# Patient Record
Sex: Female | Born: 1955
Health system: Southern US, Community
[De-identification: ages and names within clinical notes are randomized; demographics above are authoritative.]

## PROBLEM LIST (undated history)

## (undated) DIAGNOSIS — R300 Dysuria: Secondary | ICD-10-CM

## (undated) DIAGNOSIS — R3915 Urgency of urination: Secondary | ICD-10-CM

## (undated) DIAGNOSIS — H9190 Unspecified hearing loss, unspecified ear: Secondary | ICD-10-CM

## (undated) DIAGNOSIS — M1990 Unspecified osteoarthritis, unspecified site: Secondary | ICD-10-CM

## (undated) DIAGNOSIS — E079 Disorder of thyroid, unspecified: Secondary | ICD-10-CM

## (undated) DIAGNOSIS — IMO0002 Reserved for concepts with insufficient information to code with codable children: Secondary | ICD-10-CM

## (undated) DIAGNOSIS — N952 Postmenopausal atrophic vaginitis: Secondary | ICD-10-CM

## (undated) DIAGNOSIS — E785 Hyperlipidemia, unspecified: Secondary | ICD-10-CM

## (undated) DIAGNOSIS — E119 Type 2 diabetes mellitus without complications: Secondary | ICD-10-CM

## (undated) DIAGNOSIS — K589 Irritable bowel syndrome without diarrhea: Secondary | ICD-10-CM

## (undated) DIAGNOSIS — M810 Age-related osteoporosis without current pathological fracture: Secondary | ICD-10-CM

## (undated) DIAGNOSIS — R112 Nausea with vomiting, unspecified: Secondary | ICD-10-CM

## (undated) DIAGNOSIS — Z9889 Other specified postprocedural states: Secondary | ICD-10-CM

## (undated) DIAGNOSIS — E039 Hypothyroidism, unspecified: Secondary | ICD-10-CM

## (undated) DIAGNOSIS — R350 Frequency of micturition: Secondary | ICD-10-CM

## (undated) DIAGNOSIS — J302 Other seasonal allergic rhinitis: Secondary | ICD-10-CM

## (undated) DIAGNOSIS — N8189 Other female genital prolapse: Secondary | ICD-10-CM

## (undated) DIAGNOSIS — N950 Postmenopausal bleeding: Secondary | ICD-10-CM

## (undated) HISTORY — PX: KNEE ARTHROSCOPY: SHX127

## (undated) HISTORY — DX: Postmenopausal bleeding: N95.0

## (undated) HISTORY — PX: CARPAL TUNNEL RELEASE: SHX101

## (undated) HISTORY — DX: Age-related osteoporosis without current pathological fracture: M81.0

## (undated) HISTORY — DX: Other female genital prolapse: N81.89

## (undated) HISTORY — PX: COLONOSCOPY: SHX174

## (undated) HISTORY — PX: EYE SURGERY: SHX253

## (undated) HISTORY — DX: Type 2 diabetes mellitus without complications: E11.9

## (undated) HISTORY — DX: Reserved for concepts with insufficient information to code with codable children: IMO0002

## (undated) HISTORY — DX: Postmenopausal atrophic vaginitis: N95.2

## (undated) HISTORY — DX: Frequency of micturition: R35.0

## (undated) HISTORY — PX: TUBAL LIGATION: SHX77

## (undated) HISTORY — PX: WISDOM TOOTH EXTRACTION: SHX21

## (undated) HISTORY — DX: Urgency of urination: R39.15

## (undated) HISTORY — DX: Disorder of thyroid, unspecified: E07.9

## (undated) HISTORY — DX: Dysuria: R30.0

## (undated) HISTORY — PX: ABDOMINAL HYSTERECTOMY: SHX81

---

## 1982-12-05 HISTORY — PX: OTHER SURGICAL HISTORY: SHX169

## 1998-06-19 ENCOUNTER — Other Ambulatory Visit: Admission: RE | Admit: 1998-06-19 | Discharge: 1998-06-19 | Payer: Self-pay | Admitting: Obstetrics & Gynecology

## 1998-06-25 ENCOUNTER — Ambulatory Visit (HOSPITAL_COMMUNITY): Admission: RE | Admit: 1998-06-25 | Discharge: 1998-06-25 | Payer: Self-pay | Admitting: Orthopedic Surgery

## 1998-06-29 ENCOUNTER — Other Ambulatory Visit: Admission: RE | Admit: 1998-06-29 | Discharge: 1998-06-29 | Payer: Self-pay | Admitting: Obstetrics & Gynecology

## 1998-06-29 HISTORY — PX: ENDOMETRIAL BIOPSY: SHX622

## 1999-08-03 ENCOUNTER — Other Ambulatory Visit: Admission: RE | Admit: 1999-08-03 | Discharge: 1999-08-03 | Payer: Self-pay | Admitting: Obstetrics & Gynecology

## 1999-08-18 ENCOUNTER — Encounter: Payer: Self-pay | Admitting: Obstetrics & Gynecology

## 1999-08-18 ENCOUNTER — Ambulatory Visit (HOSPITAL_COMMUNITY): Admission: RE | Admit: 1999-08-18 | Discharge: 1999-08-18 | Payer: Self-pay | Admitting: Obstetrics & Gynecology

## 1999-12-21 ENCOUNTER — Other Ambulatory Visit: Admission: RE | Admit: 1999-12-21 | Discharge: 1999-12-21 | Payer: Self-pay | Admitting: Obstetrics & Gynecology

## 2000-07-31 ENCOUNTER — Encounter: Payer: Self-pay | Admitting: Obstetrics & Gynecology

## 2000-07-31 ENCOUNTER — Ambulatory Visit (HOSPITAL_COMMUNITY): Admission: RE | Admit: 2000-07-31 | Discharge: 2000-07-31 | Payer: Self-pay | Admitting: *Deleted

## 2001-08-02 ENCOUNTER — Encounter: Payer: Self-pay | Admitting: Obstetrics and Gynecology

## 2001-08-02 ENCOUNTER — Ambulatory Visit (HOSPITAL_COMMUNITY): Admission: RE | Admit: 2001-08-02 | Discharge: 2001-08-02 | Payer: Self-pay | Admitting: Obstetrics and Gynecology

## 2001-09-18 ENCOUNTER — Other Ambulatory Visit: Admission: RE | Admit: 2001-09-18 | Discharge: 2001-09-18 | Payer: Self-pay | Admitting: Obstetrics and Gynecology

## 2003-08-28 ENCOUNTER — Other Ambulatory Visit: Admission: RE | Admit: 2003-08-28 | Discharge: 2003-08-28 | Payer: Self-pay | Admitting: Obstetrics and Gynecology

## 2003-09-01 ENCOUNTER — Encounter: Payer: Self-pay | Admitting: Obstetrics and Gynecology

## 2003-09-01 ENCOUNTER — Ambulatory Visit (HOSPITAL_COMMUNITY): Admission: RE | Admit: 2003-09-01 | Discharge: 2003-09-01 | Payer: Self-pay | Admitting: Obstetrics and Gynecology

## 2004-09-14 ENCOUNTER — Ambulatory Visit (HOSPITAL_COMMUNITY): Admission: RE | Admit: 2004-09-14 | Discharge: 2004-09-14 | Payer: Self-pay | Admitting: Family Medicine

## 2005-05-03 ENCOUNTER — Other Ambulatory Visit: Admission: RE | Admit: 2005-05-03 | Discharge: 2005-05-03 | Payer: Self-pay | Admitting: Obstetrics and Gynecology

## 2005-09-27 ENCOUNTER — Ambulatory Visit (HOSPITAL_COMMUNITY): Admission: RE | Admit: 2005-09-27 | Discharge: 2005-09-27 | Payer: Self-pay | Admitting: Family Medicine

## 2006-05-04 ENCOUNTER — Other Ambulatory Visit: Admission: RE | Admit: 2006-05-04 | Discharge: 2006-05-04 | Payer: Self-pay | Admitting: Obstetrics and Gynecology

## 2006-09-28 ENCOUNTER — Ambulatory Visit (HOSPITAL_COMMUNITY): Admission: RE | Admit: 2006-09-28 | Discharge: 2006-09-28 | Payer: Self-pay | Admitting: Family Medicine

## 2006-10-13 ENCOUNTER — Encounter: Admission: RE | Admit: 2006-10-13 | Discharge: 2006-10-13 | Payer: Self-pay | Admitting: Family Medicine

## 2006-10-13 IMAGING — MG MM DIAGNOSTIC LTD LEFT
3 series · 3 of 3 positions shown · non-contrast
Comparison: Previous examinations, including the screening mammogram dated [DATE].

[REDACTED] LEFT
CC and MLO view(s) were taken of the left breast.

DIGITAL LIMITED LEFT DIAGNOSTIC MAMMOGRAM:
CLINICAL DATA: Possible left breast mass at recent screening mammography.

[L CC (1 of 2)]
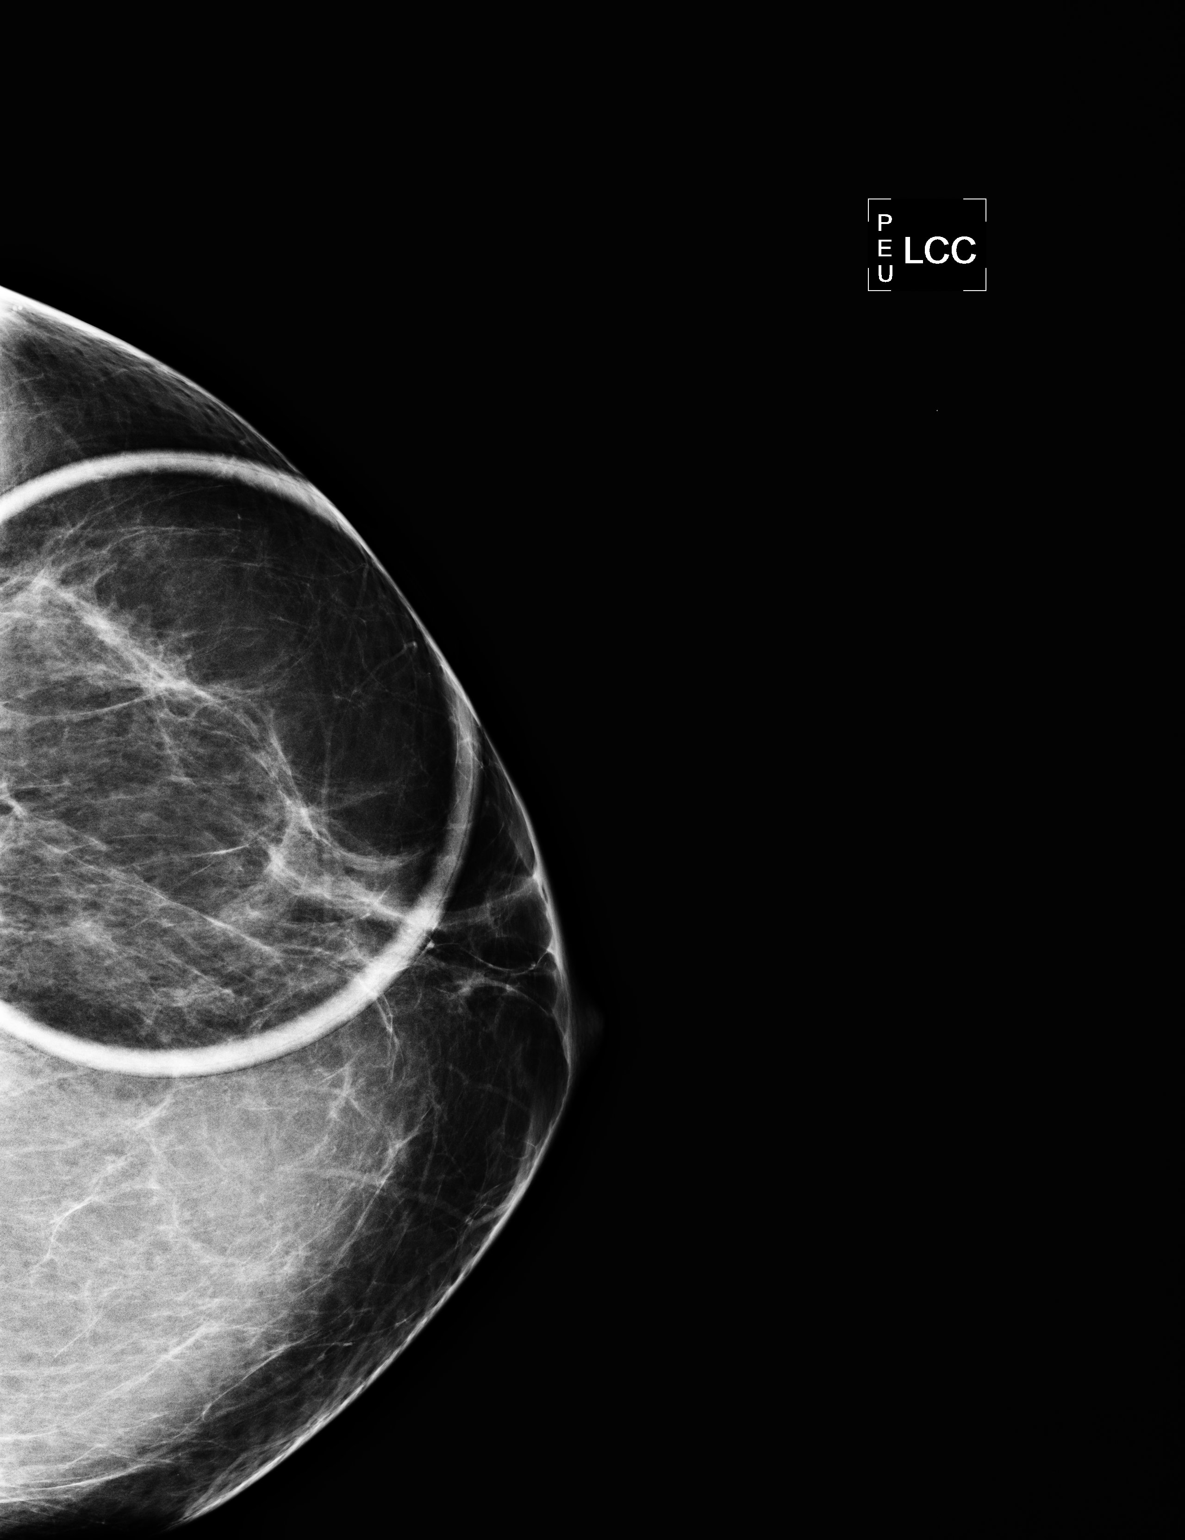

[L MLO]
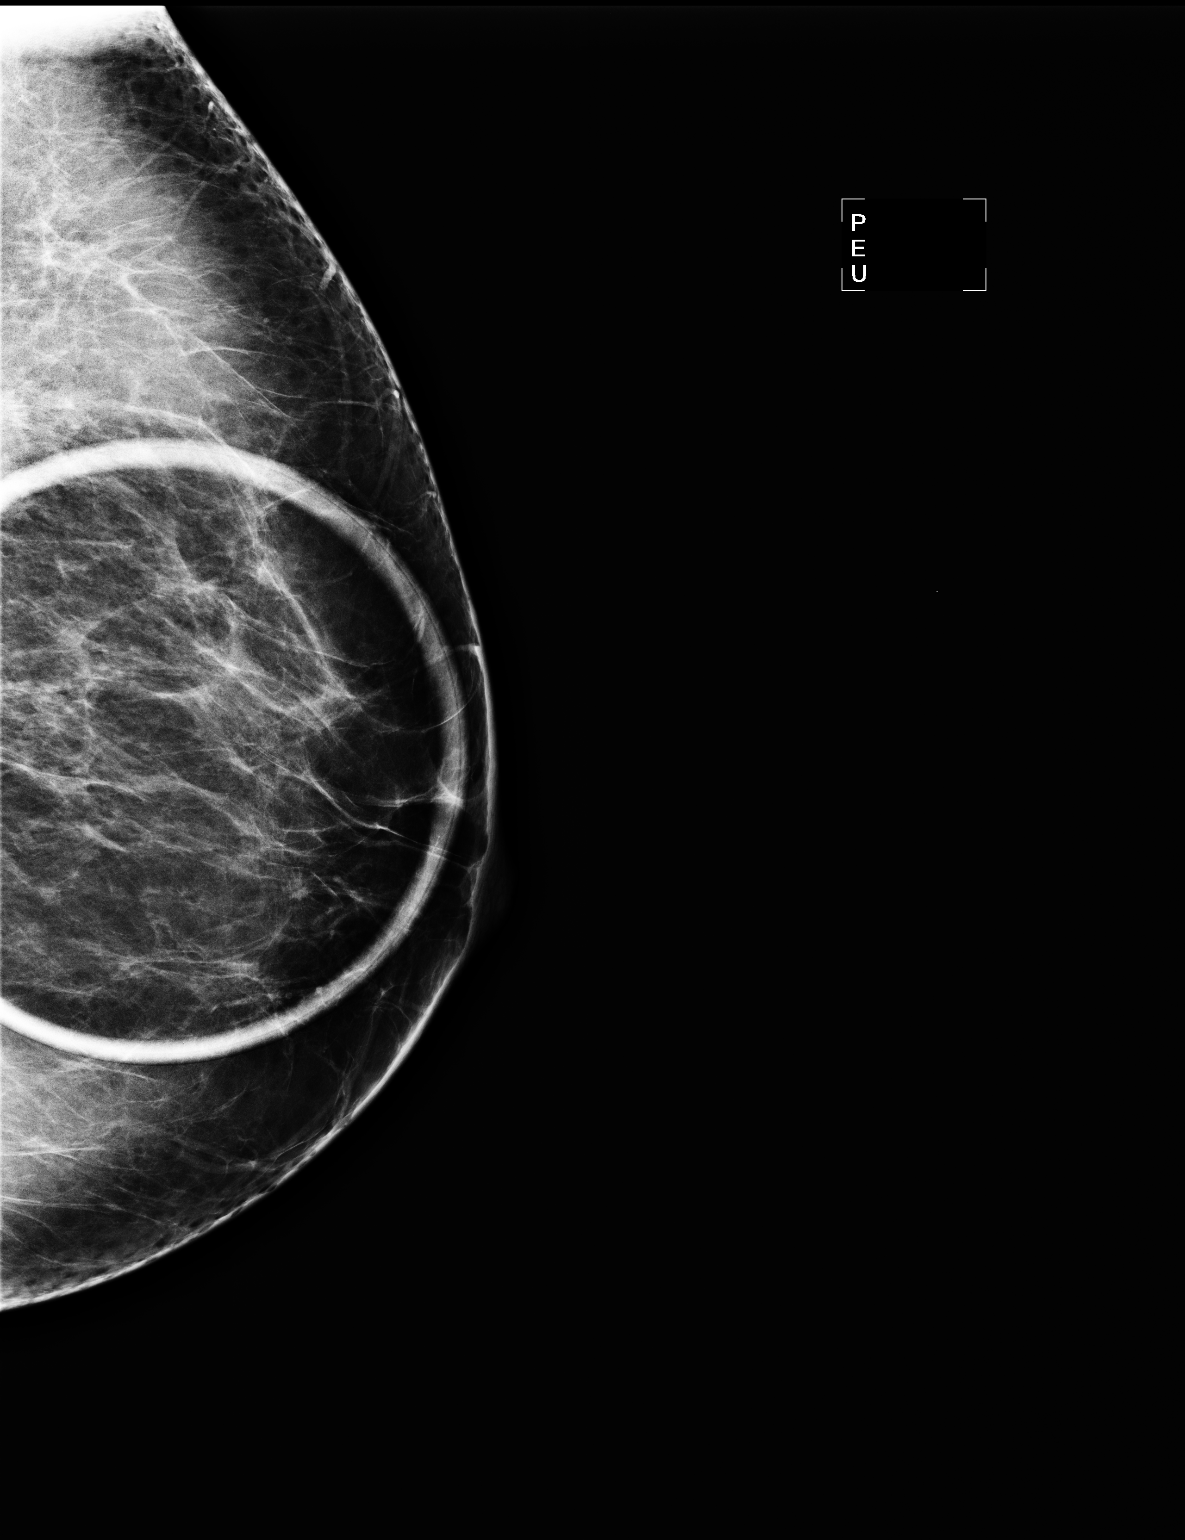

[L CC (2 of 2)]
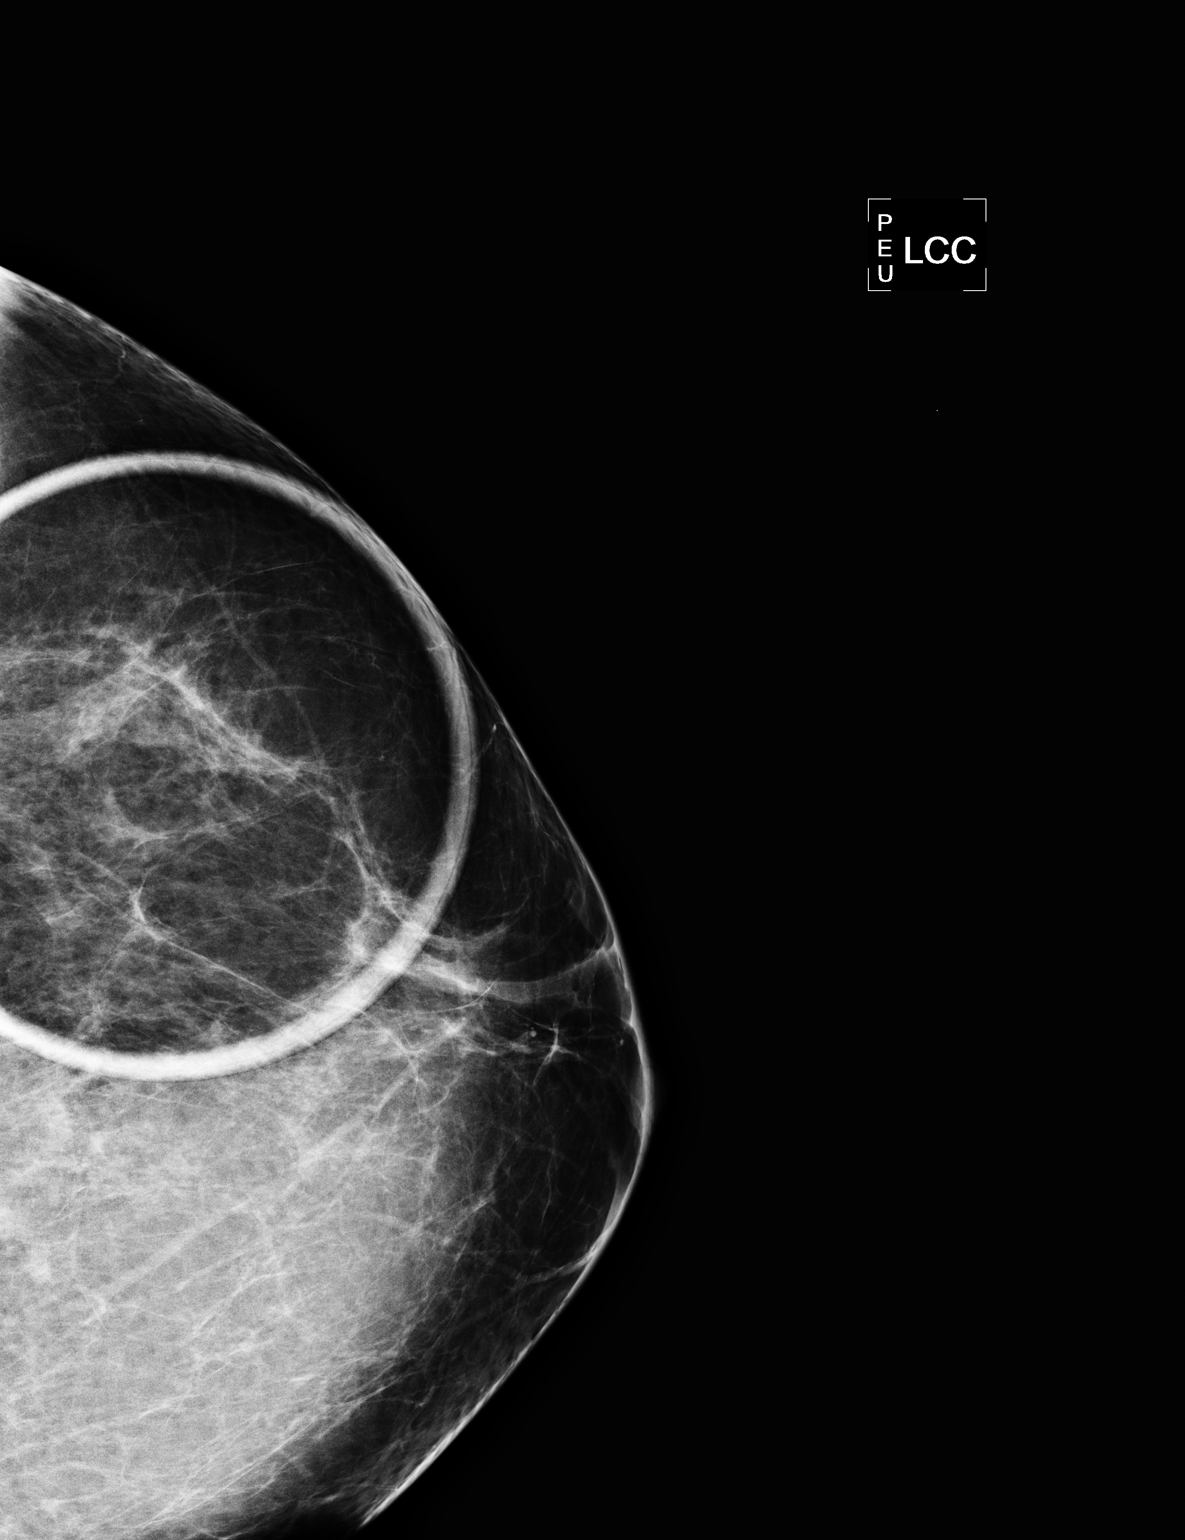

[3 of 3 positions shown; findings below may reference images not displayed]

Spot compression craniocaudal and spot  compression oblique views of the left breast demonstrate 
normal appearing parenchyma at the location of the recently suspected mass, unchanged from previous
examinations.  No true mass or other findings suspicious for malignancy.
IMPRESSION: No evidence of malignancy.  The recently suspected left breast mass represented overlapping of 
normal breast tissue.  Screening mammography is recommended in one year.

ASSESSMENT: Negative - BI-RADS 1

Screening mammogram of both breasts in 1 year.
, THIS PROCEDURE WAS A DIGITAL MAMMOGRAM.

## 2006-10-22 ENCOUNTER — Emergency Department (HOSPITAL_COMMUNITY): Admission: EM | Admit: 2006-10-22 | Discharge: 2006-10-22 | Payer: Self-pay | Admitting: Emergency Medicine

## 2006-10-22 IMAGING — CR DG CHEST 1V PORT
1 series · 1 of 1 positions shown · non-contrast
Comparison: none

CLINICAL DATA: Chest pain.  Palpitations and tachycardia.
 PORTABLE CHEST - 1 VIEW:

[view not recorded]
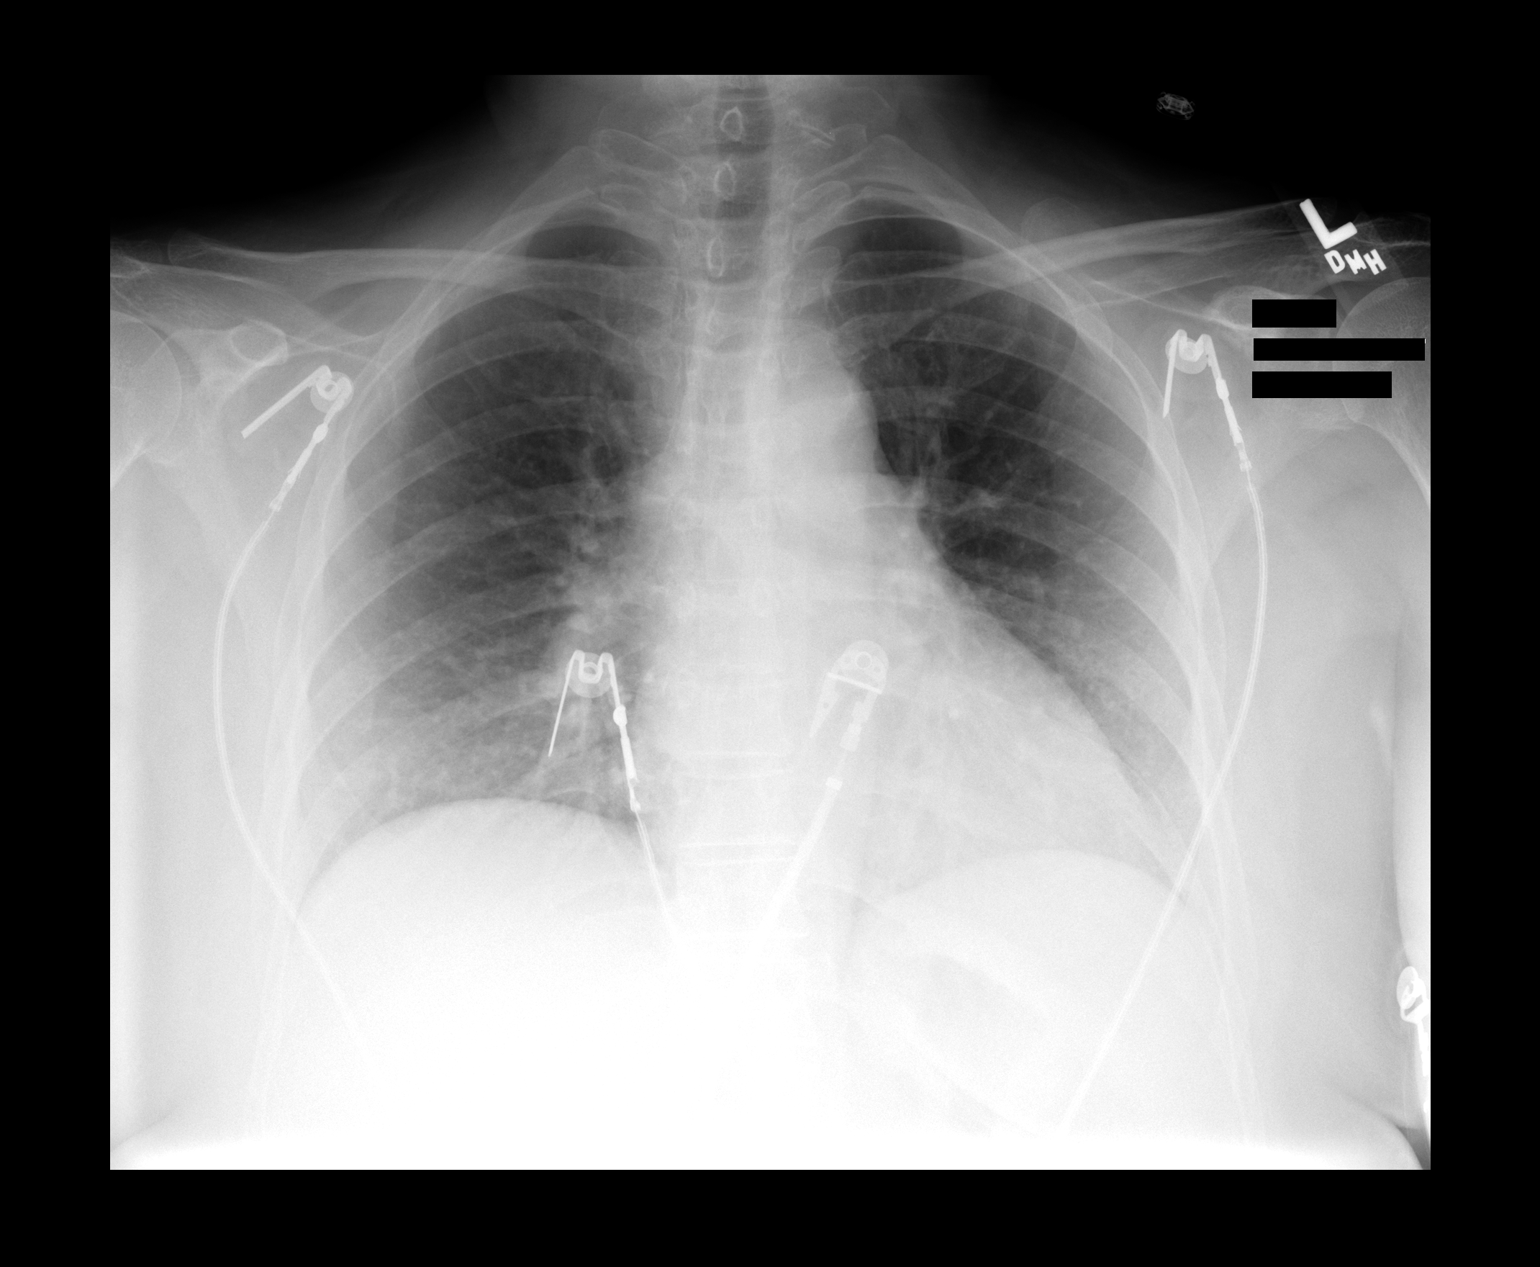

[1 of 1 positions shown; findings below may reference images not displayed]

FINDINGS: The heart size and mediastinal contours are within normal limits.  Both lungs are clear.
IMPRESSION: No acute findings.

## 2007-10-03 ENCOUNTER — Encounter: Admission: RE | Admit: 2007-10-03 | Discharge: 2007-10-03 | Payer: Self-pay | Admitting: Family Medicine

## 2007-10-03 IMAGING — MG MM SCREENING MAMMOGRAM
5 series · 5 of 5 positions shown · non-contrast
Comparison: none

DG SCREEN MAMMOGRAM BILATERAL
Bilateral CC and MLO view(s) were taken.

DIGITAL SCREENING MAMMOGRAM WITH CAD:
There are scattered fibroglandular densities.  No masses or malignant type calcifications are 
identified.  Compared with prior studies.

[R CC]
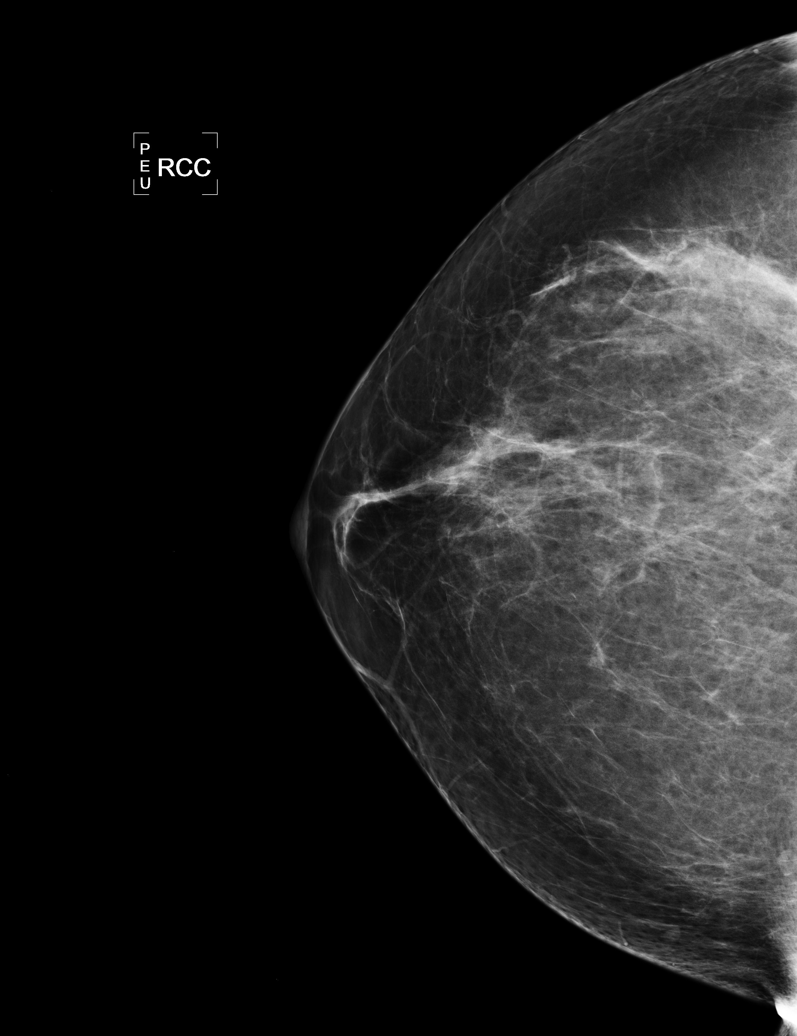

[L CC]
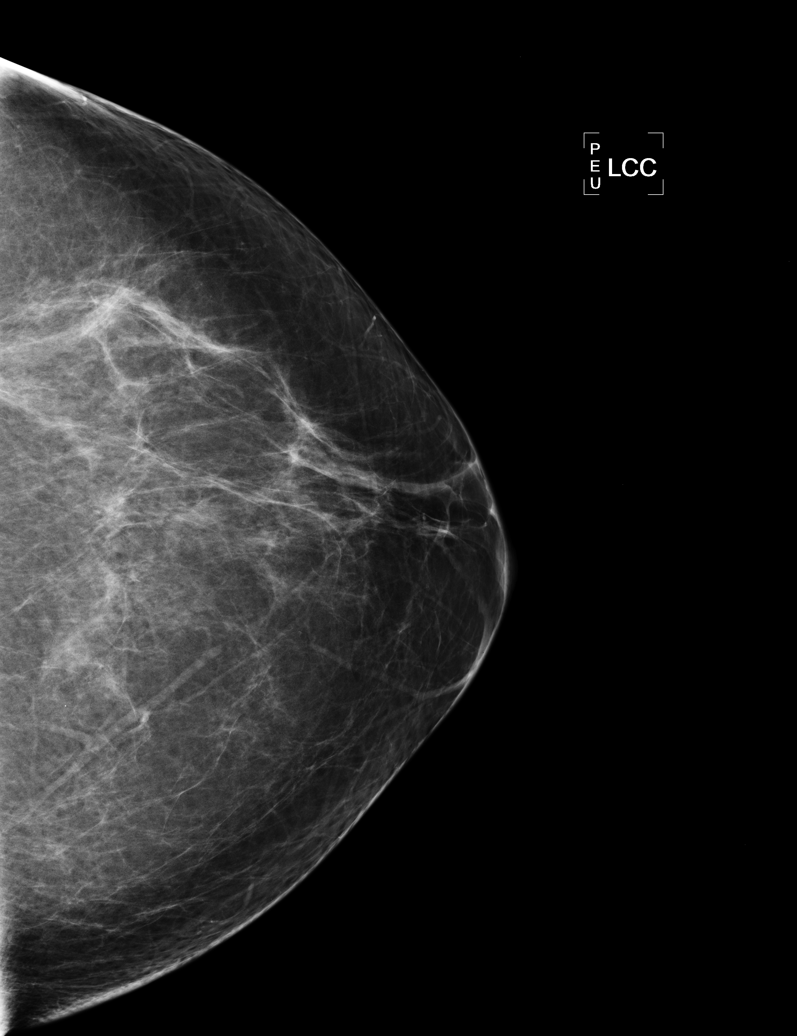

[L MLO (1 of 2)]
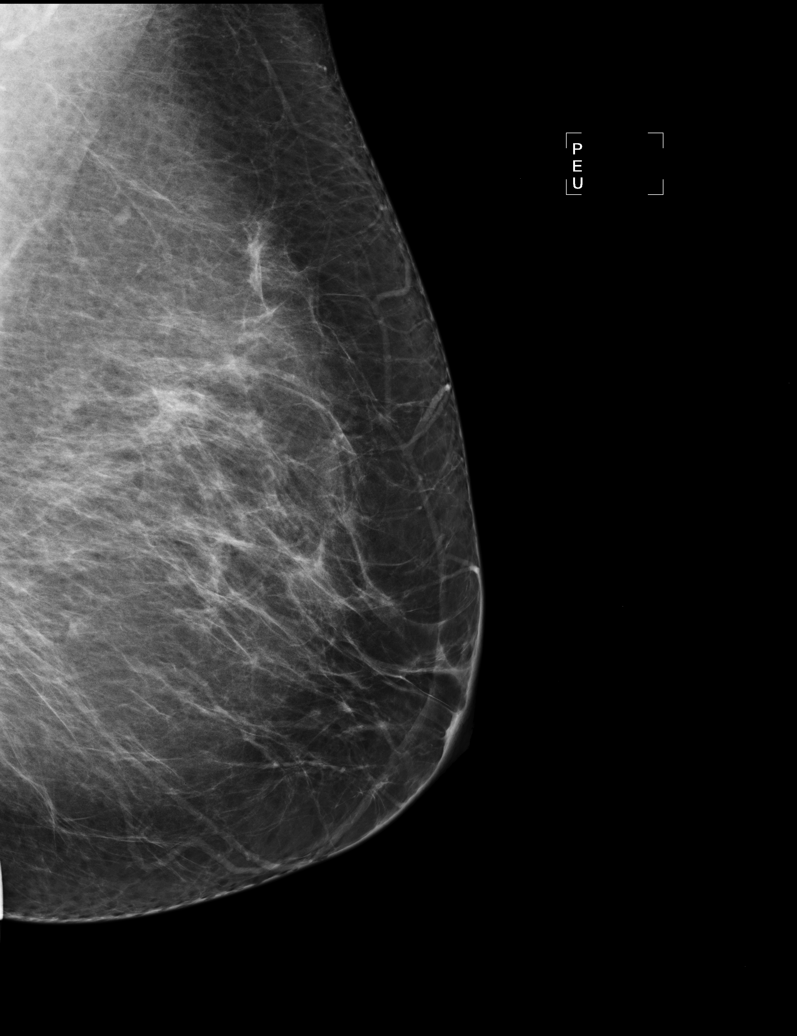

[R MLO]
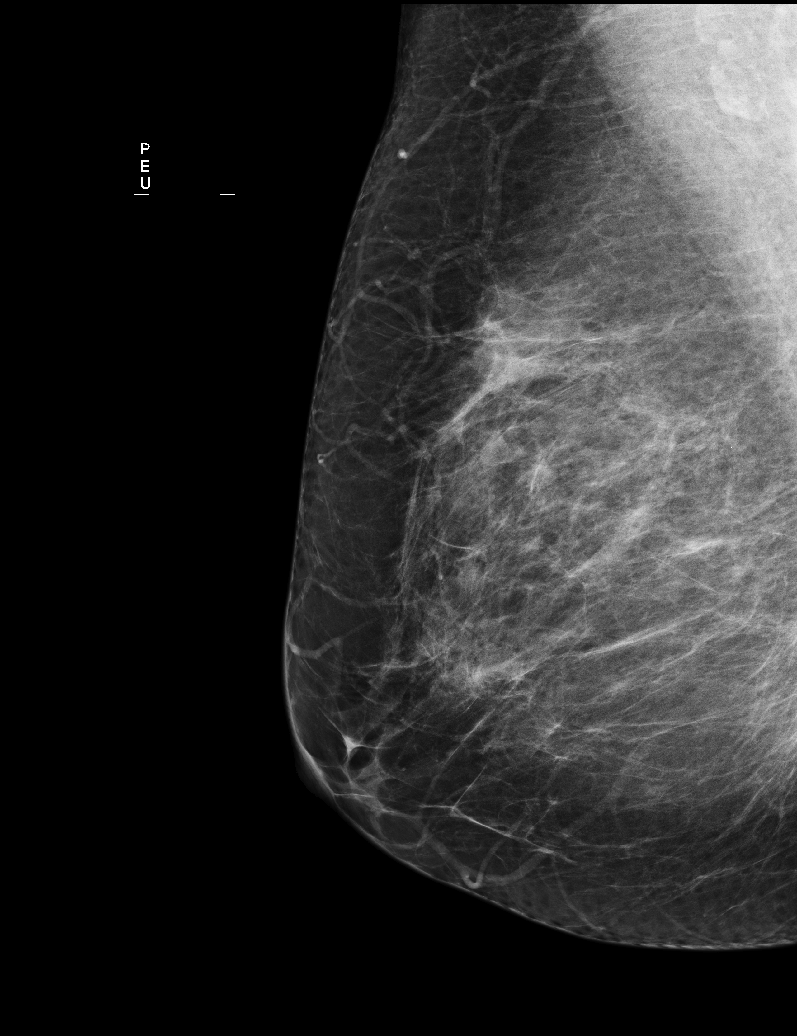

[L MLO (2 of 2)]
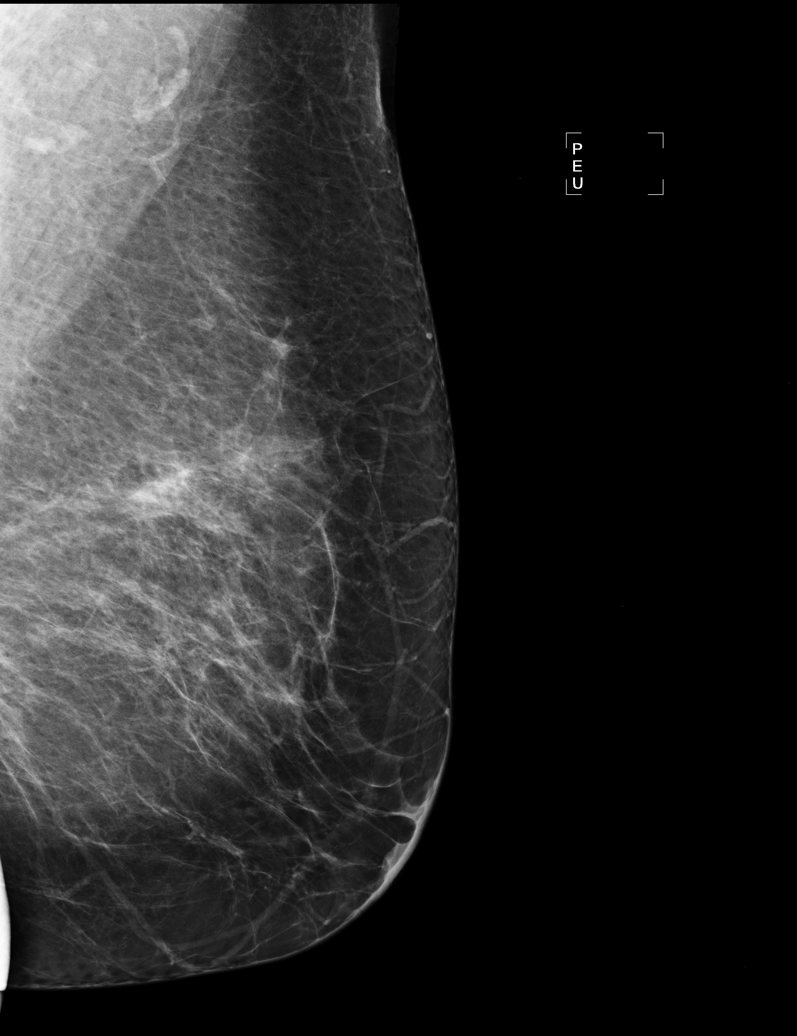

[5 of 5 positions shown; findings below may reference images not displayed]

IMPRESSION: No specific mammographic evidence of malignancy.  Next screening mammogram is recommended in one 
year.

ASSESSMENT: Negative - BI-RADS 1

Screening mammogram in 1 year.
ANALYZED BY COMPUTER AIDED DETECTION. , THIS PROCEDURE WAS A DIGITAL MAMMOGRAM.

## 2008-10-06 ENCOUNTER — Encounter: Admission: RE | Admit: 2008-10-06 | Discharge: 2008-10-06 | Payer: Self-pay | Admitting: Family Medicine

## 2009-12-03 ENCOUNTER — Encounter: Admission: RE | Admit: 2009-12-03 | Discharge: 2009-12-03 | Payer: Self-pay | Admitting: Family Medicine

## 2010-12-13 ENCOUNTER — Encounter
Admission: RE | Admit: 2010-12-13 | Discharge: 2010-12-13 | Payer: Self-pay | Source: Home / Self Care | Attending: Family Medicine | Admitting: Family Medicine

## 2010-12-26 ENCOUNTER — Encounter: Payer: Self-pay | Admitting: Family Medicine

## 2011-12-12 ENCOUNTER — Other Ambulatory Visit: Payer: Self-pay | Admitting: Obstetrics and Gynecology

## 2012-01-11 ENCOUNTER — Encounter (HOSPITAL_COMMUNITY): Payer: Self-pay | Admitting: Pharmacist

## 2012-01-19 ENCOUNTER — Encounter (HOSPITAL_COMMUNITY): Payer: Self-pay

## 2012-01-19 ENCOUNTER — Other Ambulatory Visit (HOSPITAL_COMMUNITY): Payer: 59

## 2012-01-19 ENCOUNTER — Encounter (HOSPITAL_COMMUNITY)
Admission: RE | Admit: 2012-01-19 | Discharge: 2012-01-19 | Disposition: A | Discharge status: Home / Self Care | Payer: BC Managed Care – PPO | Source: Ambulatory Visit | Attending: Obstetrics and Gynecology | Admitting: Obstetrics and Gynecology

## 2012-01-19 HISTORY — DX: Unspecified osteoarthritis, unspecified site: M19.90

## 2012-01-19 HISTORY — DX: Nausea with vomiting, unspecified: R11.2

## 2012-01-19 HISTORY — DX: Hypothyroidism, unspecified: E03.9

## 2012-01-19 HISTORY — DX: Nausea with vomiting, unspecified: Z98.890

## 2012-01-19 HISTORY — DX: Irritable bowel syndrome, unspecified: K58.9

## 2012-01-19 HISTORY — DX: Hyperlipidemia, unspecified: E78.5

## 2012-01-19 HISTORY — DX: Other seasonal allergic rhinitis: J30.2

## 2012-01-19 LAB — URINALYSIS, ROUTINE W REFLEX MICROSCOPIC
Bilirubin Urine: NEGATIVE
Glucose, UA: NEGATIVE mg/dL
Ketones, ur: NEGATIVE mg/dL
Nitrite: NEGATIVE
Protein, ur: NEGATIVE mg/dL
pH: 6 (ref 5.0–8.0)

## 2012-01-19 LAB — CBC
Hemoglobin: 14 g/dL (ref 12.0–15.0)
MCHC: 32.4 g/dL (ref 30.0–36.0)
Platelets: 267 10*3/uL (ref 150–400)

## 2012-01-19 LAB — URINE MICROSCOPIC-ADD ON

## 2012-01-19 NOTE — Patient Instructions (Signed)
   Your procedure is scheduled on: Thursday, Feb 21st  Enter through the [employer] [facility] at: 7:30am Pick up the phone at the desk and dial [phone] and inform us of your arrival.  Please call this number if you have any problems the morning of surgery: [phone]  Remember: Do not eat food after midnight: Wednesday Do not drink clear liquids after: Wednesday Take these medicines the morning of surgery with a SIP OF WATER: Per Anesthesia Instructions  Do not wear jewelry, make-up, or FINGER nail polish Do not wear lotions, powders, perfumes or deodorant. Do not shave 48 hours prior to surgery. Do not bring valuables to the hospital.  Leave suitcase in the car. After Surgery it may be brought to your room. For patients being admitted to the hospital, checkout time is 11:00am the day of discharge.  Home with Husband Tammy Hernandez  cell  [phone].  Patients discharged on the day of surgery will not be allowed to drive home.     Remember to use your hibiclens as instructed.Please shower with 1/2 bottle the evening before your surgery and the other 1/2 bottle the morning of surgery.

## 2012-01-20 NOTE — Pre-Procedure Instructions (Signed)
Add Vaginal Hysterectomy to consent per Adrianne/Dr. Stefano Gaul. Unable to add via EPIC at this time.  Patton Salles RN

## 2012-01-20 NOTE — H&P (Signed)
[name], Hernandez NO.:  [id number]  MEDICAL RECORD NO.:  [id number]  LOCATION:  [facility]                           FACILITY:  [facility]  PHYSICIAN:  Janine Limbo, M.D.DATE OF BIRTH:  19-Jul-1956  DATE OF ADMISSION:  01/19/2012 DATE OF DISCHARGE:  01/19/2012                             HISTORY & PHYSICAL   DATE FOR SURGERY:  January 26, 2012.  HISTORY OF PRESENT ILLNESS:  Tammy Hernandez is a 56 year old female, para 1-0- 0-1, who presents for a vaginal hysterectomy with anterior and posterior colporrhaphy, tension-free vaginal tape placement, and cystoscopy.  The patient has been followed at the [facility] and Gynecology Division at [facility].  The patient complained of worsening stress urinary incontinence.  Her exam shows pelvic relaxation with loss of the urethrovesical angle.  She has a large cystocele and a moderate rectocele.  She has uterine descent.  She does not have an enterocele.  An endometrial biopsy was benign.  An ultrasound showed a normal-sized uterus.  No ovarian masses were appreciated.  Her urine culture was negative.  The patient has been treated with ciprofloxacin.  DRUG ALLERGIES:  The patient was told as a child that she was allergic to SULFA MEDICATIONS, but she does not know of her reaction.  OBSTETRICAL HISTORY:  The patient has had 1 term vaginal delivery.  PAST MEDICAL HISTORY:  The patient has a history of hypothyroidism, hypercholesterolemia, and irritable bowel syndrome.  She also has a history of osteoporosis.  She has had a tubal ligation.  She has a history of low vitamin D.  CURRENT MEDICATIONS: 1. Diphenoxylate/atropine. 2. Levothyroxine 75 mcg each day. 3. Simvastatin 20 mg. 4. Dicyclomine, which I think is Bentyl. 5. Vitamin D. 6. Multivitamins.  SOCIAL HISTORY:  The patient denies cigarette use, alcohol use, and recreational drug use.  REVIEW OF SYSTEMS:  Please see history of  present illness.  FAMILY HISTORY:  Noncontributory.  PHYSICAL EXAMINATION:  VITAL SIGNS:  Weight is 175 pounds.  Her height is 5 feet, 7 inches. HEENT:  Within normal limits. CHEST:  Clear. HEART:  Regular rate and rhythm. BREASTS:  Without masses. ABDOMEN:  Nontender. EXTREMITIES:  Grossly normal. NEUROLOGIC:  Exam is grossly normal. PELVIC:  External genitalia is normal.  The vagina shows a cystocele and a rectocele with loss of urethrovesical angle.  The cervix descends 1/3 to 1/2 the length of the vagina.  The uterus is nontender and normal sized.  Adnexa, no masses and rectovaginal exam confirms.  ASSESSMENT: 1. Stress urinary incontinence. 2. Pelvic relaxation. 3. Hypercholesterolemia. 4. Hypothyroidism. 5. Irritable bowel syndrome. 6. Osteoporosis.  PLAN:  The patient will undergo a vaginal hysterectomy with anterior and posterior colporrhaphy.  She will also have a DVT placed using the TVT Advantage System.  We will also perform cystoscopy.  The patient understands the indications for her surgical procedure, and she accepts the risks of, but not limited to, anesthetic complications, bleeding, infections, and possible damage to the surrounding organs.  She understands the risks of mesh erosion.  She was given a copy of the Food and Drug Administration handout concerning the use of mesh for gynecologic  surgery.  She was also referred to the FDA website concerning the above.  She also understands that there is a risk of urinary retention that may require long-term catheterizations.     Janine Limbo, M.D.     AVS/MEDQ  D:  01/19/2012  T:  01/20/2012  Job:  [id number]

## 2012-01-25 MED ORDER — CEFAZOLIN SODIUM-DEXTROSE 2-3 GM-% IV SOLR
2.0000 g | INTRAVENOUS | Status: AC
  Administered 2012-01-26: 2 g via INTRAVENOUS
  Filled 2012-01-25: qty 50

## 2012-01-26 ENCOUNTER — Other Ambulatory Visit: Payer: Self-pay | Admitting: Obstetrics and Gynecology

## 2012-01-26 ENCOUNTER — Encounter (HOSPITAL_COMMUNITY)
Admission: RE | Disposition: A | Discharge status: Home / Self Care | Payer: Self-pay | Source: Ambulatory Visit | Attending: Obstetrics and Gynecology

## 2012-01-26 ENCOUNTER — Encounter (HOSPITAL_COMMUNITY): Payer: Self-pay | Admitting: Anesthesiology

## 2012-01-26 ENCOUNTER — Ambulatory Visit (HOSPITAL_COMMUNITY): Payer: BC Managed Care – PPO | Admitting: Anesthesiology

## 2012-01-26 ENCOUNTER — Inpatient Hospital Stay (HOSPITAL_COMMUNITY)
Admission: RE | Admit: 2012-01-26 | Discharge: 2012-01-28 | DRG: 359 | Disposition: A | Discharge status: Home / Self Care | Payer: BC Managed Care – PPO | Source: Ambulatory Visit | Attending: Obstetrics and Gynecology | Admitting: Obstetrics and Gynecology

## 2012-01-26 ENCOUNTER — Encounter (HOSPITAL_COMMUNITY): Payer: Self-pay | Admitting: *Deleted

## 2012-01-26 DIAGNOSIS — D259 Leiomyoma of uterus, unspecified: Secondary | ICD-10-CM | POA: Diagnosis present

## 2012-01-26 DIAGNOSIS — Z01818 Encounter for other preprocedural examination: Secondary | ICD-10-CM

## 2012-01-26 DIAGNOSIS — N393 Stress incontinence (female) (male): Secondary | ICD-10-CM | POA: Diagnosis present

## 2012-01-26 DIAGNOSIS — N812 Incomplete uterovaginal prolapse: Principal | ICD-10-CM | POA: Diagnosis present

## 2012-01-26 HISTORY — PX: BLADDER SUSPENSION: SHX72

## 2012-01-26 HISTORY — PX: ANTERIOR AND POSTERIOR REPAIR: SHX5121

## 2012-01-26 HISTORY — PX: VAGINAL HYSTERECTOMY: SHX2639

## 2012-01-26 SURGERY — ANTERIOR (CYSTOCELE) AND POSTERIOR REPAIR (RECTOCELE)
Anesthesia: General

## 2012-01-26 MED ORDER — NEOSTIGMINE METHYLSULFATE 1 MG/ML IJ SOLN
INTRAMUSCULAR | Status: DC | PRN
  Administered 2012-01-26: 2 mg via INTRAVENOUS

## 2012-01-26 MED ORDER — ESTRADIOL 0.1 MG/GM VA CREA
TOPICAL_CREAM | VAGINAL | Status: DC | PRN
  Administered 2012-01-26: 1 via VAGINAL

## 2012-01-26 MED ORDER — DIPHENHYDRAMINE HCL 12.5 MG/5ML PO ELIX: 12.5000 mg | ORAL_SOLUTION | Freq: Four times a day (QID) | ORAL | Status: DC | PRN

## 2012-01-26 MED ORDER — ESTRADIOL 0.1 MG/GM VA CREA
TOPICAL_CREAM | VAGINAL | Status: AC
  Filled 2012-01-26: qty 42.5

## 2012-01-26 MED ORDER — MENTHOL 3 MG MT LOZG: 1.0000 | LOZENGE | OROMUCOSAL | Status: DC | PRN

## 2012-01-26 MED ORDER — HYDROMORPHONE 0.3 MG/ML IV SOLN
INTRAVENOUS | Status: DC
  Administered 2012-01-26: 15:00:00 via INTRAVENOUS
  Administered 2012-01-26: 0.2 mg via INTRAVENOUS

## 2012-01-26 MED ORDER — GLYCOPYRROLATE 0.2 MG/ML IJ SOLN
INTRAMUSCULAR | Status: DC | PRN
  Administered 2012-01-26: .4 mg via INTRAVENOUS
  Administered 2012-01-26: 0.2 mg via INTRAVENOUS

## 2012-01-26 MED ORDER — ONDANSETRON HCL 4 MG/2ML IJ SOLN
INTRAMUSCULAR | Status: DC | PRN
  Administered 2012-01-26: 4 mg via INTRAVENOUS

## 2012-01-26 MED ORDER — LACTATED RINGERS IV SOLN
INTRAVENOUS | Status: DC
  Administered 2012-01-26 – 2012-01-27 (×3): via INTRAVENOUS

## 2012-01-26 MED ORDER — BUPIVACAINE-EPINEPHRINE 0.5% -1:200000 IJ SOLN
INTRAMUSCULAR | Status: DC | PRN
  Administered 2012-01-26: 10 mL

## 2012-01-26 MED ORDER — BUPIVACAINE-EPINEPHRINE (PF) 0.5% -1:200000 IJ SOLN
INTRAMUSCULAR | Status: DC | PRN
  Administered 2012-01-26: 30 mL

## 2012-01-26 MED ORDER — MEPERIDINE HCL 25 MG/ML IJ SOLN
6.2500 mg | INTRAMUSCULAR | Status: DC | PRN
Start: 2012-01-26 — End: 2012-01-26

## 2012-01-26 MED ORDER — MIDAZOLAM HCL 5 MG/5ML IJ SOLN
INTRAMUSCULAR | Status: DC | PRN
  Administered 2012-01-26: 2 mg via INTRAVENOUS

## 2012-01-26 MED ORDER — ESTRADIOL 0.1 MG/GM VA CREA: 0.5000 | TOPICAL_CREAM | VAGINAL | Status: DC

## 2012-01-26 MED ORDER — ROCURONIUM BROMIDE 100 MG/10ML IV SOLN
INTRAVENOUS | Status: DC | PRN
  Administered 2012-01-26: 30 mg via INTRAVENOUS
  Administered 2012-01-26: 10 mg via INTRAVENOUS

## 2012-01-26 MED ORDER — ONDANSETRON HCL 4 MG/2ML IJ SOLN
4.0000 mg | Freq: Four times a day (QID) | INTRAMUSCULAR | Status: DC | PRN
  Administered 2012-01-27 (×2): 4 mg via INTRAVENOUS
  Filled 2012-01-26 (×2): qty 2

## 2012-01-26 MED ORDER — NALOXONE HCL 0.4 MG/ML IJ SOLN: 0.4000 mg | INTRAMUSCULAR | Status: DC | PRN

## 2012-01-26 MED ORDER — HYDROMORPHONE HCL PF 1 MG/ML IJ SOLN
INTRAMUSCULAR | Status: DC | PRN
  Administered 2012-01-26: 1 mg via INTRAVENOUS
  Administered 2012-01-26 (×2): 0.5 mg via INTRAVENOUS

## 2012-01-26 MED ORDER — BUPIVACAINE-EPINEPHRINE (PF) 0.5% -1:200000 IJ SOLN
INTRAMUSCULAR | Status: AC
  Filled 2012-01-26: qty 10

## 2012-01-26 MED ORDER — HYDROCODONE-ACETAMINOPHEN 5-325 MG PO TABS: 1.0000 | ORAL_TABLET | ORAL | Status: DC | PRN

## 2012-01-26 MED ORDER — DICYCLOMINE HCL 10 MG PO CAPS
10.0000 mg | ORAL_CAPSULE | Freq: Three times a day (TID) | ORAL | Status: DC
  Administered 2012-01-26 – 2012-01-28 (×6): 10 mg via ORAL
  Filled 2012-01-26 (×9): qty 1

## 2012-01-26 MED ORDER — INDIGOTINDISULFONATE SODIUM 8 MG/ML IJ SOLN
INTRAMUSCULAR | Status: DC | PRN
  Administered 2012-01-26: 5 mL via INTRAVENOUS

## 2012-01-26 MED ORDER — FENTANYL CITRATE 0.05 MG/ML IJ SOLN
INTRAMUSCULAR | Status: DC | PRN
  Administered 2012-01-26: 100 ug via INTRAVENOUS

## 2012-01-26 MED ORDER — METOCLOPRAMIDE HCL 5 MG/ML IJ SOLN: 10.0000 mg | Freq: Once | INTRAMUSCULAR | Status: DC | PRN

## 2012-01-26 MED ORDER — LEVOTHYROXINE SODIUM 75 MCG PO TABS
75.0000 ug | ORAL_TABLET | Freq: Every day | ORAL | Status: DC
  Administered 2012-01-27 – 2012-01-28 (×2): 75 ug via ORAL
  Filled 2012-01-26 (×3): qty 1

## 2012-01-26 MED ORDER — KETOROLAC TROMETHAMINE 60 MG/2ML IM SOLN
INTRAMUSCULAR | Status: DC | PRN
  Administered 2012-01-26: 30 mg via INTRAMUSCULAR

## 2012-01-26 MED ORDER — BUPIVACAINE HCL (PF) 0.25 % IJ SOLN
INTRAMUSCULAR | Status: AC
  Filled 2012-01-26: qty 30

## 2012-01-26 MED ORDER — IBUPROFEN 600 MG PO TABS
600.0000 mg | ORAL_TABLET | Freq: Four times a day (QID) | ORAL | Status: DC
  Administered 2012-01-26 – 2012-01-28 (×5): 600 mg via ORAL
  Filled 2012-01-26 (×6): qty 1

## 2012-01-26 MED ORDER — BACITRACIN-NEOMYCIN-POLYMYXIN 400-5-5000 EX OINT
TOPICAL_OINTMENT | CUTANEOUS | Status: AC
  Filled 2012-01-26: qty 1

## 2012-01-26 MED ORDER — SCOPOLAMINE 1 MG/3DAYS TD PT72
1.0000 | MEDICATED_PATCH | TRANSDERMAL | Status: DC
  Administered 2012-01-26: 1.5 mg via TRANSDERMAL
  Filled 2012-01-26: qty 1

## 2012-01-26 MED ORDER — KETOROLAC TROMETHAMINE 30 MG/ML IJ SOLN
INTRAMUSCULAR | Status: DC | PRN
  Administered 2012-01-26: 30 mg via INTRAVENOUS

## 2012-01-26 MED ORDER — HYDROMORPHONE 0.3 MG/ML IV SOLN
INTRAVENOUS | Status: AC
  Filled 2012-01-26: qty 25

## 2012-01-26 MED ORDER — LACTATED RINGERS IV SOLN
INTRAVENOUS | Status: DC
  Administered 2012-01-26 (×3): via INTRAVENOUS

## 2012-01-26 MED ORDER — SODIUM CHLORIDE 0.9 % IJ SOLN: 9.0000 mL | INTRAMUSCULAR | Status: DC | PRN

## 2012-01-26 MED ORDER — PROPOFOL 10 MG/ML IV EMUL
INTRAVENOUS | Status: DC | PRN
  Administered 2012-01-26: 50 mg via INTRAVENOUS
  Administered 2012-01-26: 150 mg via INTRAVENOUS
  Administered 2012-01-26: 100 mg via INTRAVENOUS
  Administered 2012-01-26: 50 mg via INTRAVENOUS

## 2012-01-26 MED ORDER — DEXAMETHASONE SODIUM PHOSPHATE 10 MG/ML IJ SOLN
INTRAMUSCULAR | Status: DC | PRN
  Administered 2012-01-26: 10 mg via INTRAVENOUS

## 2012-01-26 MED ORDER — DIPHENHYDRAMINE HCL 50 MG/ML IJ SOLN: 12.5000 mg | Freq: Four times a day (QID) | INTRAMUSCULAR | Status: DC | PRN

## 2012-01-26 MED ORDER — DIPHENOXYLATE-ATROPINE 2.5-0.025 MG PO TABS
1.0000 | ORAL_TABLET | Freq: Two times a day (BID) | ORAL | Status: DC
  Administered 2012-01-26 – 2012-01-28 (×4): 1 via ORAL
  Filled 2012-01-26 (×4): qty 1

## 2012-01-26 MED ORDER — SIMVASTATIN 20 MG PO TABS
20.0000 mg | ORAL_TABLET | Freq: Every evening | ORAL | Status: DC
  Administered 2012-01-26 – 2012-01-27 (×2): 20 mg via ORAL
  Filled 2012-01-26 (×3): qty 1

## 2012-01-26 MED ORDER — VASOPRESSIN 20 UNIT/ML IJ SOLN
INTRAMUSCULAR | Status: AC
  Filled 2012-01-26: qty 1

## 2012-01-26 MED ORDER — LIDOCAINE HCL (CARDIAC) 20 MG/ML IV SOLN
INTRAVENOUS | Status: DC | PRN
  Administered 2012-01-26: 40 mg via INTRAVENOUS

## 2012-01-26 SURGICAL SUPPLY — 56 items
ADH SKN CLS LQ APL DERMABOND (GAUZE/BANDAGES/DRESSINGS)
BLADE SURG 11 STRL SS (BLADE) ×2 IMPLANT
BLADE SURG 15 STRL LF C SS BP (BLADE) ×3 IMPLANT
BLADE SURG 15 STRL SS (BLADE) ×6
CANISTER SUCTION 2500CC (MISCELLANEOUS) ×2 IMPLANT
CATH BONANNO SUPRAPUBIC 14G (CATHETERS) IMPLANT
CATH FOLEY 2WAY  5CC 16FR SIL (CATHETERS) ×1
CATH FOLEY 2WAY 5CC 16FR SIL (CATHETERS) ×1 IMPLANT
CATH FOLEY 2WAY SLVR  5CC 18FR (CATHETERS)
CATH FOLEY 2WAY SLVR 5CC 18FR (CATHETERS) IMPLANT
CLOTH BEACON ORANGE TIMEOUT ST (SAFETY) ×2 IMPLANT
CONT PATH 16OZ SNAP LID 3702 (MISCELLANEOUS) IMPLANT
DECANTER SPIKE VIAL GLASS SM (MISCELLANEOUS) IMPLANT
DERMABOND ADHESIVE PROPEN (GAUZE/BANDAGES/DRESSINGS)
DERMABOND ADVANCED .7 DNX6 (GAUZE/BANDAGES/DRESSINGS) IMPLANT
DILATOR CANAL MILEX (MISCELLANEOUS) ×1 IMPLANT
DRAPE CAMERA CLOSED 9X96 (DRAPES) ×2 IMPLANT
DRAPE PROXIMA HALF (DRAPES) ×2 IMPLANT
DRSG COVADERM PLUS 2X2 (GAUZE/BANDAGES/DRESSINGS) ×4 IMPLANT
GAUZE SPONGE 4X4 12PLY STRL LF (GAUZE/BANDAGES/DRESSINGS) ×2 IMPLANT
GAUZE SPONGE 4X4 16PLY XRAY LF (GAUZE/BANDAGES/DRESSINGS) IMPLANT
GLOVE BIOGEL PI IND STRL 6.5 (GLOVE) ×1 IMPLANT
GLOVE BIOGEL PI IND STRL 8.5 (GLOVE) ×1 IMPLANT
GLOVE BIOGEL PI INDICATOR 6.5 (GLOVE) ×1
GLOVE BIOGEL PI INDICATOR 8.5 (GLOVE) ×1
GLOVE ECLIPSE 8.0 STRL XLNG CF (GLOVE) ×4 IMPLANT
GOWN PREVENTION PLUS LG XLONG (DISPOSABLE) ×6 IMPLANT
GOWN STRL REIN XL XLG (GOWN DISPOSABLE) ×2 IMPLANT
NDL SPNL 22GX3.5 QUINCKE BK (NEEDLE) IMPLANT
NEEDLE HYPO 22GX1.5 SAFETY (NEEDLE) IMPLANT
NEEDLE MAYO .5 CIRCLE (NEEDLE) ×2 IMPLANT
NEEDLE SPNL 22GX3.5 QUINCKE BK (NEEDLE) IMPLANT
NS IRRIG 1000ML POUR BTL (IV SOLUTION) ×2 IMPLANT
PACK VAGINAL WOMENS (CUSTOM PROCEDURE TRAY) ×2 IMPLANT
PLUG CATH AND CAP STER (CATHETERS) IMPLANT
SET CYSTO W/LG BORE CLAMP LF (SET/KITS/TRAYS/PACK) ×2 IMPLANT
SLING ADVANTAGE FIT (Sling) ×1 IMPLANT
SPONGE LAP 4X18 X RAY DECT (DISPOSABLE) ×2 IMPLANT
SUT CHROMIC 0 SH (SUTURE) ×4 IMPLANT
SUT CHROMIC 2 0 SH (SUTURE) ×6 IMPLANT
SUT CHROMIC 2 0 TIES 18 (SUTURE) IMPLANT
SUT MNCRL AB 3-0 PS2 27 (SUTURE) IMPLANT
SUT MNCRL AB 4-0 PS2 18 (SUTURE) IMPLANT
SUT SILK 2 0 FSL 18 (SUTURE) ×2 IMPLANT
SUT VIC AB 0 CT1 18XCR BRD8 (SUTURE) ×3 IMPLANT
SUT VIC AB 0 CT1 27 (SUTURE) ×2
SUT VIC AB 0 CT1 27XBRD ANBCTR (SUTURE) ×1 IMPLANT
SUT VIC AB 0 CT1 8-18 (SUTURE) ×6
SUT VIC AB 2-0 CT1 27 (SUTURE) ×4
SUT VIC AB 2-0 CT1 TAPERPNT 27 (SUTURE) ×2 IMPLANT
SUT VICRYL 0 CT 1 36IN (SUTURE) ×1 IMPLANT
SUT VICRYL 0 TIES 12 18 (SUTURE) ×2 IMPLANT
SYR TB 1ML 25GX5/8 (SYRINGE) ×2 IMPLANT
TOWEL OR 17X24 6PK STRL BLUE (TOWEL DISPOSABLE) ×4 IMPLANT
TRAY FOLEY CATH 14FR (SET/KITS/TRAYS/PACK) ×2 IMPLANT
WATER STERILE IRR 1000ML POUR (IV SOLUTION) ×2 IMPLANT

## 2012-01-26 NOTE — Op Note (Addendum)
OPERATIVE NOTE  Tammy Hernandez  DOB:    1956/11/12  MRN:    [id number]  CSN:    [id number]  Date of Surgery:  02/21.2013  Preoperative Diagnosis:  Pelvic relaxation  Stress urinary incontinence  Uterine prolapse  Postoperative Diagnosis:  Same  Procedure:  Vaginal hysterectomy  Anterior and posterior colporrhaphy  Placement of advantage fit urethral sling  Cystoscopy  Surgeon:  Leonard Schwartz, M.D.  Assistant:  Henreitta Leber, PA-C  Anesthetic:  Gen.  Disposition:  The patient is a 56 year old female who presents with a stage II prolapse of the bladder, a stage I prolapse of the uterus, and a stage I prolapse of the posterior vagina. She complains of stress urinary incontinence and pelvic pressure. She understands the indications for surgical procedure as well as her alternative treatment options. She accepts the risk of, but not limited to, anesthetic complications, bleeding, infections, possible damage to the surrounding organs, possible voiding dysfunction including urinary retention, and possible mesh erosion. The patient was referred to the [facility] website for information about the use of mesh in pelvic surgery.  Findings:  The uterus was small airway less than 100 g. The right fallopian tube and ovary appeared normal. The right fallopian tube was not mobile and therefore was not removed. The left fallopian tube appeared normal. The left tube was not mobile. It was not removed. I could not see the left ovary but I was able to palpate a very small ovary. The patient has a stage II prolapse of the bladder, a stage I prolapse of the uterus, and a stage I prolapse of the posterior vagina. No enterocele was present. On cystoscopy there was no evidence of damage to the bladder mucosa. No masses no pathology was appreciated. Blue dye quickly passed through both ureteral orifices.  Procedure:  The patient was taken to the operating room where a general anesthetic  was given. The patient's lower abdomen, perineum, and vagina were prepped with multiple layers of Betadine. A Foley catheter was placed in the bladder. The patient was sterilely draped. An examination under anesthesia was performed. Findings are mentioned above. The cervix was injected with half percent Marcaine with epinephrine. A circumferential incision was made around the cervix and the vaginal mucosa was advanced anteriorly and posteriorly. The anterior cul-de-sac and in the posterior cul-de-sac were sharply entered. Alternating from right to left the uterosacral ligaments, paracervical tissues, parametrial tissues, and uterine arteries were clamped, cut, sutured, and tied securely. The upper pedicles were clamped and cut. The uterus was removed from the operative field. The upper pedicles were secured using free ties and then suture ligatures. Hemostasis was adequate. The sutures attached to the uterosacral ligaments were brought out through the vaginal angles and tied securely. 2 McCall culdoplasty sutures were placed in the posterior cul-de-sac incorporating the uterosacral ligaments bilaterally and the posterior peritoneum. The vaginal mucosa that supports the bladder was then injected with half percent Marcaine with epinephrine. The vaginal mucosa was incised. We then sharply and bluntly dissected the vaginal mucosa from the underlying supporting tissue. The incision and dissection were continued to the level of the urethrovesical angle. The supporting fascia was then reapproximated in the midline in several layers first with 2-0 chromic catgut and then with 2-0 Vicryl. Care was taken not to damage the bladder mucosa. The excess vaginal mucosa was excised. The mid urethra was then injected with half percent Marcaine with epinephrine. A 1-1/2 cm incision was made. The vaginal mucosa was then separated  from the peri-urethral tissue sharply. This was accomplished on the right and the left. We then guided the  "advantage fit" needle with the mesh attached behind the symphysis pubis into the space of Retzius and then through the fascia. The needle was then guided through the suprapubic incision that had previously been injected with half percent Marcaine with epinephrine that was located to the right of the midline. An identical procedure was carried out on the opposite side. Hemostasis was adequate. The Foley catheter was removed from the bladder. The urethra was prepped with Betadine. The urethra was gently dilated. The 70 cystoscope was inserted into the bladder. The patient was given indigo carmine dye. The dye quickly passed through both ureteral orifices. The bladder mucosa was carefully inspected and there was no evidence of damage to the bladder. The cystoscope was removed. The urethra was prepped with Betadine. A Foley catheter was reinserted. We then positioned the mesh beneath the mid urethra with the appropriate tension. Care was taken not to tighten the mesh to tightly. The sheath was removed from the mesh. The mesh was trimmed at the level of the skin. The tension was again checked and was thought to be appropriate. The vaginal mucosa was then closed using figure-of-eight sutures of 2-0 Vicryl. The mucosa beneath the mesh was closed using 3-0 Monocryl. Hemostasis was felt to be adequate at this point. The apex of the vagina was then closed using figure-of-eight sutures incorporating the anterior vaginal mucosa, the anterior peritoneum, posterior peritoneum, and the posterior vaginal mucosa. Attention was then turned to the rectocele. The perineal body and the vaginal mucosa supporting the rectum were then injected with half percent Marcaine with epinephrine. An incision was made in the perineal body. The vaginal mucosa that supports the rectum was then sharply incised. We sharply and bluntly dissected the vaginal mucosa from the supporting tissue of the rectum. The fascia and supporting tissue of the  posterior vaginal space was then reapproximated in the midline in several layers using running and interrupted sutures of 0 Vicryl. The excess vaginal mucosa was sharply excised. The perineal body was reinforced. The vaginal mucosa and the perineal mucosa were then closed using figure-of-eight sutures of 0 Vicryl. The McCall culdoplasty sutures were tied securely and the apex of the vagina was noted to elevate into the midpelvis. The bladder, rectum, and the apex of the vagina were felt to be well supported. The vagina was then packed with quarter inch gauze that had been soaked with estrogen cream. The suprapubic incisions were closed using subcuticular sutures of 3-0 Monocryl. Sponge, needle, and instrument counts were correct on 2 occasions. The estimated blood loss for the procedure was 125 cc. The patient tolerated her procedure well. She was noted to drain blue urine that was clear. She was transported to the recovery room in stable condition. The uterus was sent to pathology. 0 Vicryl is the suture material used except where otherwise mentioned.   Leonard Schwartz, M.D.

## 2012-01-26 NOTE — Addendum Note (Signed)
Addendum  created 01/26/12 1316 by Doreene Burke, CRNA   Modules edited:Charges VN

## 2012-01-26 NOTE — Progress Notes (Signed)
Subjective: Patient reports tolerating PO.    Objective: I have reviewed patient's vital signs and intake and output.  General: alert, cooperative and no distress Resp: clear to auscultation bilaterally Cardio: regular rate and rhythm, S1, S2 normal, no murmur, click, rub or gallop GI: soft, non-tender; bowel sounds normal; no masses,  no organomegaly Extremities: extremities normal, atraumatic, no cyanosis or edema Vaginal Bleeding: minimal   Assessment/Plan: The patient is doing well. We will watch her blood pressures. We will remove her Foley catheter in the morning and begin her voiding trial. If she does well we will consider discharge tomorrow afternoon.   LOS: 0 days    Janine Limbo 01/26/2012, 6:56 PM

## 2012-01-26 NOTE — H&P (Signed)
The patient was interviewed and examined today.  The previously documented history and physical examination was reviewed. There are no changes. The operative procedure was reviewed. The risks and benefits were outlined again. The specific risks include, but are not limited to, anesthetic complications, bleeding, infections, and possible damage to the surrounding organs. The patient's questions were answered.  We are ready to proceed as outlined. The likelihood of the patient achieving the goals of this procedure is very likely.   Emalea Mix Vernon Berdia Lachman, M.D.  

## 2012-01-26 NOTE — Brief Op Note (Signed)
01/26/2012  12:08 PM  PATIENT:  Tammy Hernandez  56 y.o. female  PRE-OPERATIVE DIAGNOSIS:  Pelvic Relaxaton; Stress Urinary Incontenence  POST-OPERATIVE DIAGNOSIS:  Pelvic Relaxaton; Stress Urinary Incontenence  PROCEDURE:  Total Vaginal Hysterectomy, anterior and posterior colporrhaphy, placement of  Advantage Fit Vaginal Tape, cystoscopy and placement of vaginal packing.  SURGEON: Janine Limbo, MD         ANESTHESIA:   local and general  EBL:  125 cc   SPECIMEN:  Uterus/cervix  DISPOSITION OF SPECIMEN:  PATHOLOGY  COUNTS:  YES   DICTATION#  PATIENT DISPOSITION:  PACU - hemodynamically stable.   Danyla Wattley J. Lowell Guitar, PA-C

## 2012-01-26 NOTE — OR Nursing (Signed)
Assumed care of pt at 1152

## 2012-01-26 NOTE — Anesthesia Postprocedure Evaluation (Signed)
  Anesthesia Post-op Note  Patient: Tammy Hernandez  Procedure(s) Performed: Procedure(s) (LRB): ANTERIOR (CYSTOCELE) AND POSTERIOR REPAIR (RECTOCELE) (N/A) TRANSVAGINAL TAPE (TVT) PROCEDURE (N/A) HYSTERECTOMY VAGINAL (N/A)  Patient Location: PACU  Anesthesia Type: General  Level of Consciousness: awake, alert  and oriented  Airway and Oxygen Therapy: Patient Spontanous Breathing  Post-op Pain: mild  Post-op Assessment: Post-op Vital signs reviewed, Patient's Cardiovascular Status Stable, Respiratory Function Stable, Patent Airway, No signs of Nausea or vomiting and Pain level controlled  Post-op Vital Signs: Reviewed and stable  Complications: No apparent anesthesia complications

## 2012-01-26 NOTE — Transfer of Care (Signed)
Immediate Anesthesia Transfer of Care Note  Patient: Tammy Hernandez  Procedure(s) Performed: Procedure(s) (LRB): ANTERIOR (CYSTOCELE) AND POSTERIOR REPAIR (RECTOCELE) (N/A) TRANSVAGINAL TAPE (TVT) PROCEDURE (N/A) HYSTERECTOMY VAGINAL (N/A)  Patient Location: PACU  Anesthesia Type: General  Level of Consciousness: awake, alert  and oriented  Airway & Oxygen Therapy: Patient Spontanous Breathing and Patient connected to nasal cannula oxygen  Post-op Assessment: Report given to PACU RN and Post -op Vital signs reviewed and stable  Post vital signs: Reviewed and stable  Complications: No apparent anesthesia complications

## 2012-01-26 NOTE — Anesthesia Procedure Notes (Signed)
Procedure Name: Intubation Date/Time: 01/26/2012 9:18 AM Performed by: Karleen Dolphin Pre-anesthesia Checklist: Suction available, Emergency Drugs available, Timeout performed, Patient identified and Patient being monitored Patient Re-evaluated:Patient Re-evaluated prior to inductionOxygen Delivery Method: Circle system utilized Preoxygenation: Pre-oxygenation with 100% oxygen Intubation Type: IV induction Ventilation: Oral airway inserted - appropriate to patient size and Mask ventilation without difficulty Laryngoscope Size: 3 Grade View: Grade I Tube type: Oral Tube size: 7.0 mm Number of attempts: 1 Airway Equipment and Method: Stylet and Video-laryngoscopy Secured at: 21 cm Difficulty Due To: Difficulty was unanticipated, Difficult Airway- due to dentition and Difficult Airway- due to anterior larynx

## 2012-01-26 NOTE — Anesthesia Preprocedure Evaluation (Signed)
Anesthesia Evaluation  Patient identified by MRN, date of birth, ID band Patient awake    Reviewed: Allergy & Precautions, H&P , NPO status , Patient's Chart, lab work & pertinent test results  History of Anesthesia Complications (+) PONV  Airway Mallampati: III TM Distance: >3 FB Neck ROM: full    Dental  (+) Caps,    Pulmonary neg pulmonary ROS,  clear to auscultation  Pulmonary exam normal       Cardiovascular neg cardio ROS regular Normal    Neuro/Psych Negative Neurological ROS  Negative Psych ROS   GI/Hepatic Neg liver ROS, Irritable bowel syndrome   Endo/Other  Hypothyroidism  Renal/GU negative Renal ROS  Genitourinary negative   Musculoskeletal   Abdominal Normal abdominal exam  (+) obese,   Peds  Hematology negative hematology ROS (+)   Anesthesia Other Findings   Reproductive/Obstetrics negative OB ROS                           Anesthesia Physical Anesthesia Plan  ASA: II  Anesthesia Plan: General and General ETT   Post-op Pain Management:    Induction:   Airway Management Planned:   Additional Equipment:   Intra-op Plan:   Post-operative Plan:   Informed Consent: I have reviewed the patients History and Physical, chart, labs and discussed the procedure including the risks, benefits and alternatives for the proposed anesthesia with the patient or authorized representative who has indicated his/her understanding and acceptance.   Dental Advisory Given  Plan Discussed with: Anesthesiologist, Surgeon and CRNA  Anesthesia Plan Comments:         Anesthesia Quick Evaluation

## 2012-01-27 ENCOUNTER — Encounter (HOSPITAL_COMMUNITY): Payer: Self-pay | Admitting: Obstetrics and Gynecology

## 2012-01-27 LAB — CBC
HCT: 36.8 % (ref 36.0–46.0)
MCHC: 32.3 g/dL (ref 30.0–36.0)
MCV: 94.1 fL (ref 78.0–100.0)
Platelets: 225 10*3/uL (ref 150–400)
RDW: 13.2 % (ref 11.5–15.5)

## 2012-01-27 NOTE — Progress Notes (Signed)
Subjective: Patient reports tolerating PO.    Objective:  CBC    Component Value Date/Time   WBC 15.6* 01/27/2012 0534   RBC 3.91 01/27/2012 0534   HGB 11.9* 01/27/2012 0534   HCT 36.8 01/27/2012 0534   PLT 225 01/27/2012 0534   MCV 94.1 01/27/2012 0534   MCH 30.4 01/27/2012 0534   MCHC 32.3 01/27/2012 0534   RDW 13.2 01/27/2012 0534     I have reviewed patient's vital signs, intake and output and labs.  General: alert, cooperative and no distress Resp: clear to auscultation bilaterally Cardio: regular rate and rhythm, S1, S2 normal, no murmur, click, rub or gallop GI: soft, non-tender; bowel sounds normal; no masses,  no organomegaly Extremities: extremities normal, atraumatic, no cyanosis or edema Vaginal Bleeding: minimal  Voice/residuals:  150/468, 75/461, 200/443, 250/356. Foley catheter placed in the bladder.   Assessment/Plan:   The patient reports that she feels well and she does not feel pressure in her bladder. Her nausea has resolved. She was given the option to be discharged to home with the catheter in place. She declines that option and would rather continue her bladder training here in the hospital. We will keep her Foley catheter in overnight and try her boarding trowel again in the morning.  LOS: 1 day    Joey Lierman V 01/27/2012, 3:22 PM

## 2012-01-27 NOTE — Progress Notes (Signed)
UR Chart review completed.  

## 2012-01-27 NOTE — Anesthesia Postprocedure Evaluation (Signed)
  Anesthesia Post-op Note  Patient: Tammy Hernandez  Procedure(s) Performed: Procedure(s) (LRB): ANTERIOR (CYSTOCELE) AND POSTERIOR REPAIR (RECTOCELE) (N/A) TRANSVAGINAL TAPE (TVT) PROCEDURE (N/A) HYSTERECTOMY VAGINAL (N/A)  Patient Location: 304  Anesthesia Type: General  Level of Consciousness: awake, alert  and oriented  Airway and Oxygen Therapy: Patient Spontanous Breathing  Post-op Pain: none  Post-op Assessment: Post-op Vital signs reviewed  Post-op Vital Signs: Reviewed and stable  Complications: No apparent anesthesia complications

## 2012-01-27 NOTE — Progress Notes (Signed)
Vaginal packing removed.  Gauze packing moderately soaked with dark-red blood. OOB to bathroom with assist, scant vaginal bleeding noted.  Ambulation tolerated well.

## 2012-01-27 NOTE — Addendum Note (Signed)
Addendum  created 01/27/12 1326 by Isabella Bowens, CRNA   Modules edited:Notes Section

## 2012-01-28 MED ORDER — ESTRADIOL 0.1 MG/GM VA CREA: 0.5000 | TOPICAL_CREAM | VAGINAL | Status: AC

## 2012-01-28 MED ORDER — HYDROCODONE-ACETAMINOPHEN 5-325 MG PO TABS: 1.0000 | ORAL_TABLET | ORAL | Status: AC | PRN

## 2012-01-28 MED ORDER — PROMETHAZINE HCL 12.5 MG PO TABS: 12.5000 mg | ORAL_TABLET | Freq: Four times a day (QID) | ORAL | Status: AC | PRN

## 2012-01-28 MED ORDER — ESTRADIOL 0.1 MG/GM VA CREA: 2.0000 g | TOPICAL_CREAM | VAGINAL | Status: AC

## 2012-01-28 MED ORDER — DICYCLOMINE HCL 10 MG PO CAPS: 10.0000 mg | ORAL_CAPSULE | Freq: Three times a day (TID) | ORAL | Status: DC

## 2012-01-28 MED ORDER — DIPHENOXYLATE-ATROPINE 2.5-0.025 MG PO TABS: 1.0000 | ORAL_TABLET | Freq: Two times a day (BID) | ORAL | Status: AC

## 2012-01-28 MED ORDER — HYDROCODONE-ACETAMINOPHEN 5-500 MG PO TABS
1.0000 | ORAL_TABLET | ORAL | Status: AC | PRN
Start: 2012-01-28 — End: 2012-02-07

## 2012-01-28 MED ORDER — IBUPROFEN 600 MG PO TABS: 600.0000 mg | ORAL_TABLET | Freq: Four times a day (QID) | ORAL | Status: AC

## 2012-01-28 MED ORDER — IBUPROFEN 200 MG PO TABS: 600.0000 mg | ORAL_TABLET | Freq: Four times a day (QID) | ORAL | Status: AC | PRN

## 2012-01-28 NOTE — Discharge Instructions (Signed)
Clean Intermittent Catheterization, Female Clean intermittent catheterization (CIC) refers to emptying urine from the bladder with a small, flexible tube (catheter). The bladder is an organ in the body that stores urine. Urine may need to be drained from your bladder with a catheter if:  There is an obstruction to the flow of urine out of the bladder or through the urinary tract.   The bladder muscles or nerves are not functioning properly to allow normal flow of urine out of the bladder.  Emptying the bladder regularly will help prevent further permanent bladder or kidney damage. SUPPLIES FOR CIC You will need:  The specific type and size of catheter as directed by your caregiver.   Water-soluble, lubricating jelly (if the catheter is not pre-lubricated). Do not use an oil-based lubricant.   A warm, soapy washcloth or pre-moistened wipes.   A container to collect the urine (if you are not using the toilet).   A container or bag to store the catheter.  HOW TO PERFORM CIC Follow these steps for a clean technique: 1. Collect supplies and place them within your reach.  2. Wash your hands thoroughly with soap and water.  3. Get in a comfortable position. Positions include:  Sitting forward-facing or backward-facing on a toilet, wheelchair, chair, or edge of bed. It may be helpful to sit forward-facing with a mirror on a stool positioned to help you view the opening of the urethra. Or sit backward-facing on a toilet with a mirror positioned between the toilet lid and toilet seat to help you view the opening of the urethra.   Standing beside the toilet with one foot on the toilet rim.   Lying down with your head raised on pillows.  4. Position the collection container between your legs (if you are not using the toilet).  5. Urinate (if you are able).  6. Place water-soluble, lubricating jelly on about 2 inches (5 cm) of the tip of the catheter (if the catheter is not pre-lubricated).   7. Set the catheter down on a clean, dry surface within reach.  8. Spread the labia folds open.  9. Wash the labia with the warm, soapy washcloth or pre-moistened wipes. Wash from front to back.  10. Hold the labia open with one hand.  11. Relax.  12. Insert the catheter gently into the urethra opening in an upward and backward direction until urine starts to flow, usually 2 to 3 inches (5 to 8 cm).  13. When urine starts to flow, insert the catheter about 1 inch (3 cm) more.  14. When the urine stops flowing, strain or gently push on the lower abdominal muscles to help the bladder drain fully.  15. Gently pull the catheter out.  16. Wash the labia and genital area.  17. Report any changes in the urine to your caregiver.  18. Discard the urine.  19. If you are using a multiple use catheter, wash the catheter as directed by your caregiver. Rinse. Allow to air dry. Store the catheter in a clean, dry container or bag.  20. Wash your hands.  HOME CARE INSTRUCTIONS  Drink 6 to 8 glasses of fluid each day. Avoid caffeine. Caffeine may make you have to urinate more frequently and more urgently.   Empty your bladder every 4 to 6 hours or as directed by your caregiver.   Perform a CIC if you have symptoms of too much urine in your bladder (overdistension), and you are not able to urinate. Symptoms of  overdistension include:   Restlessness.   Sweating or chills.   Headache.   Flushed or pale color.   Cold limbs.   Bloated lower abdomen.   Discard a multiple use catheter when it becomes dry, brittle, or cloudy (usually after 1 week of use).   Take all medications as directed by your caregiver.  SEEK MEDICAL CARE IF:  You are having trouble performing any of the steps.   You are leaking urine.   You have pain when you urinate.   You notice blood in your urine.   You feel the need to empty your bladder (void) often.   Your urine is cloudy or smells different.   You have pain  in your abdomen.   You develop a rash or sores.  SEEK IMMEDIATE MEDICAL CARE IF:  You have a fever or persistent symptoms for more than 72 hours.   You have a fever and your symptoms suddenly get worse.   Your pain becomes severe.  Document Released: 12/24/2010 Document Revised: 08/03/2011 Document Reviewed: 12/24/2010 [facility] Patient Information 2012 [place], [place].Anterior or Posterior Colporrhaphy, Sling Procedure Care After Refer to this sheet in the next few weeks. These instructions provide you with information on caring for yourself after your procedure. Your caregiver may also give you specific instructions. Your treatment has been planned according to current medical practices, but problems sometimes occur. Call your caregiver if you have any problems or questions after your procedure. HOME CARE INSTRUCTIONS  Rest as much as possible the first 2 weeks.   Avoid heavy lifting (more than 10 pounds or 4.5 kg), pushing, or pulling. Limit stair climbing to once or twice a day the first week, then slowly increase this activity.   Avoid standing for prolonged periods of time.   Talk with your caregiver about when you may resume your usual physical activity.   You may resume your normal diet.   Limit fluids to 6 to 8 glasses of non-caffeinated beverages per day.   Eat a well-balanced diet. Daily portions of food from the meat (protein), milk, fruit, vegetable, and bread families are necessary for your health.   Your normal bowel function should return. If constipation should occur, you may:   Take a mild laxative.   Add fruit and bran to your diet.   Drink more liquids.   If additional bladder care is required, you will receive instructions from your caregiver. Call your caregiver if you experience burning, urgency, or frequency on urination or if you are unable to completely empty your bladder.   You may take a shower and wash your hair.   Only take over-the-counter or  prescription medicines for pain, fever, or discomfort as directed by your caregiver.   Clean the incision with water. Do not use a dressing unless the incision is draining or irritated. Check your incision daily for redness, draining, swelling, or separation of the skin. Call your caregiver if you notice any of these.   Keep your perineal area (the area between vagina and rectum) clean and dry. Perform perineal care after every bowel movement and each time you urinate. You may take a sitz bath or sit in a tub of clean, warm water when necessary, unless your caregiver tells you otherwise.   Do not have sexual intercourse until permitted by your caregiver.   It is still important to have a yearly pelvic exam.  SEEK MEDICAL CARE IF:  You have shaking chills.   You have an oral temperature above  102 F (38.9 C).   Your pain is not relieved or becomes severe.  MAKE SURE YOU:   Understand these instructions.   Will watch your condition.   Will get help right away if you are not doing well or get worse.  Document Released: 07/05/2004 Document Revised: 08/03/2011 Document Reviewed: 03/26/2008 [facility] Patient Information 2012 [place], [place].  Anterior or Posterior Colporrhaphy, Sling Procedure Care After Refer to this sheet in the next few weeks. These instructions provide you with information on caring for yourself after your procedure. Your caregiver may also give you specific instructions. Your treatment has been planned according to current medical practices, but problems sometimes occur. Call your caregiver if you have any problems or questions after your procedure. HOME CARE INSTRUCTIONS  Rest as much as possible the first 2 weeks.   Avoid heavy lifting (more than 10 pounds or 4.5 kg), pushing, or pulling. Limit stair climbing to once or twice a day the first week, then slowly increase this activity.   Avoid standing for prolonged periods of time.   Talk with your caregiver about  when you may resume your usual physical activity.   You may resume your normal diet.   Limit fluids to 6 to 8 glasses of non-caffeinated beverages per day.   Eat a well-balanced diet. Daily portions of food from the meat (protein), milk, fruit, vegetable, and bread families are necessary for your health.   Your normal bowel function should return. If constipation should occur, you may:   Take a mild laxative.   Add fruit and bran to your diet.   Drink more liquids.   If additional bladder care is required, you will receive instructions from your caregiver. Call your caregiver if you experience burning, urgency, or frequency on urination or if you are unable to completely empty your bladder.   You may take a shower and wash your hair.   Only take over-the-counter or prescription medicines for pain, fever, or discomfort as directed by your caregiver.   Clean the incision with water. Do not use a dressing unless the incision is draining or irritated. Check your incision daily for redness, draining, swelling, or separation of the skin. Call your caregiver if you notice any of these.   Keep your perineal area (the area between vagina and rectum) clean and dry. Perform perineal care after every bowel movement and each time you urinate. You may take a sitz bath or sit in a tub of clean, warm water when necessary, unless your caregiver tells you otherwise.   Do not have sexual intercourse until permitted by your caregiver.   It is still important to have a yearly pelvic exam.  SEEK MEDICAL CARE IF:  You have shaking chills.   You have an oral temperature above 102 F (38.9 C).   Your pain is not relieved or becomes severe.  MAKE SURE YOU:   Understand these instructions.   Will watch your condition.   Will get help right away if you are not doing well or get worse.  Document Released: 07/05/2004 Document Revised: 08/03/2011 Document Reviewed: 03/26/2008 [facility] Patient  Information 2012 [place], [place].

## 2012-01-28 NOTE — Discharge Summary (Signed)
Discharge summary  Tammy Hernandez  Date of birth: 10/20/1956  Medical records number: [id number]  CSN: [id number]  Date of admission: 01/26/2012  Date of discharge: 01/28/2012  Admission diagnosis:  Stress urinary incontinence  Pelvic relaxation with a cystocele and rectocele  Uterine prolapse  Pelvic pressure symptoms and pelvic pain  Discharge diagnosis:  The same  Anemia  Procedures this admission: 01/26/2012  Vaginal hysterectomy  Anterior and posterior colporrhaphy  Advantage fit tension-free vaginal tape  Cystoscopy  History of present illness:  The patient is a 56 year old female who presents with the above diagnosis. On exam she was noted to have a large cystocele, moderate rectocele, and uterine descent. Please see her dictated history and physical exam for details.  Hospital course:  On the day of admission the patient underwent the above-mentioned procedure. Operative findings included a small uterus, a stage II cystocele, stage I rectocele, and stage I prolapse of the uterus. On cystoscopy there was no evidence of damage to the bladder. The patient's postoperative course was complicated by poor function of the bladder on postoperative day #1. However on postoperative day #2 the patient was able to void greater volumes of urine and was left as a residual. She quickly tolerated a regular diet. She was discharged home postoperative day #2.  CBC    Component Value Date/Time   WBC 15.6* 01/27/2012 0534   RBC 3.91 01/27/2012 0534   HGB 11.9* 01/27/2012 0534   HCT 36.8 01/27/2012 0534   PLT 225 01/27/2012 0534   MCV 94.1 01/27/2012 0534   MCH 30.4 01/27/2012 0534   MCHC 32.3 01/27/2012 0534   RDW 13.2 01/27/2012 0534    Discharge medications:  Vicodin, Motrin, Phenergan, vaginal estrogen.  Discharge instructions:  The patient will return to see Dr. Stefano Gaul in 6 weeks. She will void every 2 hours. She was taught to do self-catheterization ever bladder  becomes overdistended or she cannot void. She was told to call for questions or concerns.  Final pathology report:  Pathology report was pending at the time of discharge.  Mylinda Latina.D.

## 2012-01-28 NOTE — Progress Notes (Signed)
D/c instructions reviewed with pt. And husband.  Pt. Watched video r.e self cath. Caths. Given to pt. To take home.  No other home equipment needed.  Ambulated to car with staff without incident.  D/c'd home with husband

## 2012-01-28 NOTE — Progress Notes (Signed)
Subjective: Patient reports tolerating PO, + flatus and no problems voiding.    Objective: I have reviewed patient's vital signs, intake and output, medications and labs.  General: alert, cooperative and no distress Resp: clear to auscultation bilaterally Cardio: regular rate and rhythm, S1, S2 normal, no murmur, click, rub or gallop GI: soft, non-tender; bowel sounds normal; no masses,  no organomegaly Extremities: extremities normal, atraumatic, no cyanosis or edema  Voids greater than residuals.   Assessment/Plan: Doing well. Wants to go home.  Instructed on voiding and self cath.  Call for problems.  LOS: 2 days    Oval Moralez V 01/28/2012, 12:01 PM

## 2012-03-01 ENCOUNTER — Encounter (INDEPENDENT_AMBULATORY_CARE_PROVIDER_SITE_OTHER): Payer: BC Managed Care – PPO | Admitting: Obstetrics and Gynecology

## 2012-03-01 DIAGNOSIS — N8189 Other female genital prolapse: Secondary | ICD-10-CM

## 2012-03-01 DIAGNOSIS — N390 Urinary tract infection, site not specified: Secondary | ICD-10-CM

## 2012-07-09 ENCOUNTER — Ambulatory Visit (INDEPENDENT_AMBULATORY_CARE_PROVIDER_SITE_OTHER): Payer: BC Managed Care – PPO | Admitting: Obstetrics and Gynecology

## 2012-07-09 ENCOUNTER — Encounter: Payer: Self-pay | Admitting: Obstetrics and Gynecology

## 2012-07-09 VITALS — BP 128/72 | Temp 97.8°F | Resp 16 | Ht 63.0 in | Wt 176.0 lb

## 2012-07-09 DIAGNOSIS — R109 Unspecified abdominal pain: Secondary | ICD-10-CM

## 2012-07-09 DIAGNOSIS — R351 Nocturia: Secondary | ICD-10-CM | POA: Insufficient documentation

## 2012-07-09 LAB — POCT URINALYSIS DIPSTICK
Blood, UA: NEGATIVE
Glucose, UA: NEGATIVE
Nitrite, UA: NEGATIVE
Spec Grav, UA: <=1.005
Urobilinogen, UA: NEGATIVE
pH, UA: 8

## 2012-07-09 MED ORDER — OXYBUTYNIN CHLORIDE ER 5 MG PO TB24: 5.0000 mg | ORAL_TABLET | Freq: Every day | ORAL | Status: DC

## 2012-07-09 NOTE — Progress Notes (Signed)
Vag. Discharge:no Odor:yes Fever:no Irreg.Periods:no Dyspareunia:no Dysuria:no "uncomfortable"  Frequency:yes Urgency:yes Hematuria:no Kidney stones:no Constipation:no Diarrhea:no Rectal Bleeding: no Vomiting:no Nausea:no Pregnant:no Fibroids:yes Endometriosis:no Hx of Ovarian Cyst:no Hx IUD:no Hx STD-PID:no Appendectomy:no Gall Bladder Dz:no  HISTORY OF PRESENT ILLNESS  [name] is a 56 y.o. year old female,G1P1, who presents for a problem visit. The patient had a vaginal hysterectomy with an anterior and posterior colporrhaphy and a TVT. The patient was treated with antibiotics by a family physician.  This did not help.  Subjective:  The patient reports that she is continent but now gets up 4 times each night to void.  This is interrupting her sleep.  Objective:  BP 128/72  Temp 97.8 F (36.6 C) (Oral)  Resp 16  Ht 5\' 3"  (1.6 m)  Wt 176 lb (79.833 kg)  BMI 31.18 kg/m2   Resp: clear to auscultation bilaterally Cardio: regular rate and rhythm, S1, S2 normal, no murmur, click, rub or gallop GI: nontender, no masses  External genitalia: normal general appearance Vaginal: a granulation polyp was noted.  The polyp was removed.  Silver nitrate was applied. Cervix: absent Adnexa: normal bimanual exam Uterus: absent Good support to the urethrovesical angle.  UA: Negative  Assessment:  Nocturia.  Plan:  Oxybutynin ER 5 mg before bedtime. Medication discussed.  Call for problems.  Return to office in 2 week(s).   Leonard Schwartz M.D.  07/09/2012 11:48 AM

## 2012-07-23 ENCOUNTER — Ambulatory Visit (INDEPENDENT_AMBULATORY_CARE_PROVIDER_SITE_OTHER): Payer: BC Managed Care – PPO | Admitting: Obstetrics and Gynecology

## 2012-07-23 ENCOUNTER — Encounter: Payer: Self-pay | Admitting: Obstetrics and Gynecology

## 2012-07-23 VITALS — BP 120/72 | Temp 97.9°F | Wt 180.0 lb

## 2012-07-23 DIAGNOSIS — R0602 Shortness of breath: Secondary | ICD-10-CM

## 2012-07-23 DIAGNOSIS — R351 Nocturia: Secondary | ICD-10-CM

## 2012-07-23 NOTE — Progress Notes (Signed)
HISTORY OF PRESENT ILLNESS  [name] is a 56 y.o. year old female,G1P1, who presents for a problem visit. She is status post hysterectomy, A&P repair, and TVT. She complained of nocturia.  A granulation polyp was removed from the apex of the vagina.  Subjective:  She is doing better.  She only goes to the bathroom once a night. She complains of shortness of breath.  She once had pneumonia with the same symptoms.  Objective:  BP 120/72  Temp 97.9 F (36.6 C) (Oral)  Wt 180 lb (81.647 kg)   General: alert and no distress Resp: clear to auscultation bilaterally Cardio: regular rate and rhythm, S1, S2 normal, no murmur, click, rub or gallop GI: soft and nontender  External genitalia: normal general appearance Vaginal: normal without tenderness, induration or masses and atrophic mucosa Cervix: absent Adnexa: normal bimanual exam Uterus: absent  Assessment:  Improved nocturia Shortness of breath  Plan:  Okay to return to normal activities and sexual function. Patient to see her family physician about shortness of breath and a possible chest x-ray.  CBC offered but declined.  Return to office in 2 month(s).   Leonard Schwartz M.D.  07/23/2012 11:52 AM

## 2012-08-16 ENCOUNTER — Other Ambulatory Visit: Payer: Self-pay | Admitting: Family Medicine

## 2012-08-16 DIAGNOSIS — Z1231 Encounter for screening mammogram for malignant neoplasm of breast: Secondary | ICD-10-CM

## 2012-09-05 ENCOUNTER — Encounter: Payer: Self-pay | Admitting: Obstetrics and Gynecology

## 2012-09-05 ENCOUNTER — Ambulatory Visit (INDEPENDENT_AMBULATORY_CARE_PROVIDER_SITE_OTHER): Payer: BC Managed Care – PPO | Admitting: Obstetrics and Gynecology

## 2012-09-05 VITALS — BP 110/78 | HR 84 | Resp 18 | Ht 63.0 in | Wt 180.0 lb

## 2012-09-05 DIAGNOSIS — Z124 Encounter for screening for malignant neoplasm of cervix: Secondary | ICD-10-CM

## 2012-09-05 DIAGNOSIS — Z01419 Encounter for gynecological examination (general) (routine) without abnormal findings: Secondary | ICD-10-CM

## 2012-09-05 NOTE — Addendum Note (Signed)
Addended by: Tim Lair on: 09/05/2012 10:06 AM   Modules accepted: Orders

## 2012-09-05 NOTE — Progress Notes (Signed)
Subjective:    Tammy Hernandez is a 56 y.o. female G1P1 who presents for annual exam. The patient has no complaints today. Her urinary incontinence and nocturia are much better since her vaginal hysterectomy, A&P repair, and TVT in February 2013.  She has a history of osteopenia.  Her last bone density scan within 2010. She is not sexually active due to female factor.  The following portions of the patient's history were reviewed and updated as appropriate: allergies, current medications, past family history, past medical history, past social history, past surgical history and problem list.  Review of Systems Pertinent items are noted in HPI. Gastrointestinal:No change in bowel habits, no abdominal pain, no rectal bleeding Genitourinary:negative for dysuria, frequency, hematuria, nocturia and urinary incontinence    Objective:     BP 110/78  Pulse 84  Resp 18  Ht 5\' 3"  (1.6 m)  Wt 180 lb (81.647 kg)  BMI 31.89 kg/m2  Weight:  Wt Readings from Last 1 Encounters:  09/05/12 180 lb (81.647 kg)     BMI: Body mass index is 31.89 kg/(m^2). General Appearance: Alert, appropriate appearance for age. No acute distress HEENT: Grossly normal Neck / Thyroid: Supple, no masses, nodes or enlargement Lungs: clear to auscultation bilaterally Back: No CVA tenderness Breast Exam: No masses or nodes.No dimpling, nipple retraction or discharge. Cardiovascular: Regular rate and rhythm. S1, S2, no murmur Gastrointestinal: Soft, non-tender, no masses or organomegaly  ++++++++++++++++++++++++++++++++++++++++++++++++++++++++  Pelvic Exam: External genitalia: normal general appearance Vaginal: atrophic mucosa Cervix: absent Adnexa: normal bimanual exam Uterus: absent Rectovaginal: normal rectal, no masses  ++++++++++++++++++++++++++++++++++++++++++++++++++++++++  Lymphatic Exam: Non-palpable nodes in neck, clavicular, axillary, or inguinal regions  Psychiatric: Alert and oriented, appropriate  affect.@OBJECTIVEEN @      Assessment:    Normal gyn exam Menopause improved urinary incontinence and nocturia   Osteopenia  Overweight or obese: Yes  Pelvic relaxation: Yes  Menopausal symptoms: Yes. Severe: No.   Plan:    bone density scan   Follow-up:  for annual exam  The patient requested that I obtain a Pap smear: No.  Kegel exercises discussed: Yes.  Proper diet and regular exercise were reviewed.  Annual mammograms recommended starting at age 42. Proper breast care was discussed.  Screening colonoscopy is recommended beginning at age 18.  Regular health maintenance was reviewed.  Sleep hygiene was discussed.  Adequate calcium and vitamin D intake was emphasized.  Leonard Schwartz M.D.   Regular Periods: no Mammogram: yes 2012 "WNL"  Monthly Breast Ex.: yes Exercise: no  Tetanus < 10 years: no Seatbelts: yes  NI. Bladder Functn.: yes "Better w/ Rx" Abuse at home: no  Daily BM's: yes Stressful Work: yes  Healthy Diet: no Sigmoid-Colonoscopy: "2007" "WNL"  Calcium: yes Medical problems this year: None pt states she has no concerns today.    LAST PAP:09/2009 "WNL"  Contraception: NONE  M[facility]:  [facility]  PCP: Dr.James Little  PMH: No Changes  FMH: No Changes  Last Bone Scan: 11-30-2009 T-score -1.6  L hip, T-score -2.5 Femoral Neck (Left) T-score  -1.0 AP Spine .

## 2012-09-12 ENCOUNTER — Other Ambulatory Visit: Payer: BC Managed Care – PPO

## 2012-09-12 ENCOUNTER — Ambulatory Visit
Admission: RE | Admit: 2012-09-12 | Discharge: 2012-09-12 | Disposition: A | Discharge status: Home / Self Care | Payer: BC Managed Care – PPO | Source: Ambulatory Visit | Attending: Family Medicine | Admitting: Family Medicine

## 2012-09-12 DIAGNOSIS — Z1231 Encounter for screening mammogram for malignant neoplasm of breast: Secondary | ICD-10-CM

## 2012-11-08 ENCOUNTER — Other Ambulatory Visit (INDEPENDENT_AMBULATORY_CARE_PROVIDER_SITE_OTHER): Payer: BC Managed Care – PPO

## 2012-11-08 DIAGNOSIS — M899 Disorder of bone, unspecified: Secondary | ICD-10-CM

## 2012-11-08 DIAGNOSIS — M949 Disorder of cartilage, unspecified: Secondary | ICD-10-CM

## 2012-11-15 ENCOUNTER — Telehealth: Payer: Self-pay | Admitting: Obstetrics and Gynecology

## 2012-11-15 NOTE — Telephone Encounter (Signed)
Patient called on cellular telephone.  No answer.  Message left.  The patient was told that her bone density scan showed osteopenia or low bone mass.  We recommended weight-bearing exercise, proper diet, and proper calcium and vitamin D replacement.  Dr. Stefano Gaul

## 2013-01-06 ENCOUNTER — Other Ambulatory Visit: Payer: Self-pay | Admitting: Obstetrics and Gynecology

## 2013-01-07 NOTE — Telephone Encounter (Signed)
Can pt have refill on rx

## 2013-01-13 ENCOUNTER — Other Ambulatory Visit: Payer: Self-pay | Admitting: Obstetrics and Gynecology

## 2013-01-15 ENCOUNTER — Telehealth: Payer: Self-pay

## 2013-01-15 NOTE — Telephone Encounter (Signed)
Message sent to [facility] regarding pt RX   LC CMA

## 2013-01-16 ENCOUNTER — Other Ambulatory Visit: Payer: Self-pay | Admitting: Obstetrics and Gynecology

## 2013-01-16 DIAGNOSIS — R3915 Urgency of urination: Secondary | ICD-10-CM

## 2013-01-16 DIAGNOSIS — R351 Nocturia: Secondary | ICD-10-CM

## 2013-01-16 MED ORDER — OXYBUTYNIN CHLORIDE ER 5 MG PO TB24: 5.0000 mg | ORAL_TABLET | Freq: Every day | ORAL | Status: DC

## 2013-08-09 ENCOUNTER — Other Ambulatory Visit: Payer: Self-pay

## 2013-08-09 DIAGNOSIS — Z1231 Encounter for screening mammogram for malignant neoplasm of breast: Secondary | ICD-10-CM

## 2013-09-13 ENCOUNTER — Ambulatory Visit
Admission: RE | Admit: 2013-09-13 | Discharge: 2013-09-13 | Disposition: A | Discharge status: Home / Self Care | Payer: BC Managed Care – PPO | Source: Ambulatory Visit

## 2013-09-13 DIAGNOSIS — Z1231 Encounter for screening mammogram for malignant neoplasm of breast: Secondary | ICD-10-CM

## 2014-08-25 ENCOUNTER — Other Ambulatory Visit: Payer: Self-pay

## 2014-08-25 DIAGNOSIS — Z1231 Encounter for screening mammogram for malignant neoplasm of breast: Secondary | ICD-10-CM

## 2014-09-16 ENCOUNTER — Ambulatory Visit
Admission: RE | Admit: 2014-09-16 | Discharge: 2014-09-16 | Disposition: A | Discharge status: Home / Self Care | Payer: BC Managed Care – PPO | Source: Ambulatory Visit

## 2014-09-16 DIAGNOSIS — Z1231 Encounter for screening mammogram for malignant neoplasm of breast: Secondary | ICD-10-CM

## 2014-10-06 ENCOUNTER — Encounter: Payer: Self-pay | Admitting: Obstetrics and Gynecology

## 2015-09-03 ENCOUNTER — Other Ambulatory Visit: Payer: Self-pay

## 2015-09-03 DIAGNOSIS — Z1231 Encounter for screening mammogram for malignant neoplasm of breast: Secondary | ICD-10-CM

## 2015-09-18 ENCOUNTER — Ambulatory Visit
Admission: RE | Admit: 2015-09-18 | Discharge: 2015-09-18 | Disposition: A | Discharge status: Home / Self Care | Payer: BLUE CROSS/BLUE SHIELD | Source: Ambulatory Visit

## 2015-09-18 DIAGNOSIS — Z1231 Encounter for screening mammogram for malignant neoplasm of breast: Secondary | ICD-10-CM

## 2016-08-30 ENCOUNTER — Other Ambulatory Visit: Payer: Self-pay | Admitting: Family Medicine

## 2016-08-30 DIAGNOSIS — Z1231 Encounter for screening mammogram for malignant neoplasm of breast: Secondary | ICD-10-CM

## 2016-09-23 ENCOUNTER — Ambulatory Visit
Admission: RE | Admit: 2016-09-23 | Discharge: 2016-09-23 | Disposition: A | Discharge status: Home / Self Care | Payer: BLUE CROSS/BLUE SHIELD | Source: Ambulatory Visit | Attending: Family Medicine | Admitting: Family Medicine

## 2016-09-23 DIAGNOSIS — Z1231 Encounter for screening mammogram for malignant neoplasm of breast: Secondary | ICD-10-CM

## 2017-08-15 ENCOUNTER — Other Ambulatory Visit: Payer: Self-pay | Admitting: Family Medicine

## 2017-08-15 DIAGNOSIS — Z1231 Encounter for screening mammogram for malignant neoplasm of breast: Secondary | ICD-10-CM

## 2017-09-25 ENCOUNTER — Ambulatory Visit
Admission: RE | Admit: 2017-09-25 | Discharge: 2017-09-25 | Disposition: A | Discharge status: Home / Self Care | Payer: 59 | Source: Ambulatory Visit | Attending: Family Medicine | Admitting: Family Medicine

## 2017-09-25 DIAGNOSIS — Z1231 Encounter for screening mammogram for malignant neoplasm of breast: Secondary | ICD-10-CM

## 2017-09-25 IMAGING — MG DIGITAL SCREENING BILATERAL MAMMOGRAM WITH CAD
4 series · 4 of 4 positions shown · non-contrast
Comparison: Previous exam(s).

CLINICAL DATA: Screening.

EXAM:
DIGITAL SCREENING BILATERAL MAMMOGRAM WITH CAD

[R MLO]
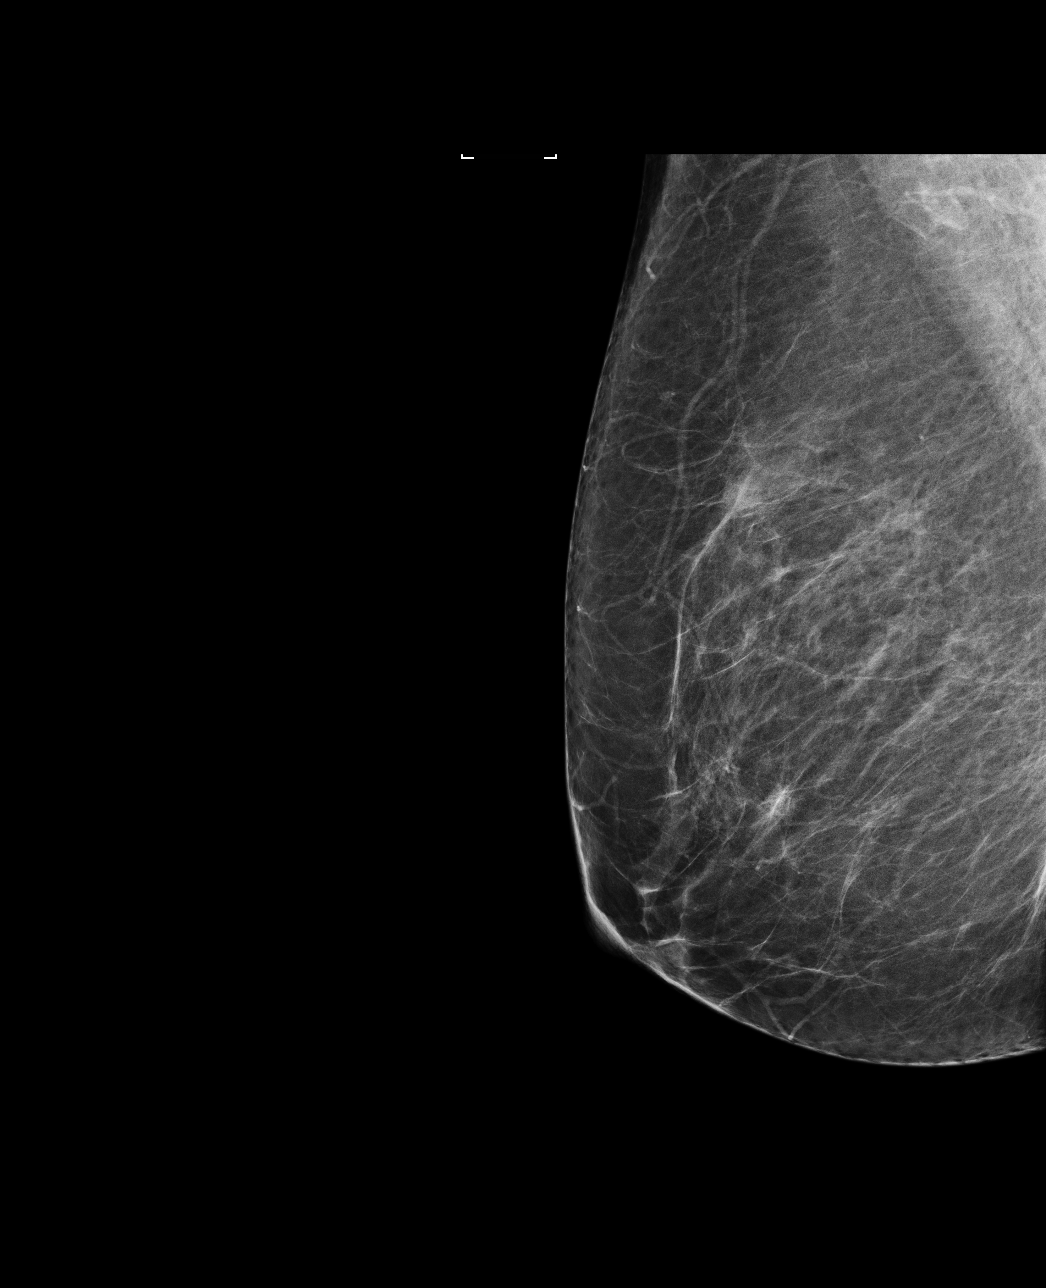

[R CC]
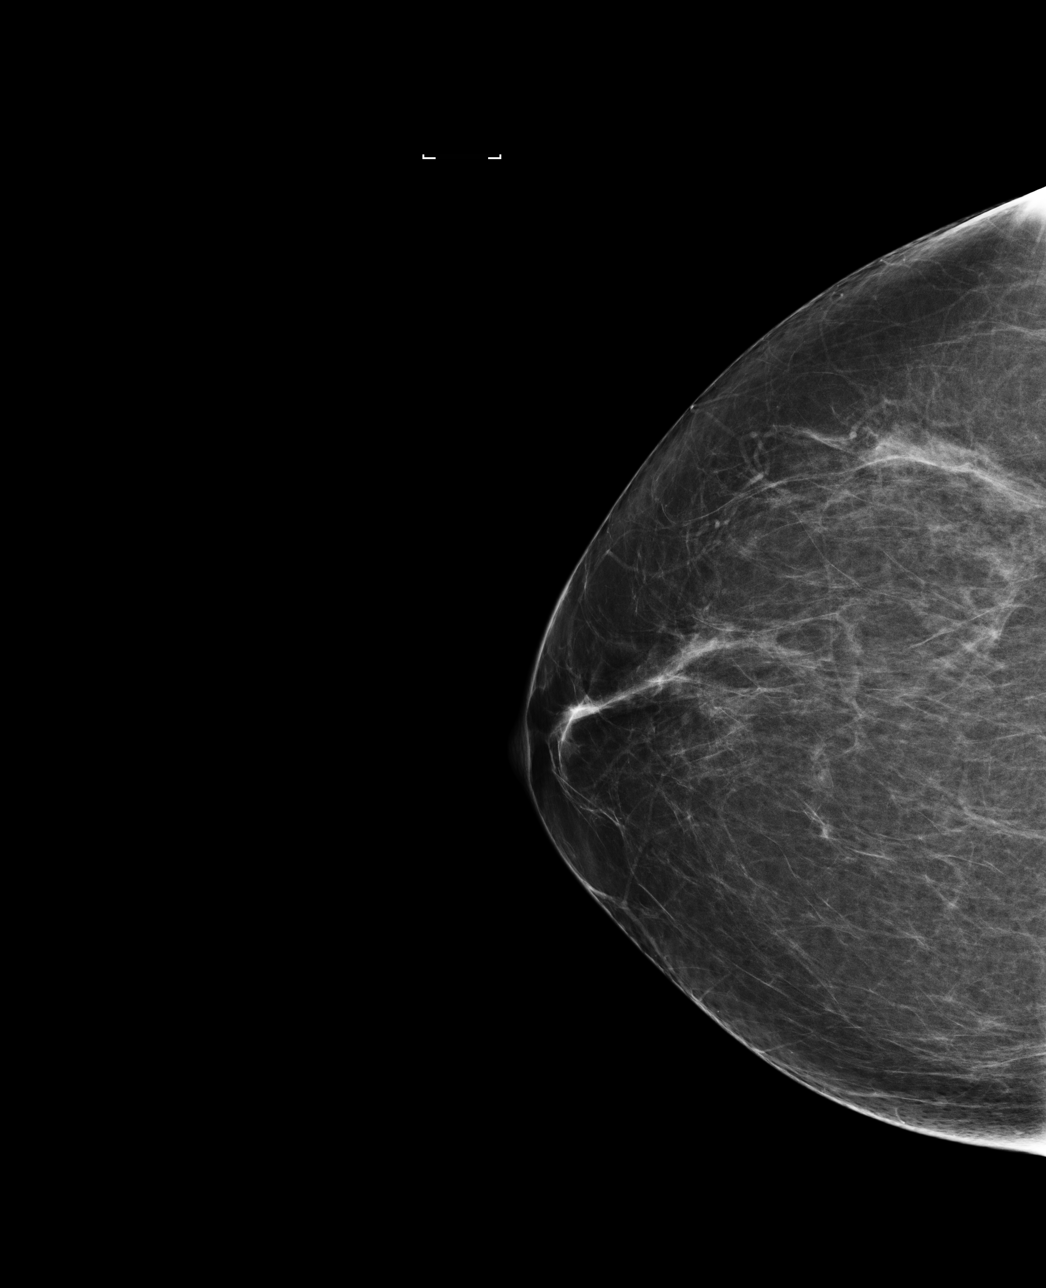

[L MLO]
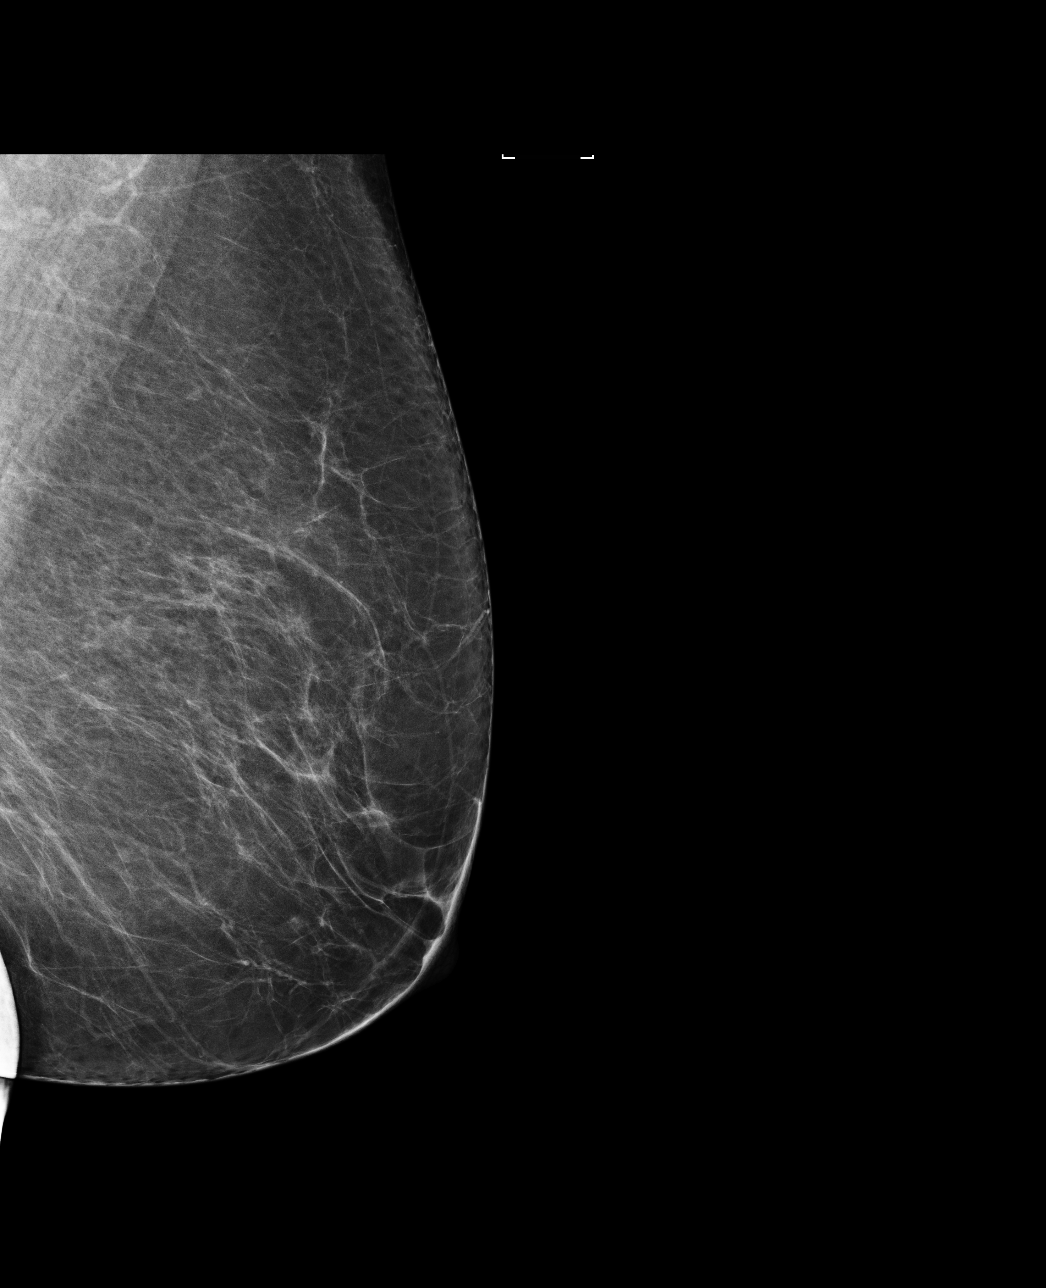

[L CC]
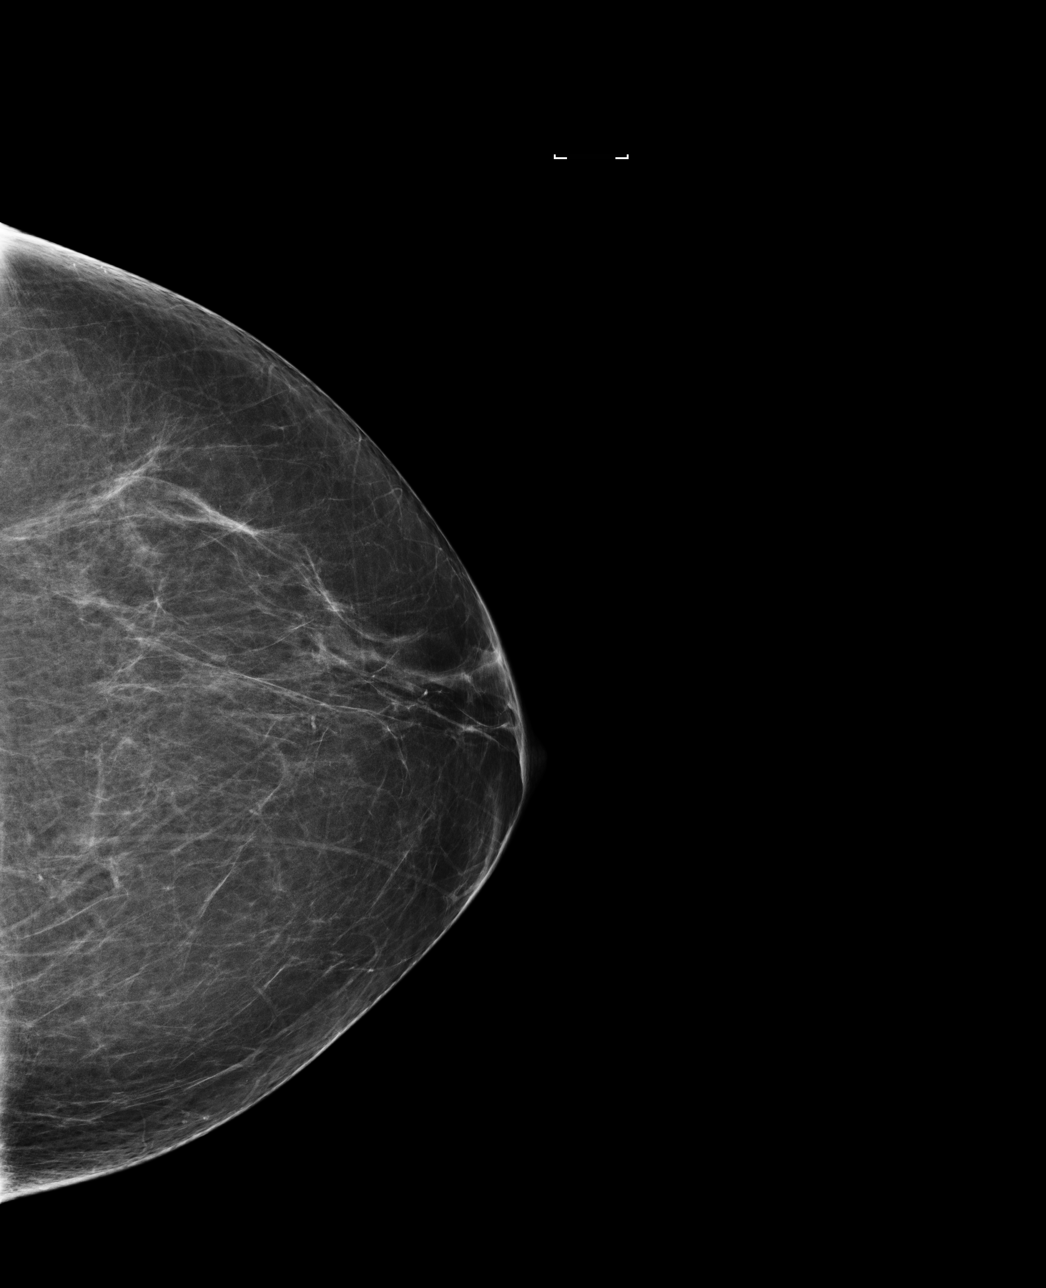

[4 of 4 positions shown; findings below may reference images not displayed]

ACR Breast Density Category b: There are scattered areas of
fibroglandular density.
FINDINGS: There are no findings suspicious for malignancy. Images were
processed with CAD.
IMPRESSION: No mammographic evidence of malignancy. A result letter of this
screening mammogram will be mailed directly to the patient.

RECOMMENDATION:
Screening mammogram in one year. (Code:[US])

BI-RADS CATEGORY  1: Negative.

## 2018-08-17 ENCOUNTER — Other Ambulatory Visit: Payer: Self-pay | Admitting: Family Medicine

## 2018-08-17 DIAGNOSIS — Z1231 Encounter for screening mammogram for malignant neoplasm of breast: Secondary | ICD-10-CM

## 2018-09-26 ENCOUNTER — Ambulatory Visit
Admission: RE | Admit: 2018-09-26 | Discharge: 2018-09-26 | Disposition: A | Discharge status: Home / Self Care | Payer: 59 | Source: Ambulatory Visit | Attending: Family Medicine | Admitting: Family Medicine

## 2018-09-26 ENCOUNTER — Ambulatory Visit: Payer: 59

## 2018-09-26 DIAGNOSIS — Z1231 Encounter for screening mammogram for malignant neoplasm of breast: Secondary | ICD-10-CM

## 2018-09-26 IMAGING — MG DIGITAL SCREENING BILATERAL MAMMOGRAM WITH TOMO AND CAD
8 series · 8 of 24 positions shown · non-contrast
Comparison: Previous exam(s).

CLINICAL DATA: Screening.

EXAM:
DIGITAL SCREENING BILATERAL MAMMOGRAM WITH TOMO AND CAD

[R MLO synth-2D]
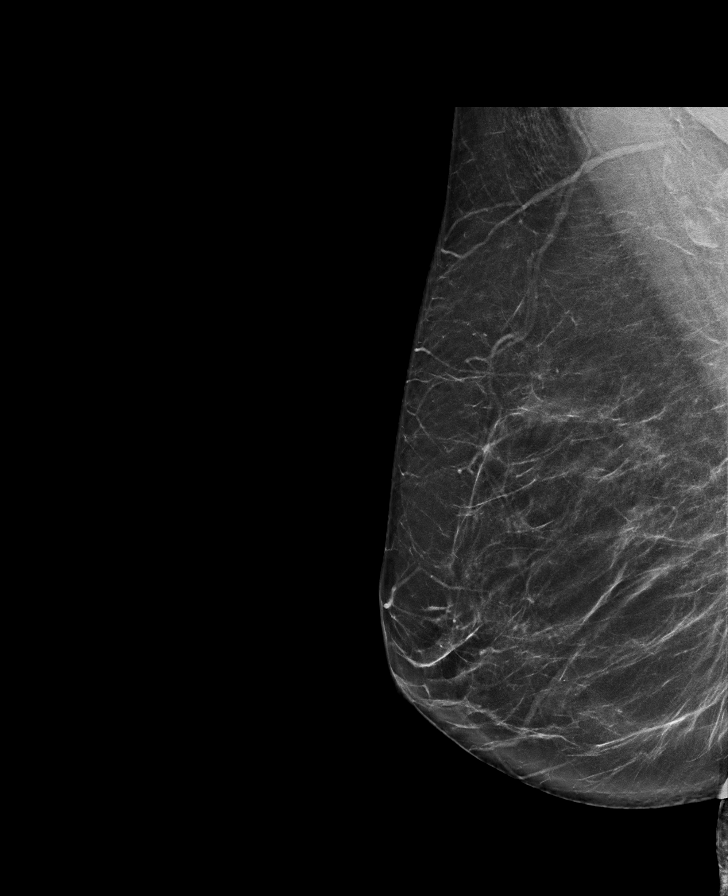

[L MLO synth-2D]
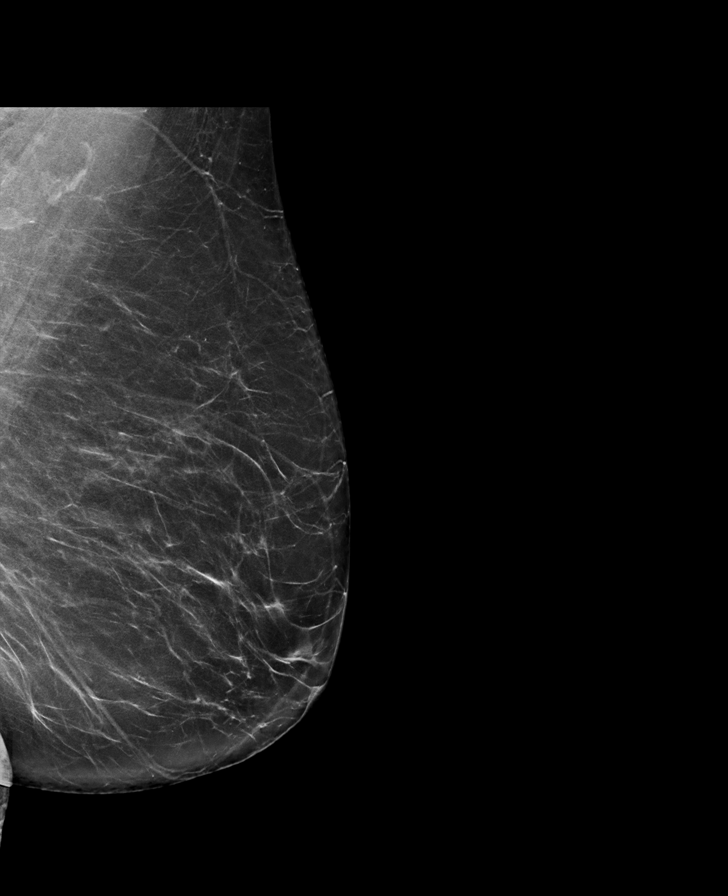

[R CC synth-2D]
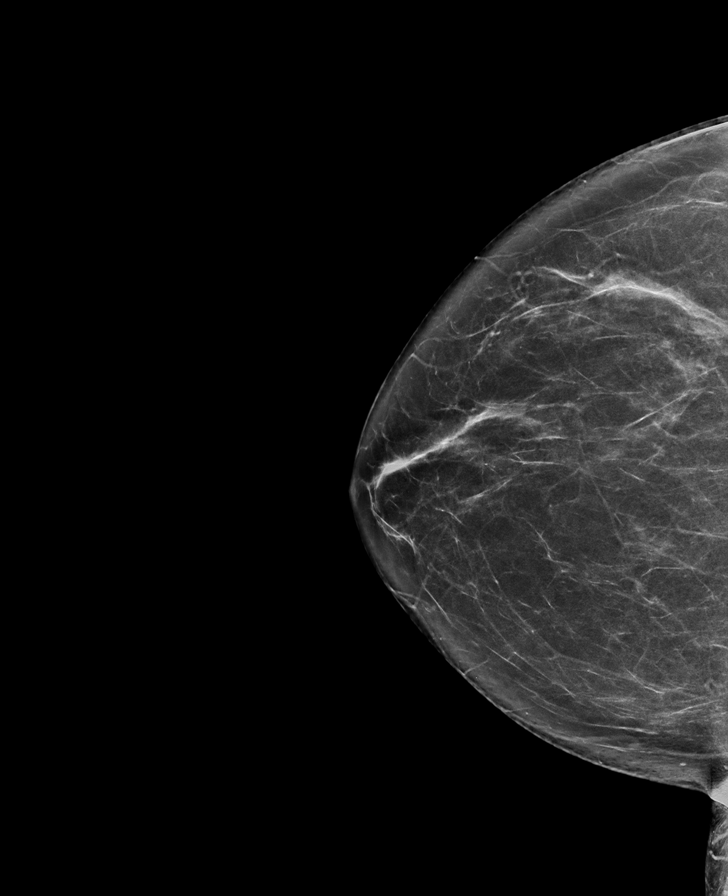

[L CC synth-2D]
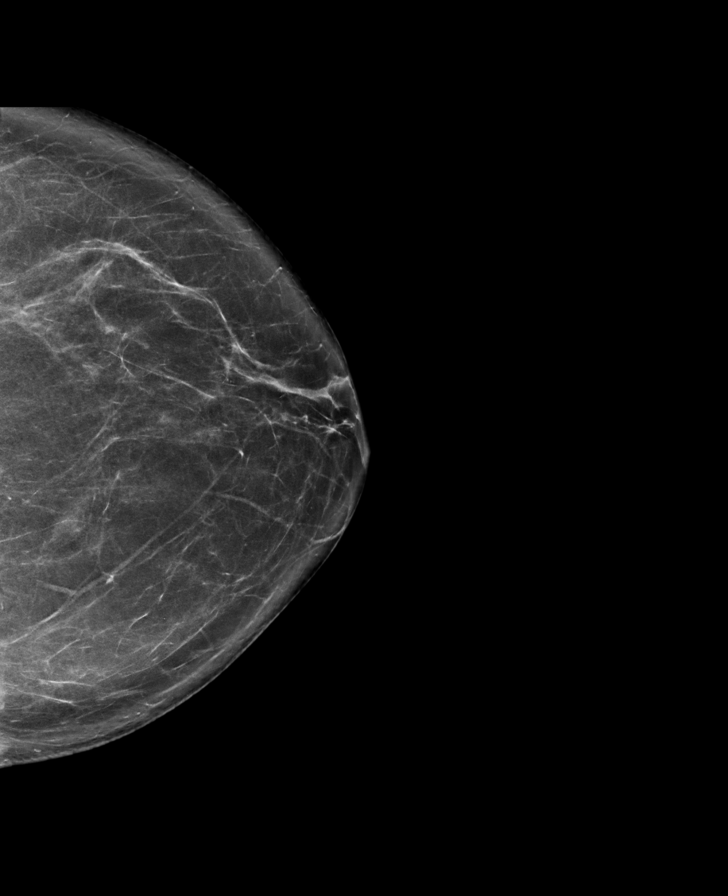

[R CC tomo · tomo slice 37/72.0]
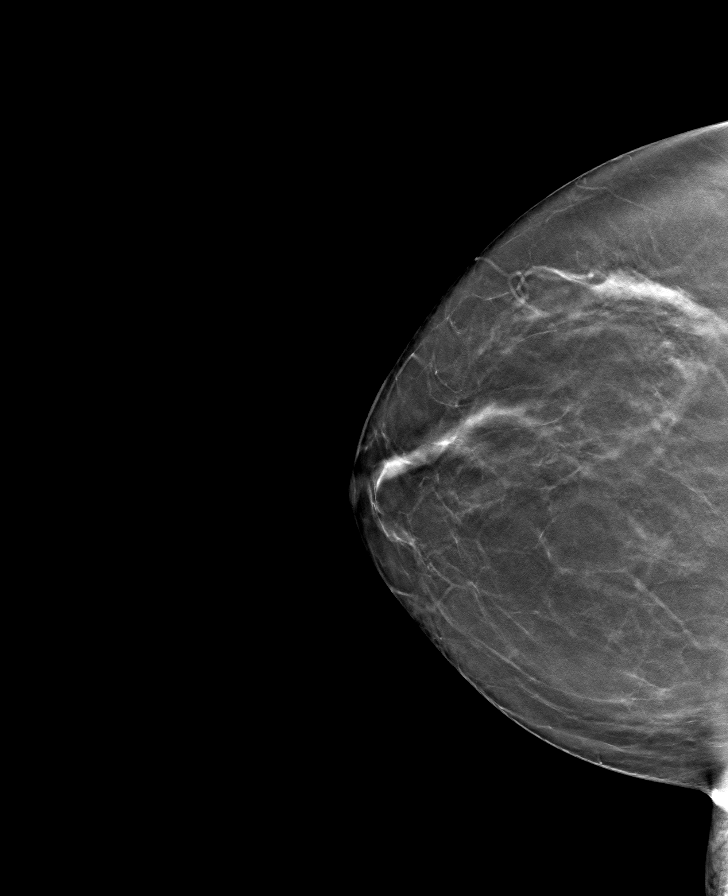

[L CC tomo · tomo slice 38/75.0]
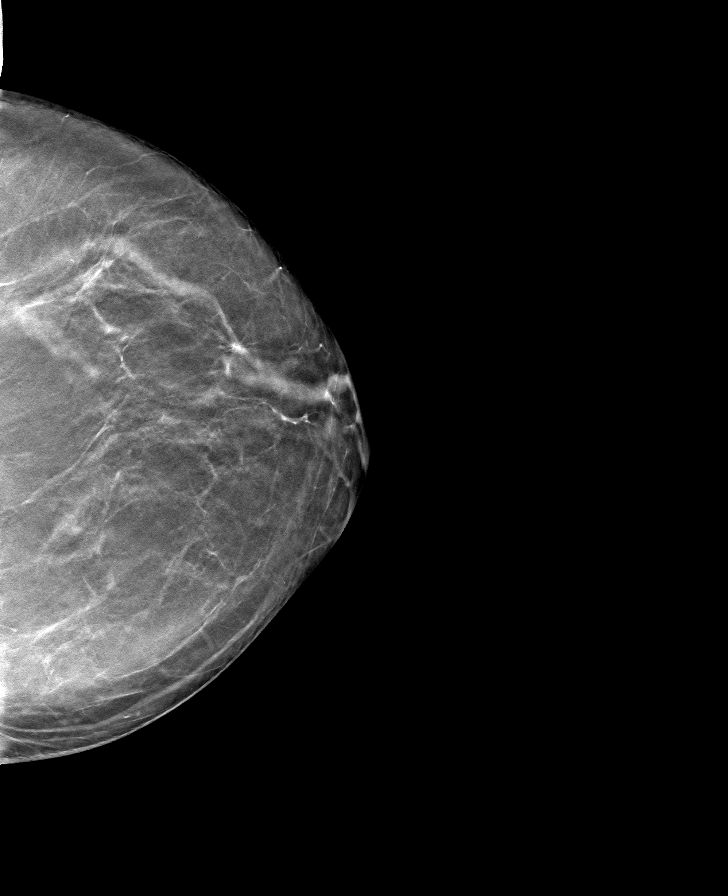

[L MLO tomo · tomo slice 39/78.0]
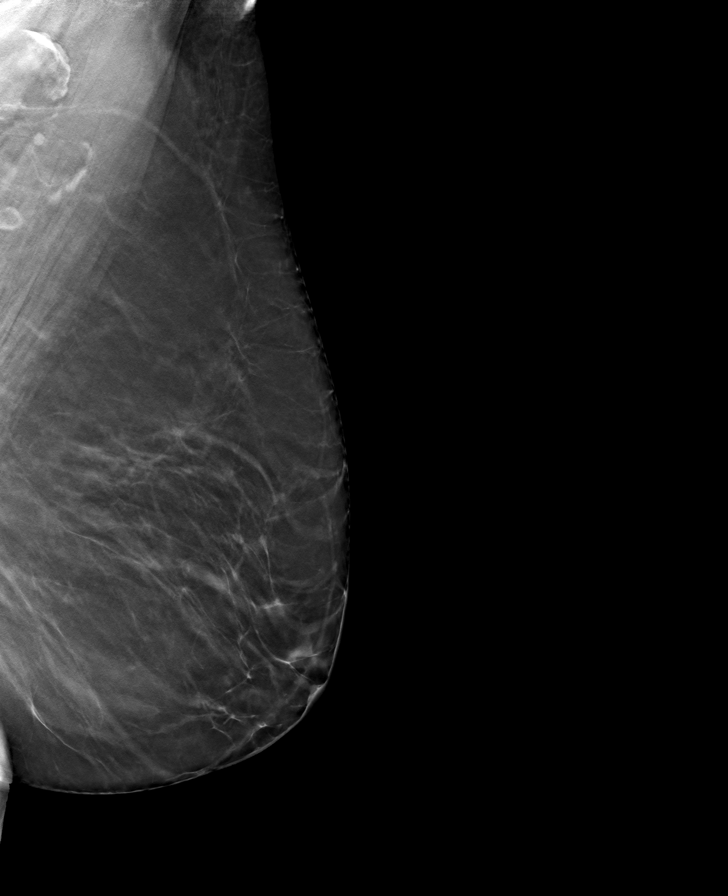

[R MLO tomo · tomo slice 39/76.0]
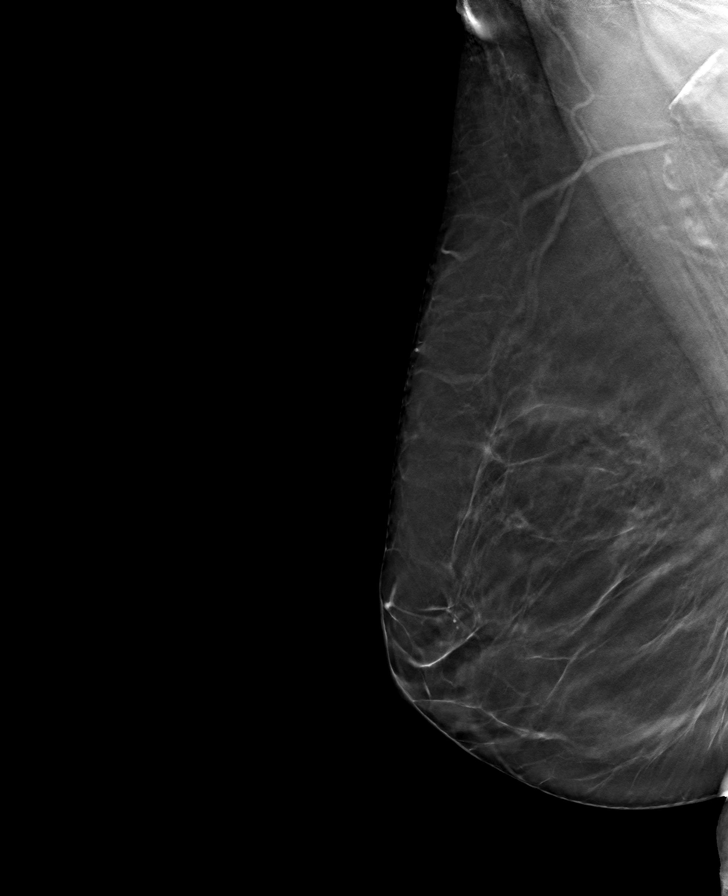

[8 of 24 positions shown; findings below may reference images not displayed]

ACR Breast Density Category b: There are scattered areas of
fibroglandular density.
FINDINGS: There are no findings suspicious for malignancy. Images were
processed with CAD.
IMPRESSION: No mammographic evidence of malignancy. A result letter of this
screening mammogram will be mailed directly to the patient.

RECOMMENDATION:
Screening mammogram in one year. (Code:[TQ])

BI-RADS CATEGORY  1: Negative.

## 2019-07-08 ENCOUNTER — Encounter: Payer: Self-pay | Admitting: Podiatry

## 2019-07-08 ENCOUNTER — Other Ambulatory Visit: Payer: Self-pay

## 2019-07-08 ENCOUNTER — Ambulatory Visit (INDEPENDENT_AMBULATORY_CARE_PROVIDER_SITE_OTHER): Payer: 59

## 2019-07-08 ENCOUNTER — Ambulatory Visit (INDEPENDENT_AMBULATORY_CARE_PROVIDER_SITE_OTHER): Payer: 59 | Admitting: Podiatry

## 2019-07-08 ENCOUNTER — Other Ambulatory Visit: Payer: Self-pay | Admitting: Podiatry

## 2019-07-08 VITALS — Temp 96.9°F | Resp 16

## 2019-07-08 DIAGNOSIS — M7732 Calcaneal spur, left foot: Secondary | ICD-10-CM

## 2019-07-08 DIAGNOSIS — M7662 Achilles tendinitis, left leg: Secondary | ICD-10-CM

## 2019-07-08 DIAGNOSIS — M9262 Juvenile osteochondrosis of tarsus, left ankle: Secondary | ICD-10-CM | POA: Diagnosis not present

## 2019-07-08 DIAGNOSIS — M24572 Contracture, left ankle: Secondary | ICD-10-CM | POA: Diagnosis not present

## 2019-07-08 DIAGNOSIS — M79672 Pain in left foot: Secondary | ICD-10-CM

## 2019-07-08 DIAGNOSIS — M79671 Pain in right foot: Secondary | ICD-10-CM

## 2019-07-08 MED ORDER — MELOXICAM 7.5 MG PO TABS: 7.5000 mg | ORAL_TABLET | Freq: Every day | ORAL | 1 refills | Status: DC

## 2019-07-08 NOTE — Patient Instructions (Signed)

## 2019-07-08 NOTE — Progress Notes (Signed)
Subjective:  Patient ID: Tammy Hernandez, female    DOB: 07-09-1956,  MRN: [id number]  Chief Complaint  Patient presents with  . Foot Pain    Lt back heel growth and pain x 1 yr; 8/10 shapr pains (comes and goes) -pt denies injury -pain worse at night Tx: biofreeze - pt denies swelling/redness    63 y.o. female presents with the above complaint. Hx as above.  Review of Systems: Negative except as noted in the HPI. Denies N/V/F/Ch.  Past Medical History:  Diagnosis Date  . Arthritis    neck, hips - no meds  . Cystocele   . Dysuria   . Hyperlipidemia   . IBS (irritable bowel syndrome)   . Osteoporosis   . Pelvic relaxation   . PMB (postmenopausal bleeding)   . PONV (postoperative nausea and vomiting)   . Seasonal allergies   . Thyroid disease   . Urinary frequency   . Urinary urgency   . Vaginal atrophy   . Vitamin D deficiency     Current Outpatient Medications:  .  alendronate (FOSAMAX) 70 MG tablet, Take 70 mg by mouth once a week., Disp: , Rfl:  .  atorvastatin (LIPITOR) 20 MG tablet, Take 20 mg by mouth daily., Disp: , Rfl:  .  dicyclomine (BENTYL) 10 MG capsule, Take 20 mg by mouth 3 (three) times daily. , Disp: , Rfl:  .  ergocalciferol (VITAMIN D2) 50000 UNITS capsule, Take 50,000 Units by mouth once a week. Saturdays, Disp: , Rfl:  .  levothyroxine (SYNTHROID, LEVOTHROID) 75 MCG tablet, Take 75 mcg by mouth daily., Disp: , Rfl:  .  meloxicam (MOBIC) 7.5 MG tablet, Take 1 tablet (7.5 mg total) by mouth daily. With food, Disp: 30 tablet, Rfl: 1 .  metFORMIN (GLUCOPHAGE) 500 MG tablet, Take 500 mg by mouth daily., Disp: , Rfl:  .  simvastatin (ZOCOR) 20 MG tablet, Take 20 mg by mouth every evening., Disp: , Rfl:   Social History   Tobacco Use  Smoking Status Former Smoker  . Packs/day: 0.25  . Years: 1.00  . Pack years: 0.25  . Types: Cigarettes  . Quit date: 05/05/1970  . Years since quitting: 49.2  Smokeless Tobacco Never Used    Allergies  Allergen  Reactions  . Sulfa Antibiotics    Objective:   Vitals:   07/08/19 0902  Resp: 16  Temp: (!) 96.9 F (36.1 C)   There is no height or weight on file to calculate BMI. Constitutional Well developed. Well nourished.  Vascular Dorsalis pedis pulses palpable bilaterally. Posterior tibial pulses palpable bilaterally. Capillary refill normal to all digits.  No cyanosis or clubbing noted. Pedal hair growth normal.  Neurologic Normal speech. Oriented to person, place, and time. Epicritic sensation to light touch grossly present bilaterally.  Dermatologic Nails well groomed and normal in appearance. No open wounds. No skin lesions.  Orthopedic: Normal joint ROM without pain or crepitus bilaterally. No visible deformities. Tender to palpation at the posterior calcaneus left  No pain with calcaneal squeeze left. Ankle ROM diminished range with pain left. Silfverskiold Test: positive left.   Radiographs: Taken and reviewed. No acute fractures or dislocations. No evidence of stress fracture.  Plantar heel spur present. Posterior heel spur present.   Assessment:   1. Haglund's deformity of left heel   2. Tendonitis, Achilles, left   3. Equinus contracture of left ankle   4. Heel spur, left    Plan:  Patient was evaluated and treated and  all questions answered.  Achilles Tendonitis, left -XR reviewed as above. -Educated on stretching and icing of the affected limb. -Night splint dispensed. -eRx for Meloxicam.  No follow-ups on file.

## 2019-07-11 ENCOUNTER — Other Ambulatory Visit: Payer: Self-pay | Admitting: Podiatry

## 2019-07-11 DIAGNOSIS — M79672 Pain in left foot: Secondary | ICD-10-CM

## 2019-07-11 DIAGNOSIS — M9262 Juvenile osteochondrosis of tarsus, left ankle: Secondary | ICD-10-CM

## 2019-08-19 ENCOUNTER — Ambulatory Visit (INDEPENDENT_AMBULATORY_CARE_PROVIDER_SITE_OTHER): Payer: 59 | Admitting: Podiatry

## 2019-08-19 ENCOUNTER — Other Ambulatory Visit: Payer: Self-pay

## 2019-08-19 DIAGNOSIS — M722 Plantar fascial fibromatosis: Secondary | ICD-10-CM | POA: Diagnosis not present

## 2019-08-19 DIAGNOSIS — M7662 Achilles tendinitis, left leg: Secondary | ICD-10-CM

## 2019-08-19 NOTE — Progress Notes (Signed)
Subjective:  Patient ID: Tammy Hernandez, female    DOB: 1956/06/23,  MRN: [id number]  Chief Complaint  Patient presents with  . Foot Pain    pt is here for a f/u on her plantar fasciitis, pt states that she is doing a lot better since the last time she was here, pt has been icing it, resting it, as well as using a heating paid, pt also states that she had to cut her meloxicam in half due to it messing up her stomach    63 y.o. female presents with the above complaint. Hx as above.  Review of Systems: Negative except as noted in the HPI. Denies N/V/F/Ch.  Past Medical History:  Diagnosis Date  . Arthritis    neck, hips - no meds  . Cystocele   . Dysuria   . Hyperlipidemia   . IBS (irritable bowel syndrome)   . Osteoporosis   . Pelvic relaxation   . PMB (postmenopausal bleeding)   . PONV (postoperative nausea and vomiting)   . Seasonal allergies   . Thyroid disease   . Urinary frequency   . Urinary urgency   . Vaginal atrophy   . Vitamin D deficiency     Current Outpatient Medications:  .  alendronate (FOSAMAX) 70 MG tablet, Take 70 mg by mouth once a week., Disp: , Rfl:  .  atorvastatin (LIPITOR) 20 MG tablet, Take 20 mg by mouth daily., Disp: , Rfl:  .  dicyclomine (BENTYL) 10 MG capsule, Take 20 mg by mouth 3 (three) times daily. , Disp: , Rfl:  .  ergocalciferol (VITAMIN D2) 50000 UNITS capsule, Take 50,000 Units by mouth once a week. Saturdays, Disp: , Rfl:  .  fluticasone (FLONASE) 50 MCG/ACT nasal spray, SPRAY 2 SPRAYS INTO EACH NOSTRIL EVERY DAY, Disp: , Rfl:  .  levothyroxine (SYNTHROID, LEVOTHROID) 75 MCG tablet, Take 75 mcg by mouth daily., Disp: , Rfl:  .  meloxicam (MOBIC) 7.5 MG tablet, Take 1 tablet (7.5 mg total) by mouth daily. With food, Disp: 30 tablet, Rfl: 1 .  metFORMIN (GLUCOPHAGE) 500 MG tablet, Take 500 mg by mouth daily., Disp: , Rfl:  .  simvastatin (ZOCOR) 20 MG tablet, Take 20 mg by mouth every evening., Disp: , Rfl:   Social History   Tobacco  Use  Smoking Status Former Smoker  . Packs/day: 0.25  . Years: 1.00  . Pack years: 0.25  . Types: Cigarettes  . Quit date: 05/05/1970  . Years since quitting: 49.3  Smokeless Tobacco Never Used    Allergies  Allergen Reactions  . Sulfa Antibiotics    Objective:   There were no vitals filed for this visit. There is no height or weight on file to calculate BMI. Constitutional Well developed. Well nourished.  Vascular Dorsalis pedis pulses palpable bilaterally. Posterior tibial pulses palpable bilaterally. Capillary refill normal to all digits.  No cyanosis or clubbing noted. Pedal hair growth normal.  Neurologic Normal speech. Oriented to person, place, and time. Epicritic sensation to light touch grossly present bilaterally.  Dermatologic Nails well groomed and normal in appearance. No open wounds. No skin lesions.  Orthopedic: Normal joint ROM without pain or crepitus bilaterally. No visible deformities. Tender to palpation at the posterior calcaneus left  Taut plantar fascia left No pain with calcaneal squeeze left. Ankle ROM diminished range with pain left. Silfverskiold Test: positive left.   Radiographs: None today  Assessment:   1. Tendonitis, Achilles, left   2. Plantar fasciitis  Plan:  Patient was evaluated and treated and all questions answered.  Achilles Tendonitis, left, plantar fasciitis -Continue stretching and icing -PF brace dispensed to support the plantar fascia while at work and in turn offload the Achilles tendon -Continue half dose meloxicam.  No follow-ups on file.

## 2019-08-29 ENCOUNTER — Other Ambulatory Visit: Payer: Self-pay | Admitting: Podiatry

## 2019-10-01 ENCOUNTER — Encounter (INDEPENDENT_AMBULATORY_CARE_PROVIDER_SITE_OTHER): Payer: Self-pay

## 2019-10-08 DIAGNOSIS — Z6827 Body mass index (BMI) 27.0-27.9, adult: Secondary | ICD-10-CM | POA: Diagnosis not present

## 2019-10-08 DIAGNOSIS — Z01419 Encounter for gynecological examination (general) (routine) without abnormal findings: Secondary | ICD-10-CM | POA: Diagnosis not present

## 2019-10-08 DIAGNOSIS — Z1231 Encounter for screening mammogram for malignant neoplasm of breast: Secondary | ICD-10-CM | POA: Diagnosis not present

## 2019-10-08 DIAGNOSIS — M8580 Other specified disorders of bone density and structure, unspecified site: Secondary | ICD-10-CM | POA: Diagnosis not present

## 2019-10-16 DIAGNOSIS — R69 Illness, unspecified: Secondary | ICD-10-CM | POA: Diagnosis not present

## 2019-11-20 DIAGNOSIS — K140 Glossitis: Secondary | ICD-10-CM | POA: Diagnosis not present

## 2019-11-20 DIAGNOSIS — Z9181 History of falling: Secondary | ICD-10-CM | POA: Diagnosis not present

## 2019-11-20 DIAGNOSIS — Z1331 Encounter for screening for depression: Secondary | ICD-10-CM | POA: Diagnosis not present

## 2019-11-20 DIAGNOSIS — Z6826 Body mass index (BMI) 26.0-26.9, adult: Secondary | ICD-10-CM | POA: Diagnosis not present

## 2019-12-03 DIAGNOSIS — R300 Dysuria: Secondary | ICD-10-CM | POA: Diagnosis not present

## 2019-12-03 DIAGNOSIS — Z6826 Body mass index (BMI) 26.0-26.9, adult: Secondary | ICD-10-CM | POA: Diagnosis not present

## 2019-12-31 DIAGNOSIS — H5203 Hypermetropia, bilateral: Secondary | ICD-10-CM | POA: Diagnosis not present

## 2020-01-06 DIAGNOSIS — L0390 Cellulitis, unspecified: Secondary | ICD-10-CM | POA: Diagnosis not present

## 2020-01-06 DIAGNOSIS — Z6826 Body mass index (BMI) 26.0-26.9, adult: Secondary | ICD-10-CM | POA: Diagnosis not present

## 2020-01-09 DIAGNOSIS — H25011 Cortical age-related cataract, right eye: Secondary | ICD-10-CM | POA: Diagnosis not present

## 2020-01-09 DIAGNOSIS — H2511 Age-related nuclear cataract, right eye: Secondary | ICD-10-CM | POA: Diagnosis not present

## 2020-01-10 DIAGNOSIS — R609 Edema, unspecified: Secondary | ICD-10-CM | POA: Diagnosis not present

## 2020-01-10 DIAGNOSIS — Z6826 Body mass index (BMI) 26.0-26.9, adult: Secondary | ICD-10-CM | POA: Diagnosis not present

## 2020-01-18 DIAGNOSIS — S0500XA Injury of conjunctiva and corneal abrasion without foreign body, unspecified eye, initial encounter: Secondary | ICD-10-CM | POA: Diagnosis not present

## 2020-01-20 DIAGNOSIS — S0501XA Injury of conjunctiva and corneal abrasion without foreign body, right eye, initial encounter: Secondary | ICD-10-CM | POA: Diagnosis not present

## 2020-01-27 DIAGNOSIS — Z6826 Body mass index (BMI) 26.0-26.9, adult: Secondary | ICD-10-CM | POA: Diagnosis not present

## 2020-01-27 DIAGNOSIS — H269 Unspecified cataract: Secondary | ICD-10-CM | POA: Diagnosis not present

## 2020-01-27 DIAGNOSIS — Z1331 Encounter for screening for depression: Secondary | ICD-10-CM | POA: Diagnosis not present

## 2020-01-28 DIAGNOSIS — Z01818 Encounter for other preprocedural examination: Secondary | ICD-10-CM | POA: Diagnosis not present

## 2020-01-30 DIAGNOSIS — H269 Unspecified cataract: Secondary | ICD-10-CM | POA: Diagnosis not present

## 2020-01-30 DIAGNOSIS — E1136 Type 2 diabetes mellitus with diabetic cataract: Secondary | ICD-10-CM | POA: Diagnosis not present

## 2020-01-30 DIAGNOSIS — H2589 Other age-related cataract: Secondary | ICD-10-CM | POA: Diagnosis not present

## 2020-01-30 DIAGNOSIS — I252 Old myocardial infarction: Secondary | ICD-10-CM | POA: Diagnosis not present

## 2020-01-30 DIAGNOSIS — Z87891 Personal history of nicotine dependence: Secondary | ICD-10-CM | POA: Diagnosis not present

## 2020-01-30 DIAGNOSIS — H2511 Age-related nuclear cataract, right eye: Secondary | ICD-10-CM | POA: Diagnosis not present

## 2020-01-30 DIAGNOSIS — E785 Hyperlipidemia, unspecified: Secondary | ICD-10-CM | POA: Diagnosis not present

## 2020-03-03 DIAGNOSIS — Z961 Presence of intraocular lens: Secondary | ICD-10-CM | POA: Diagnosis not present

## 2020-04-28 DIAGNOSIS — M79675 Pain in left toe(s): Secondary | ICD-10-CM | POA: Diagnosis not present

## 2020-04-28 DIAGNOSIS — W19XXXA Unspecified fall, initial encounter: Secondary | ICD-10-CM | POA: Diagnosis not present

## 2020-04-28 DIAGNOSIS — R0781 Pleurodynia: Secondary | ICD-10-CM | POA: Diagnosis not present

## 2020-04-28 DIAGNOSIS — M25532 Pain in left wrist: Secondary | ICD-10-CM | POA: Diagnosis not present

## 2020-04-28 DIAGNOSIS — M25432 Effusion, left wrist: Secondary | ICD-10-CM | POA: Diagnosis not present

## 2020-06-30 DIAGNOSIS — Z6827 Body mass index (BMI) 27.0-27.9, adult: Secondary | ICD-10-CM | POA: Diagnosis not present

## 2020-06-30 DIAGNOSIS — K146 Glossodynia: Secondary | ICD-10-CM | POA: Diagnosis not present

## 2020-10-06 DIAGNOSIS — Z2821 Immunization not carried out because of patient refusal: Secondary | ICD-10-CM | POA: Diagnosis not present

## 2020-10-06 DIAGNOSIS — Z23 Encounter for immunization: Secondary | ICD-10-CM | POA: Diagnosis not present

## 2020-10-06 DIAGNOSIS — M810 Age-related osteoporosis without current pathological fracture: Secondary | ICD-10-CM | POA: Diagnosis not present

## 2020-10-06 DIAGNOSIS — E1165 Type 2 diabetes mellitus with hyperglycemia: Secondary | ICD-10-CM | POA: Diagnosis not present

## 2020-10-06 DIAGNOSIS — E039 Hypothyroidism, unspecified: Secondary | ICD-10-CM | POA: Diagnosis not present

## 2020-10-06 DIAGNOSIS — Z6827 Body mass index (BMI) 27.0-27.9, adult: Secondary | ICD-10-CM | POA: Diagnosis not present

## 2020-11-11 ENCOUNTER — Ambulatory Visit (INDEPENDENT_AMBULATORY_CARE_PROVIDER_SITE_OTHER): Payer: 59 | Admitting: Otolaryngology

## 2020-11-11 ENCOUNTER — Encounter (INDEPENDENT_AMBULATORY_CARE_PROVIDER_SITE_OTHER): Payer: Self-pay | Admitting: Otolaryngology

## 2020-11-11 ENCOUNTER — Other Ambulatory Visit: Payer: Self-pay

## 2020-11-11 VITALS — Temp 96.3°F

## 2020-11-11 DIAGNOSIS — H9041 Sensorineural hearing loss, unilateral, right ear, with unrestricted hearing on the contralateral side: Secondary | ICD-10-CM | POA: Diagnosis not present

## 2020-11-11 DIAGNOSIS — M26609 Unspecified temporomandibular joint disorder, unspecified side: Secondary | ICD-10-CM | POA: Diagnosis not present

## 2020-11-11 DIAGNOSIS — K140 Glossitis: Secondary | ICD-10-CM

## 2020-11-11 DIAGNOSIS — H6123 Impacted cerumen, bilateral: Secondary | ICD-10-CM

## 2020-11-11 NOTE — Progress Notes (Addendum)
HPI: Tammy Hernandez is a 64 y.o. female who returns today for evaluation of soreness on the sides of her tongue anteriorly as well as in the back of her throat for 5 or 6 months.  She has a burning sensation on the sides of her tongue when she eats fruit which she likes to eat.  She also has chronic right TMJ problems and wears a mouthguard for the TMJ problems.  She complains of some tingling on the right side of her face which appears to be more neurologic and related to TMJ.  She also notices that she does not hear as well from the right ear.  She had hearing test performed 2 years ago that showed minimal hearing loss in the right compared to the left.  She also describes a fissure in the middle of her tongue.Marland Kitchen  Past Medical History:  Diagnosis Date  . Arthritis    neck, hips - no meds  . Cystocele   . Dysuria   . Hyperlipidemia   . IBS (irritable bowel syndrome)   . Osteoporosis   . Pelvic relaxation   . PMB (postmenopausal bleeding)   . PONV (postoperative nausea and vomiting)   . Seasonal allergies   . Thyroid disease   . Urinary frequency   . Urinary urgency   . Vaginal atrophy   . Vitamin D deficiency    Past Surgical History:  Procedure Laterality Date  . ANTERIOR AND POSTERIOR REPAIR  01/26/2012   Procedure: ANTERIOR (CYSTOCELE) AND POSTERIOR REPAIR (RECTOCELE);  Surgeon: Eli Hose, MD;  Location: [facility];  Service: Gynecology;  Laterality: N/A;  . BLADDER SUSPENSION  01/26/2012   Procedure: TRANSVAGINAL TAPE (TVT) PROCEDURE;  Surgeon: Eli Hose, MD;  Location: [facility];  Service: Gynecology;  Laterality: N/A;  TVT with Advantage Fit with cysto  . CARPAL TUNNEL RELEASE     left  . COLONOSCOPY    . ENDOMETRIAL BIOPSY  06/29/98  . KNEE ARTHROSCOPY     Bilateral   . SVD  1984   x 1  . TUBAL LIGATION    . VAGINAL HYSTERECTOMY  01/26/2012   Procedure: HYSTERECTOMY VAGINAL;  Surgeon: Eli Hose, MD;  Location: [facility];  Service: Gynecology;  Laterality: N/A;    Social History   Socioeconomic History  . Marital status: Married    Spouse name: Not on file  . Number of children: Not on file  . Years of education: Not on file  . Highest education level: Not on file  Occupational History  . Not on file  Tobacco Use  . Smoking status: Former Smoker    Packs/day: 0.25    Years: 1.00    Pack years: 0.25    Types: Cigarettes    Quit date: 05/05/1970    Years since quitting: 50.5  . Smokeless tobacco: Never Used  Substance and Sexual Activity  . Alcohol use: No  . Drug use: No  . Sexual activity: Not Currently    Birth control/protection: None, Surgical    Comment: BTL  Other Topics Concern  . Not on file  Social History Narrative  . Not on file   Social Determinants of Health   Financial Resource Strain:   . Difficulty of Paying Living Expenses: Not on file  Food Insecurity:   . Worried About Charity fundraiser in the Last Year: Not on file  . Ran Out of Food in the Last Year: Not on file  Transportation Needs:   .  Lack of Transportation (Medical): Not on file  . Lack of Transportation (Non-Medical): Not on file  Physical Activity:   . Days of Exercise per Week: Not on file  . Minutes of Exercise per Session: Not on file  Stress:   . Feeling of Stress : Not on file  Social Connections:   . Frequency of Communication with Friends and Family: Not on file  . Frequency of Social Gatherings with Friends and Family: Not on file  . Attends Religious Services: Not on file  . Active Member of Clubs or Organizations: Not on file  . Attends Archivist Meetings: Not on file  . Marital Status: Not on file   Family History  Problem Relation Age of Onset  . Heart attack Father        65  . Hypertension Mother    Allergies  Allergen Reactions  . Sulfa Antibiotics    Prior to Admission medications   Medication Sig Start Date End Date Taking? Authorizing Provider  alendronate (FOSAMAX) 70 MG tablet Take 70 mg by mouth  once a week. 05/22/19  Yes [provider]  atorvastatin (LIPITOR) 20 MG tablet Take 20 mg by mouth daily. 05/23/19  Yes [provider]  dicyclomine (BENTYL) 10 MG capsule Take 20 mg by mouth 3 (three) times daily.    Yes [provider]  ergocalciferol (VITAMIN D2) 50000 UNITS capsule Take 50,000 Units by mouth once a week. Saturdays   Yes [provider]  fluticasone (FLONASE) 50 MCG/ACT nasal spray SPRAY 2 SPRAYS INTO EACH NOSTRIL EVERY DAY 07/25/19  Yes [provider]  levothyroxine (SYNTHROID, LEVOTHROID) 75 MCG tablet Take 75 mcg by mouth daily.   Yes [provider]  meloxicam (MOBIC) 7.5 MG tablet TAKE 1 TABLET BY MOUTH EVERY DAY WITH FOOD 08/29/19  Yes Evelina Bucy, DPM  metFORMIN (GLUCOPHAGE) 500 MG tablet Take 500 mg by mouth daily. 04/30/19  Yes [provider]  simvastatin (ZOCOR) 20 MG tablet Take 20 mg by mouth every evening.   Yes [provider]     Positive ROS: Otherwise negative.  She has some occasional dizziness when she bends over but no true vertigo.  All other systems have been reviewed and were otherwise negative with the exception of those mentioned in the HPI and as above.  Physical Exam: Constitutional: Alert, well-appearing, no acute distress Ears: External ears without lesions or tenderness.  She has minimal wax buildup in both ears that was cleaned with curettes.  TMs were clear bilaterally with good mobility on pneumatic otoscopy.  Hearing screening with the 1024 tuning fork revealed good hearing in both ears perhaps a little bit better hearing in the left ear compared to the right.  AC > BC bilaterally.  Dix-Hallpike testing revealed no evidence of BPPV. Nasal: External nose without lesions.. Clear nasal passages Oral: Lips and gums without lesions. Tongue tongue has normal mobility and is soft to palpation.  There are no obvious tongue source although she had one little area on the right  side of her tongue that measured 1 to 2 mm and appears to be traumatic or slight leukoplakia.  She has a fissure of the tongue which is benign.  Tongue soft to palpation.  Palpation of the small little leukoplakic area on the right lateral tongue was nontender.  No signs of infection and no evidence of neoplasm. Neck: No palpable adenopathy or masses.  She does have some TMJ popping and cracking in the right  TMJ joint. Respiratory: Breathing comfortably  Skin: No facial/neck lesions or rash noted.  Cerumen impaction removal  Date/Time: 11/11/2020 11:07 AM Performed by: Rozetta Nunnery, MD Authorized by: Rozetta Nunnery, MD   Consent:    Consent obtained:  Verbal   Consent given by:  Patient   Risks discussed:  Pain and bleeding Procedure details:    Location:  L ear and R ear   Procedure type: curette   Post-procedure details:    Inspection:  TM intact and canal normal   Hearing quality:  Improved   Patient tolerance of procedure:  Tolerated well, no immediate complications Comments:     TMs are clear bilaterally.  Review of audiogram performed on 06/20/2018 revealed pure-tone hearing in the left ear at 10 dB and pure-tone hearing in the right ear at 15-20 dB.  Assessment: Mild glossitis. Hearing loss slightly worse in the right ear but this is minimal. Minimal wax buildup. Right TMJ dysfunction  Plan: Briefly discussed with her concerning her hearing problems in the right ear only treatment option for this would be hearing aids and discussed possibly repeat audiogram if she has enough hearing loss to warrant this. Ear canals were cleaned in the office today. Concerning the soreness on the sides of her tongue there is no evidence of neoplasia or signs of infection and recommended good oral hygiene as well as perhaps trying Biotene or mouth rinses such as Listerine or scope. She will follow-up as needed.   Radene Journey, MD

## 2021-01-05 DIAGNOSIS — H52223 Regular astigmatism, bilateral: Secondary | ICD-10-CM | POA: Diagnosis not present

## 2021-03-30 ENCOUNTER — Encounter: Payer: Self-pay | Admitting: Neurology

## 2021-03-30 ENCOUNTER — Telehealth: Payer: Self-pay | Admitting: Neurology

## 2021-03-30 ENCOUNTER — Ambulatory Visit (INDEPENDENT_AMBULATORY_CARE_PROVIDER_SITE_OTHER): Payer: Medicare HMO | Admitting: Neurology

## 2021-03-30 ENCOUNTER — Other Ambulatory Visit: Payer: Self-pay

## 2021-03-30 VITALS — BP 123/80 | HR 78 | Ht 63.0 in | Wt 156.0 lb

## 2021-03-30 DIAGNOSIS — R269 Unspecified abnormalities of gait and mobility: Secondary | ICD-10-CM

## 2021-03-30 DIAGNOSIS — G4719 Other hypersomnia: Secondary | ICD-10-CM | POA: Diagnosis not present

## 2021-03-30 DIAGNOSIS — R292 Abnormal reflex: Secondary | ICD-10-CM | POA: Diagnosis not present

## 2021-03-30 DIAGNOSIS — W19XXXA Unspecified fall, initial encounter: Secondary | ICD-10-CM | POA: Diagnosis not present

## 2021-03-30 DIAGNOSIS — R200 Anesthesia of skin: Secondary | ICD-10-CM | POA: Diagnosis not present

## 2021-03-30 DIAGNOSIS — R1310 Dysphagia, unspecified: Secondary | ICD-10-CM

## 2021-03-30 DIAGNOSIS — R2990 Unspecified symptoms and signs involving the nervous system: Secondary | ICD-10-CM | POA: Diagnosis not present

## 2021-03-30 DIAGNOSIS — R270 Ataxia, unspecified: Secondary | ICD-10-CM

## 2021-03-30 NOTE — Patient Instructions (Addendum)
MRI of the brain and cervical spine (after will order CT of the blood vessels of the head and neck) Physical Therapy for imbalance Swallow Study for dysphagia Sleep Study   Sleep Apnea Sleep apnea affects breathing during sleep. It causes breathing to stop for a short time or to become shallow. It can also increase the risk of:  Heart attack.  Stroke.  Being very overweight (obese).  Diabetes.  Heart failure.  Irregular heartbeat. The goal of treatment is to help you breathe normally again. What are the causes? There are three kinds of sleep apnea:  Obstructive sleep apnea. This is caused by a blocked or collapsed airway.  Central sleep apnea. This happens when the brain does not send the right signals to the muscles that control breathing.  Mixed sleep apnea. This is a combination of obstructive and central sleep apnea. The most common cause of this condition is a collapsed or blocked airway. This can happen if:  Your throat muscles are too relaxed.  Your tongue and tonsils are too large.  You are overweight.  Your airway is too small.   What increases the risk?  Being overweight.  Smoking.  Having a small airway.  Being older.  Being female.  Drinking alcohol.  Taking medicines to calm yourself (sedatives or tranquilizers).  Having family members with the condition. What are the signs or symptoms?  Trouble staying asleep.  Being sleepy or tired during the day.  Getting angry a lot.  Loud snoring.  Headaches in the morning.  Not being able to focus your mind (concentrate).  Forgetting things.  Less interest in sex.  Mood swings.  Personality changes.  Feelings of sadness (depression).  Waking up a lot during the night to pee (urinate).  Dry mouth.  Sore throat. How is this diagnosed?  Your medical history.  A physical exam.  A test that is done when you are sleeping (sleep study). The test is most often done in a sleep lab but  may also be done at home. How is this treated?  Sleeping on your side.  Using a medicine to get rid of mucus in your nose (decongestant).  Avoiding the use of alcohol, medicines to help you relax, or certain pain medicines (narcotics).  Losing weight, if needed.  Changing your diet.  Not smoking.  Using a machine to open your airway while you sleep, such as: ? An oral appliance. This is a mouthpiece that shifts your lower jaw forward. ? A CPAP device. This device blows air through a mask when you breathe out (exhale). ? An EPAP device. This has valves that you put in each nostril. ? A BPAP device. This device blows air through a mask when you breathe in (inhale) and breathe out.  Having surgery if other treatments do not work. It is important to get treatment for sleep apnea. Without treatment, it can lead to:  High blood pressure.  Coronary artery disease.  In men, not being able to have an erection (impotence).  Reduced thinking ability.   Follow these instructions at home: Lifestyle  Make changes that your doctor recommends.  Eat a healthy diet.  Lose weight if needed.  Avoid alcohol, medicines to help you relax, and some pain medicines.  Do not use any products that contain nicotine or tobacco, such as cigarettes, e-cigarettes, and chewing tobacco. If you need help quitting, ask your doctor. General instructions  Take over-the-counter and prescription medicines only as told by your doctor.  If  you were given a machine to use while you sleep, use it only as told by your doctor.  If you are having surgery, make sure to tell your doctor you have sleep apnea. You may need to bring your device with you.  Keep all follow-up visits as told by your doctor. This is important. Contact a doctor if:  The machine that you were given to use during sleep bothers you or does not seem to be working.  You do not get better.  You get worse. Get help right away if:  Your  chest hurts.  You have trouble breathing in enough air.  You have an uncomfortable feeling in your back, arms, or stomach.  You have trouble talking.  One side of your body feels weak.  A part of your face is hanging down. These symptoms may be an emergency. Do not wait to see if the symptoms will go away. Get medical help right away. Call your local emergency services (911 in the [place] Do not drive yourself to the hospital. Summary  This condition affects breathing during sleep.  The most common cause is a collapsed or blocked airway.  The goal of treatment is to help you breathe normally while you sleep. This information is not intended to replace advice given to you by your health care provider. Make sure you discuss any questions you have with your health care provider. Document Revised: 09/07/2018 Document Reviewed: 07/17/2018 Elsevier Patient Education  2021 [place] Prevention in the Home, Adult Falls can cause injuries and can happen to people of all ages. There are many things you can do to make your home safe and to help prevent falls. Ask for help when making these changes. What actions can I take to prevent falls? General Instructions  Use good lighting in all rooms. Replace any light bulbs that burn out.  Turn on the lights in dark areas. Use night-lights.  Keep items that you use often in easy-to-reach places. Lower the shelves around your home if needed.  Set up your furniture so you have a clear path. Avoid moving your furniture around.  Do not have throw rugs or other things on the floor that can make you trip.  Avoid walking on wet floors.  If any of your floors are uneven, fix them.  Add color or contrast paint or tape to clearly mark and help you see: ? Grab bars or handrails. ? First and last steps of staircases. ? Where the edge of each step is.  If you use a stepladder: ? Make sure that it is fully opened. Do not climb a closed  stepladder. ? Make sure the sides of the stepladder are locked in place. ? Ask someone to hold the stepladder while you use it.  Know where your pets are when moving through your home. What can I do in the bathroom?  Keep the floor dry. Clean up any water on the floor right away.  Remove soap buildup in the tub or shower.  Use nonskid mats or decals on the floor of the tub or shower.  Attach bath mats securely with double-sided, nonslip rug tape.  If you need to sit down in the shower, use a plastic, nonslip stool.  Install grab bars by the toilet and in the tub and shower. Do not use towel bars as grab bars.      What can I do in the bedroom?  Make sure that you have a light by  your bed that is easy to reach.  Do not use any sheets or blankets for your bed that hang to the floor.  Have a firm chair with side arms that you can use for support when you get dressed. What can I do in the kitchen?  Clean up any spills right away.  If you need to reach something above you, use a step stool with a grab bar.  Keep electrical cords out of the way.  Do not use floor polish or wax that makes floors slippery. What can I do with my stairs?  Do not leave any items on the stairs.  Make sure that you have a light switch at the top and the bottom of the stairs.  Make sure that there are handrails on both sides of the stairs. Fix handrails that are broken or loose.  Install nonslip stair treads on all your stairs.  Avoid having throw rugs at the top or bottom of the stairs.  Choose a carpet that does not hide the edge of the steps on the stairs.  Check carpeting to make sure that it is firmly attached to the stairs. Fix carpet that is loose or worn. What can I do on the outside of my home?  Use bright outdoor lighting.  Fix the edges of walkways and driveways and fix any cracks.  Remove anything that might make you trip as you walk through a door, such as a raised step or  threshold.  Trim any bushes or trees on paths to your home.  Check to see if handrails are loose or broken and that both sides of all steps have handrails.  Install guardrails along the edges of any raised decks and porches.  Clear paths of anything that can make you trip, such as tools or rocks.  Have leaves, snow, or ice cleared regularly.  Use sand or salt on paths during winter.  Clean up any spills in your garage right away. This includes grease or oil spills. What other actions can I take?  Wear shoes that: ? Have a low heel. Do not wear high heels. ? Have rubber bottoms. ? Feel good on your feet and fit well. ? Are closed at the toe. Do not wear open-toe sandals.  Use tools that help you move around if needed. These include: ? Canes. ? Walkers. ? Scooters. ? Crutches.  Review your medicines with your doctor. Some medicines can make you feel dizzy. This can increase your chance of falling. Ask your doctor what else you can do to help prevent falls. Where to find more information  Centers for Disease Control and Prevention, [employer]: http://www.wolf.info/  National Institute on Aging: http://kim-miller.com/ Contact a doctor if:  You are afraid of falling at home.  You feel weak, drowsy, or dizzy at home.  You fall at home. Summary  There are many simple things that you can do to make your home safe and to help prevent falls.  Ways to make your home safe include removing things that can make you trip and installing grab bars in the bathroom.  Ask for help when making these changes in your home. This information is not intended to replace advice given to you by your health care provider. Make sure you discuss any questions you have with your health care provider. Document Revised: 06/24/2020 Document Reviewed: 06/24/2020 Elsevier Patient Education  [place].

## 2021-03-30 NOTE — Progress Notes (Addendum)
[id number]    Provider:  Dr Jaynee Eagles Requesting Provider: Ronita Hipps, MD Primary Care Provider:  Ronita Hipps, MD  CC:  Dizziness  HPI:  Tammy Hernandez is a 66 y.o. female here as requested by Ronita Hipps, MD for dizziness.  Past medical history type 2 diabetes with hyperglycemia, HLD, Thyroid disease.  Patient feels that she is off balance, some days are worse than others, she states she will walk across the yard and then "veer off", the daughter states she fell and hit her head, she has been worse since then, patient fell about 2 months ago.  Patient says she has imbalance, she fell several times, she was at work and bent over and fell, she fell in the house and once in the bedroom and hit her head. More imbalance then dizziness. She can be walking and then stagger. She doesn't get dizzy causing her to fall. She gets sleepy in the afternoon and takes a little nap, started 6 months to a year ago, worsenig "real bad" per daughter who provides much information, she bumped her head but no trauma, some days she feels less balanced when moving quickly. No weakness. She ahs a little numbness on the right side of the face. Her mother had strokes. She has some neck pain on the right side biofreeze helps, but no numbness in the arms or legs. No parkinson's diease in the family. No shaking or stiffness. Daughter says she gets tired very easy, she walks and staggers, she bumps into things, she just went to the eye doctor, random, she is off balance every day. 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 Slowly progressive. When she lays down at night the room spins, one day she tried to stand up and put her pants on and she fell. One day she was coming home fro work and she was dizzy.    Reviewed notes, labs and imaging from outside physicians, which showed:  Blood work collected October 06, 2020 includes a CBC which is normal, BMP with BUN 16 and creatinine 0.6, slightly elevated BUN to creatinine ratio otherwise normal.  AST and ALT are both 20 normal, free T4 within normal limits, TSH 0.34 slightly low, vitamin D 47.  I reviewed Dr. Greggory Keen notes: She has been having issues with her balance, she is trying to walk straight but she will run to the side, she will feel a bit dizzy if she jumps up too fast, they discussed referral to physical therapy for balance training and gait training, patient declined, she finally agreed to go to neurology.  Review of Systems: Patient complains of symptoms per HPI as well as the following symptoms: fatigue. Pertinent negatives and positives per HPI. All others negative.   Social History   Socioeconomic History  . Marital status: Married    Spouse name: Not on file  . Number of children: Not on file  . Years of education: Not on file  . Highest education level: GED or equivalent  Occupational History  . Not on file  Tobacco Use  . Smoking status: Former Smoker    Packs/day: 0.25    Years: 1.00    Pack years: 0.25    Types: Cigarettes  Quit date: 05/05/1970    Years since quitting: 50.9  . Smokeless tobacco: Never Used  Substance and Sexual Activity  . Alcohol use: Never  . Drug use: Never  . Sexual activity: Not Currently    Birth control/protection: None, Surgical    Comment: BTL  Other Topics Concern  . Not on file  Social History Narrative   Lives at home with husband   Right handed   Caffeine: 1 cup/day   Social Determinants of Health   Financial Resource Strain: Not on file  Food Insecurity: Not on file  Transportation Needs: Not on file  Physical Activity: Not on file  Stress: Not on file  Social Connections: Not on file  Intimate Partner Violence: Not on file    Family History  Problem Relation Age of  Onset  . Heart attack Father        17  . Hypertension Mother   . Stroke Other        maternal side of family  . Aneurysm Other        maternal side of family    Past Medical History:  Diagnosis Date  . Arthritis    neck, hips - no meds  . Cystocele   . Diabetes ([place])   . Dysuria   . Hyperlipidemia   . IBS (irritable bowel syndrome)   . Osteoporosis   . Pelvic relaxation   . PMB (postmenopausal bleeding)   . PONV (postoperative nausea and vomiting)   . Seasonal allergies   . Thyroid disease   . Urinary frequency   . Urinary urgency   . Vaginal atrophy   . Vitamin D deficiency     Patient Active Problem List   Diagnosis Date Noted  . Gait abnormality 03/30/2021  . Fall 03/30/2021  . Nocturia 07/09/2012    Past Surgical History:  Procedure Laterality Date  . ANTERIOR AND POSTERIOR REPAIR  01/26/2012   Procedure: ANTERIOR (CYSTOCELE) AND POSTERIOR REPAIR (RECTOCELE);  Surgeon: Eli Hose, MD;  Location: [facility];  Service: Gynecology;  Laterality: N/A;  . BLADDER SUSPENSION  01/26/2012   Procedure: TRANSVAGINAL TAPE (TVT) PROCEDURE;  Surgeon: Eli Hose, MD;  Location: [facility];  Service: Gynecology;  Laterality: N/A;  TVT with Advantage Fit with cysto  . CARPAL TUNNEL RELEASE     left  . COLONOSCOPY    . ENDOMETRIAL BIOPSY  06/29/98  . KNEE ARTHROSCOPY     Bilateral   . SVD  1984   x 1  . TUBAL LIGATION    . VAGINAL HYSTERECTOMY  01/26/2012   Procedure: HYSTERECTOMY VAGINAL;  Surgeon: Eli Hose, MD;  Location: [facility];  Service: Gynecology;  Laterality: N/A;    Current Outpatient Medications  Medication Sig Dispense Refill  . alendronate (FOSAMAX) 70 MG tablet Take 70 mg by mouth once a week.    . ALPRAZolam (XANAX) 0.25 MG tablet Take 1-2 tabs (0.25mg -0.50mg ) 30-60 minutes before procedure. May repeat if needed.Do not drive. 4 tablet 0  . atorvastatin (LIPITOR) 20 MG tablet Take 20 mg by mouth daily.    . B Complex Vitamins (B COMPLEX PO)  Take by mouth daily.    Marland Kitchen CALCIUM PO Take 600 mg by mouth in the morning and at bedtime.    . dicyclomine (BENTYL) 10 MG capsule Take 10 mg by mouth 3 (three) times daily.    . ergocalciferol (VITAMIN D2) 50000 UNITS capsule Take 50,000 Units by mouth once a week. Saturdays    .  fluticasone (FLONASE) 50 MCG/ACT nasal spray in the morning and at bedtime.    Marland Kitchen levothyroxine (SYNTHROID, LEVOTHROID) 75 MCG tablet Take 75 mcg by mouth daily.    . metFORMIN (GLUCOPHAGE) 500 MG tablet Take 500 mg by mouth daily.     No current facility-administered medications for this visit.    Allergies as of 03/30/2021 - Review Complete 03/30/2021  Allergen Reaction Noted  . Sulfa antibiotics  07/05/2012    Vitals: BP 123/80 (BP Location: Left Arm, Patient Position: Standing)   Pulse 78   Ht 5\' 3"  (1.6 m)   Wt 156 lb (70.8 kg)   BMI 27.63 kg/m  Last Weight:  Wt Readings from Last 1 Encounters:  03/30/21 156 lb (70.8 kg)   Last Height:   Ht Readings from Last 1 Encounters:  03/30/21 5\' 3"  (1.6 m)     Physical exam: Exam: Gen: NAD, conversant, well nourised, well groomed                     CV: RRR, no MRG. No Carotid Bruits. No peripheral edema, warm, nontender Eyes: Conjunctivae clear without exudates or hemorrhage  Neuro: Detailed Neurologic Exam  Speech:    Speech is normal; fluent and spontaneous with normal comprehension.  Cognition:    The patient is oriented to person, place, and time;     recent and remote memory intact;     language fluent;     normal attention, concentration,     fund of knowledge Cranial Nerves:    The pupils are equal, round, and reactive to light. Pupils too small to visualize fundi.  Visual fields are full to finger confrontation. Extraocular movements are intact. Trigeminal sensation is decreased on the lower right face. The face is symmetric. The palate elevates in the midline. Hearing intact. Voice is normal. Shoulder shrug is normal. The tongue has  normal motion without fasciculations.   Coordination:    Normal finger to nose.  Gait:    Slight wide based, decreased bilateral arm swing, stumbled/staggered on walking, good turn but couldn't tandem almost fell, not shuffling, slightly ataxia  Motor Observation:    No asymmetry, no atrophy, and no involuntary movements noted. Tone:    Normal muscle tone.    Posture:    Posture is normal. normal erect    Strength:    Strength is V/V in the upper and lower limbs.      Sensation: intact to LT. Negative Romberg      Reflex Exam:  DTR's:    Deep tendon reflexes in the upper and lower extremities are brisk bilaterally.   Toes:    The toes are downgoing bilaterally.   Clonus:    Clonus is absent.  Possible hoffman's on the left  Assessment/Plan:  65 y.o. female here as requested by Ronita Hipps, MD for dizziness.  Past medical history type 2 diabetes with hyperglycemia, HLD, Thyroid disease.  Patient feels that she is off balance, some days are worse than others, she states she will walk across the yard and then "veer off", gets dizzy positionally, she has vertigo in bed at night sometimes, dizzy when she moves her head, she has been having many falls, imbalance every day,  Multiple falls, imbalance daily.  Having problems swallowing.   Addendum 04/13/2021: Speech and swallow recommend esophageal assessment after abnormal barium swallow.Referral to ENT.   -Patient has difficulty with gait and I do not think that dizziness/vertigo is a prominent factor, slightly staggering  here in the office possibly ataxic, cannot perform tandem without near fall today in the office, strength exam is normal with normal sensation in the arms and legs and negative Romberg however she has very brisk reflexes throughout, and possible Hoffmann sign in the left, and obvious gait abnormality.  She has decreased arm swing but overall not parkinsonian.  Possible etiologies could be cerebrovascular disease with  gait apraxia, cervical stenosis with myelopathy causing ataxia or other cervical spine or upper motor neuron lesion, she has a history of multiple vascular risk factors and cerebral aneurysms in the family if she does have significant vascular disease we may consider CT angio of the head and neck.  Need MRI of the brain and cervical spine definitely. - Lives in [place], [place]. Physical therapy for imbalance and falls - Swallow stdy for dysphagia.  No other signs or symptoms of motor neuron disease, facial strength is normal, she does have some decrease sensation on the lower right face which sounds more trigeminal than central but again need MRIs. -ESS of 21, needs sleep evaluation, significantly fatigued, falling asleep all day, snores heavily. - fall risk, discussed.     Orders Placed This Encounter  Procedures  . MR BRAIN W WO CONTRAST  . MR CERVICAL SPINE WO CONTRAST  . DG OP Swallowing Func-Medicare/Speech Path  . Basic Metabolic Panel  . Ambulatory referral to Sleep Studies  . Ambulatory referral to Physical Therapy  . SLP modified barium swallow   Meds ordered this encounter  Medications  . ALPRAZolam (XANAX) 0.25 MG tablet    Sig: Take 1-2 tabs (0.25mg -0.50mg ) 30-60 minutes before procedure. May repeat if needed.Do not drive.    Dispense:  4 tablet    Refill:  0    Cc: Ronita Hipps, MD,  Ronita Hipps, MD  Sarina Ill, MD  [facility] [address] [place], [place] [address]  Phone [phone] Fax [phone]

## 2021-03-30 NOTE — Telephone Encounter (Signed)
[employer] medicare/[employer] order sent to GI. They will obtain the auth and reach out to the patient to schedule.

## 2021-03-31 LAB — BASIC METABOLIC PANEL
BUN/Creatinine Ratio: 18 (ref 12–28)
BUN: 14 mg/dL (ref 8–27)
CO2: 24 mmol/L (ref 20–29)
Calcium: 10.4 mg/dL — ABNORMAL HIGH (ref 8.7–10.3)
Chloride: 103 mmol/L (ref 96–106)
Creatinine, Ser: 0.76 mg/dL (ref 0.57–1.00)
Glucose: 93 mg/dL (ref 65–99)
Potassium: 4.6 mmol/L (ref 3.5–5.2)
Sodium: 142 mmol/L (ref 134–144)
eGFR: 86 mL/min/{1.73_m2} (ref >59)

## 2021-03-31 MED ORDER — ALPRAZOLAM 0.25 MG PO TABS: ORAL_TABLET | ORAL | 0 refills | Status: AC

## 2021-03-31 NOTE — Addendum Note (Signed)
Addended by: Sarina Ill B on: 03/31/2021 10:48 AM   Modules accepted: Orders

## 2021-04-09 ENCOUNTER — Other Ambulatory Visit: Payer: Self-pay

## 2021-04-09 ENCOUNTER — Ambulatory Visit (HOSPITAL_COMMUNITY)
Admission: RE | Admit: 2021-04-09 | Discharge: 2021-04-09 | Disposition: A | Discharge status: Home / Self Care | Payer: 59 | Source: Ambulatory Visit | Attending: Neurology | Admitting: Neurology

## 2021-04-09 DIAGNOSIS — R1310 Dysphagia, unspecified: Secondary | ICD-10-CM | POA: Insufficient documentation

## 2021-04-09 DIAGNOSIS — R1313 Dysphagia, pharyngeal phase: Secondary | ICD-10-CM

## 2021-04-09 DIAGNOSIS — R059 Cough, unspecified: Secondary | ICD-10-CM | POA: Diagnosis not present

## 2021-04-09 DIAGNOSIS — K219 Gastro-esophageal reflux disease without esophagitis: Secondary | ICD-10-CM | POA: Diagnosis not present

## 2021-04-09 IMAGING — RF DG SWALLOWING FUNCTION
12 of 19 series · 12 of 24 positions shown · non-contrast
Comparison: None.

CLINICAL DATA: Dysphagia. Cough/GE reflux disease/other secondary
diagnosis

EXAM:
MODIFIED BARIUM SWALLOW
TECHNIQUE: Different consistencies of barium were administered orally to the
patient by the Speech Pathologist. Imaging of the pharynx was
performed in the lateral projection. The radiologist was present in
the fluoroscopy room for this study, providing personal supervision.
FLUOROSCOPY TIME:  Fluoroscopy Time:  1 minute
Radiation Exposure Index (if provided by the fluoroscopic device):
6.17 mGy

[Series 1: run · 1 of 56 frames shown (1 of 12)]
[frame 48/56]
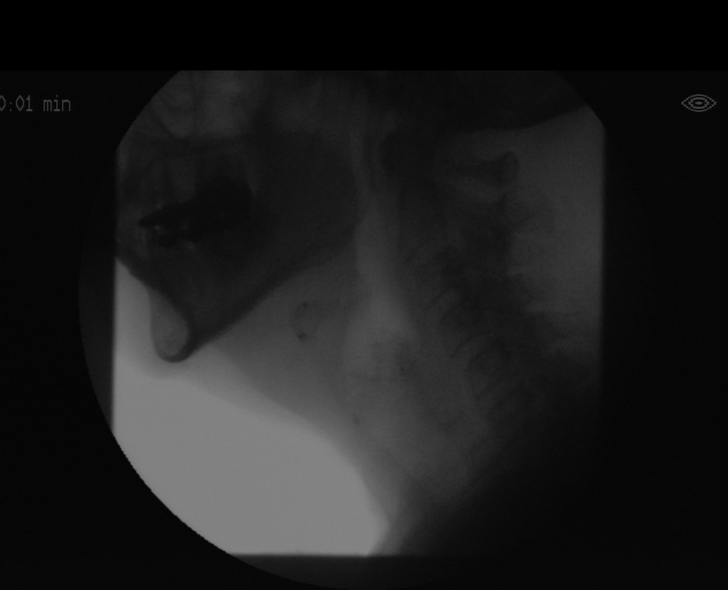

[Series 3: run · 1 of 87 frames shown (2 of 12)]
[frame 44/87]
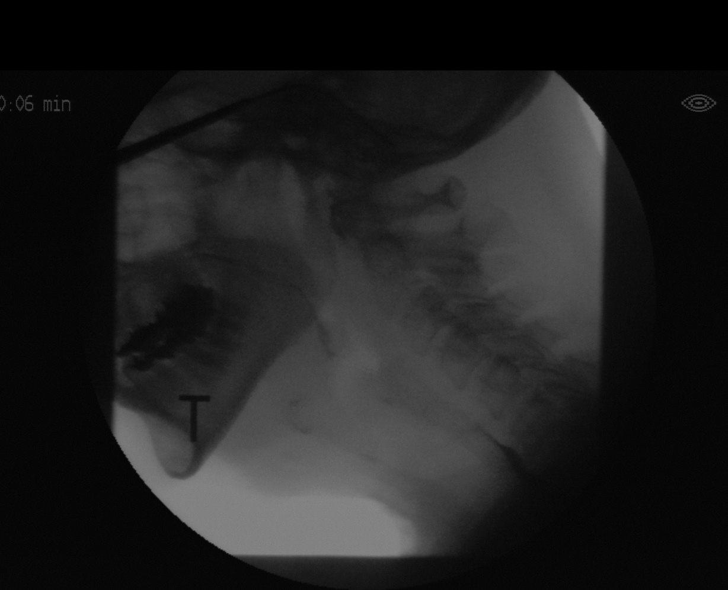

[Series 5: run · 1 of 53 frames shown (3 of 12)]
[frame 8/53]
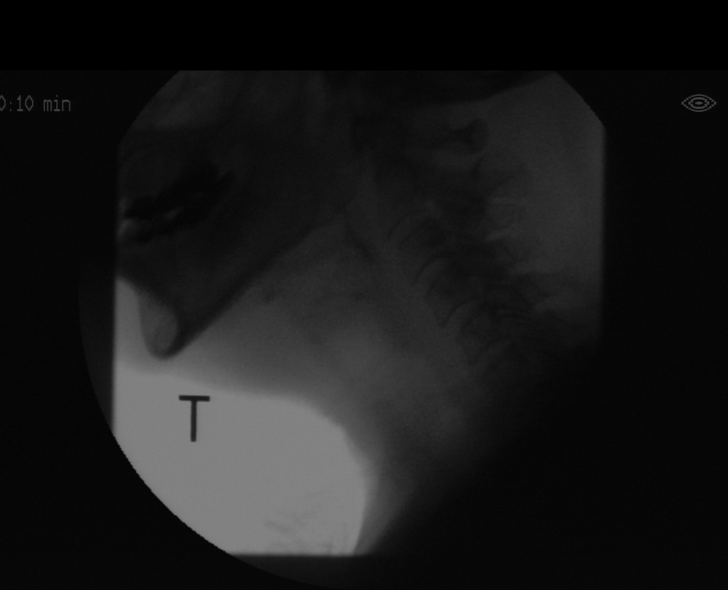

[Series 6: run · 1 of 111 frames shown (4 of 12)]
[frame 95/111]
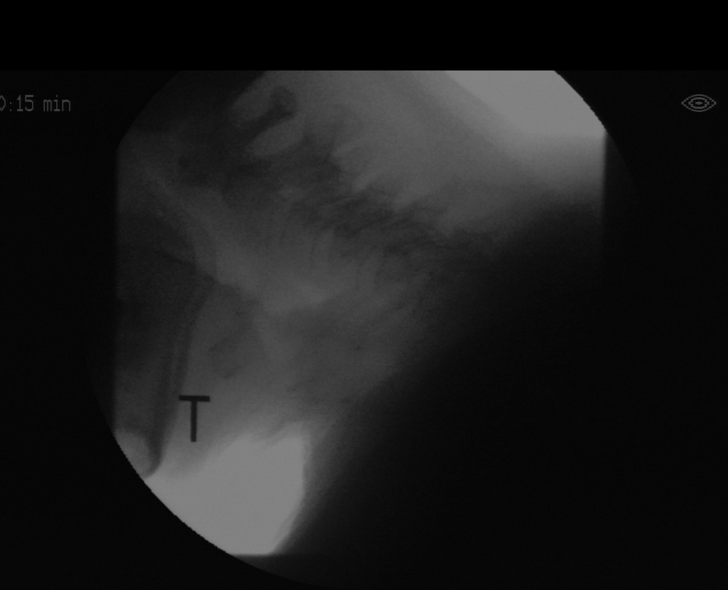

[Series 8: run · 1 of 52 frames shown (5 of 12)]
[frame 27/52]
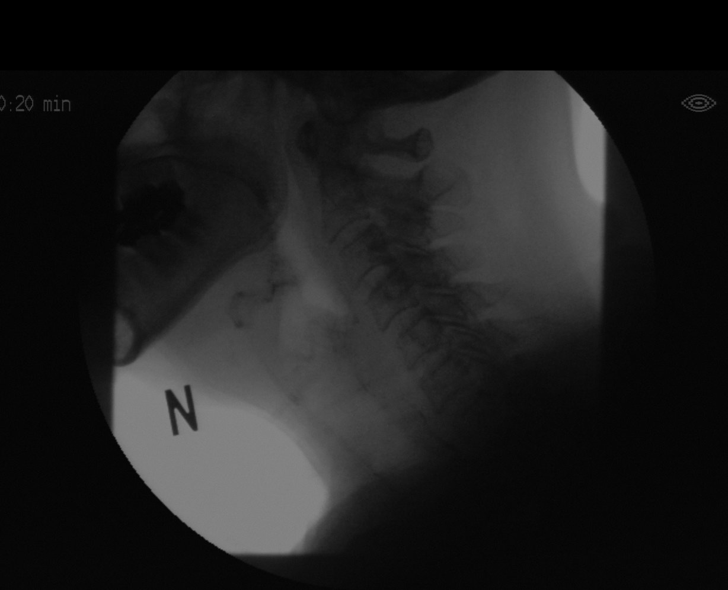

[Series 9: run · 1 of 111 frames shown (6 of 12)]
[frame 95/111]
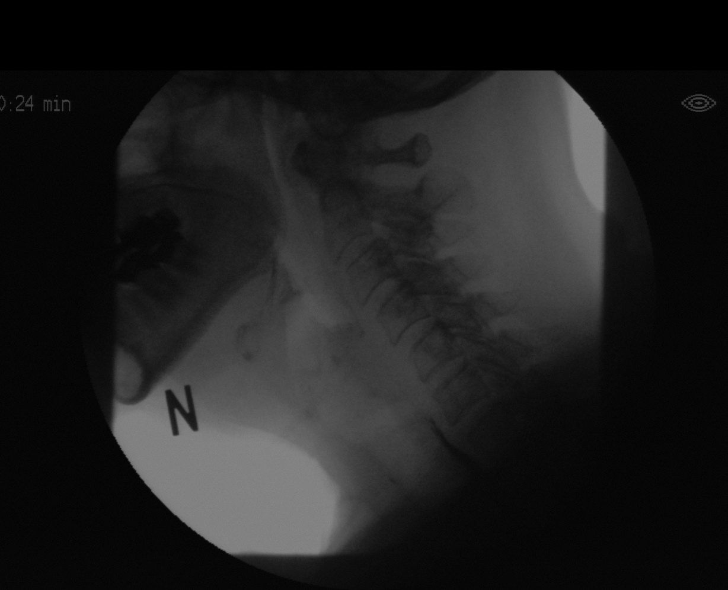

[Series 11: run · 1 of 21 frames shown (7 of 12)]
[frame 12/21]
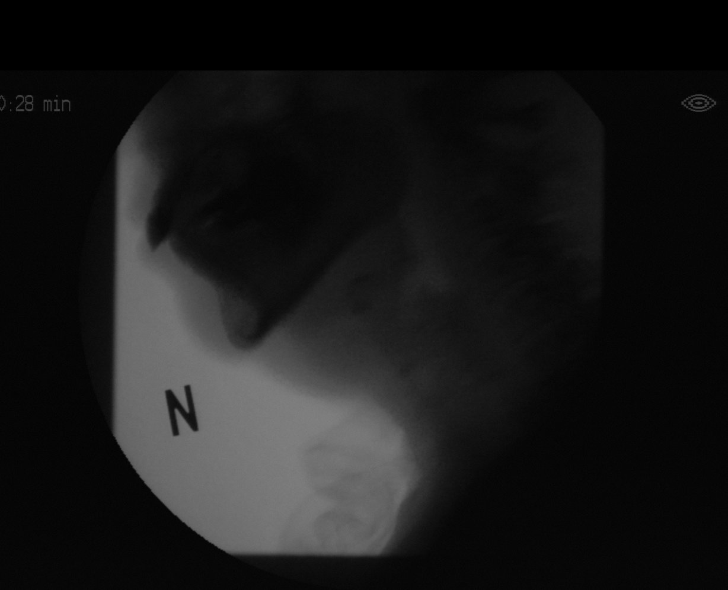

[Series 13: run · 1 of 100 frames shown (8 of 12)]
[frame 16/100]
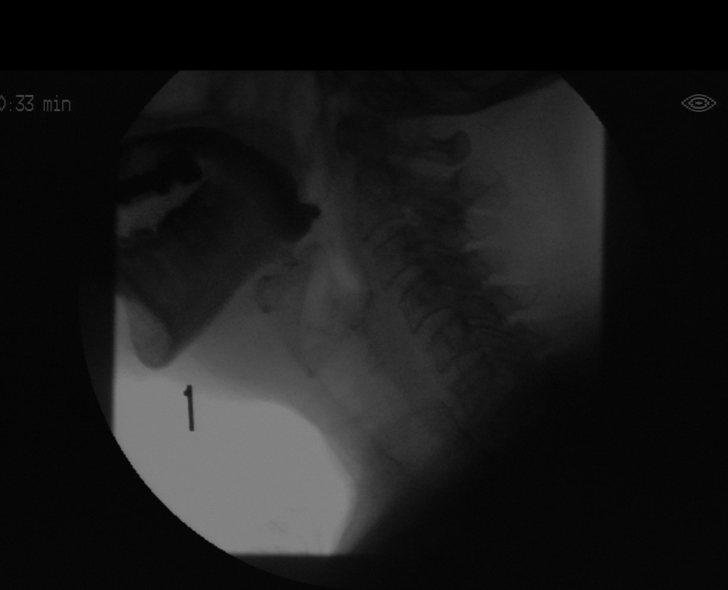

[Series 15: run · 1 of 216 frames shown (9 of 12)]
[frame 15/216]
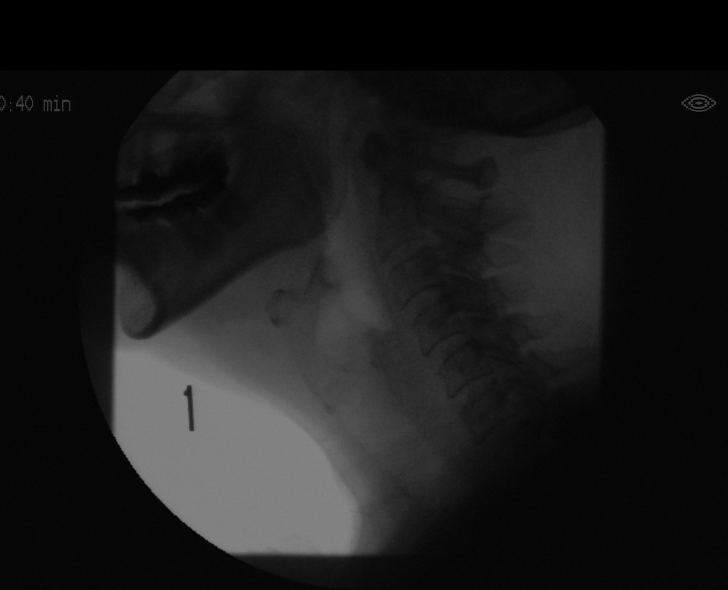

[Series 16: run · 1 of 92 frames shown (10 of 12)]
[frame 47/92]
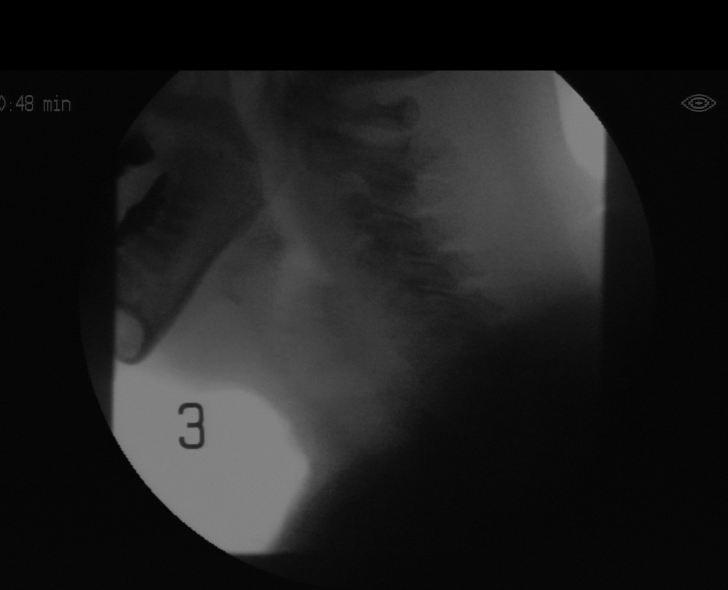

[Series 18: run · 1 of 102 frames shown (11 of 12)]
[frame 29/102]
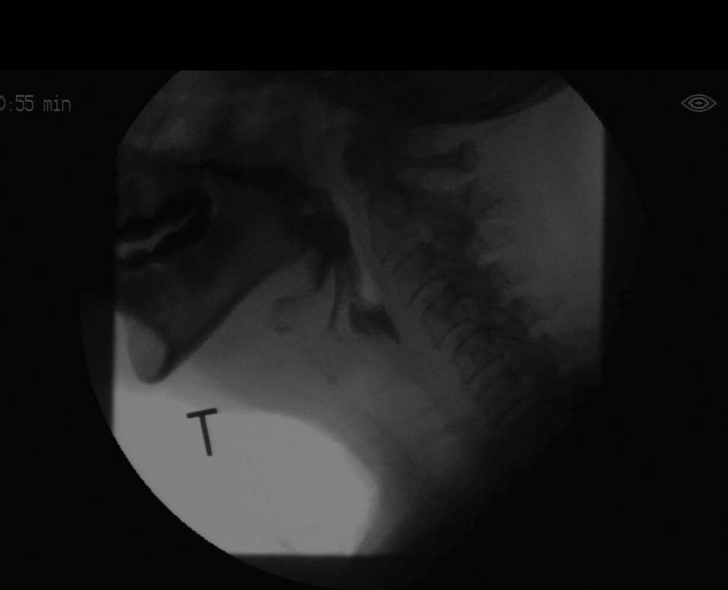

[Series 19: run · 1 of 83 frames shown (12 of 12)]
[frame 71/83]
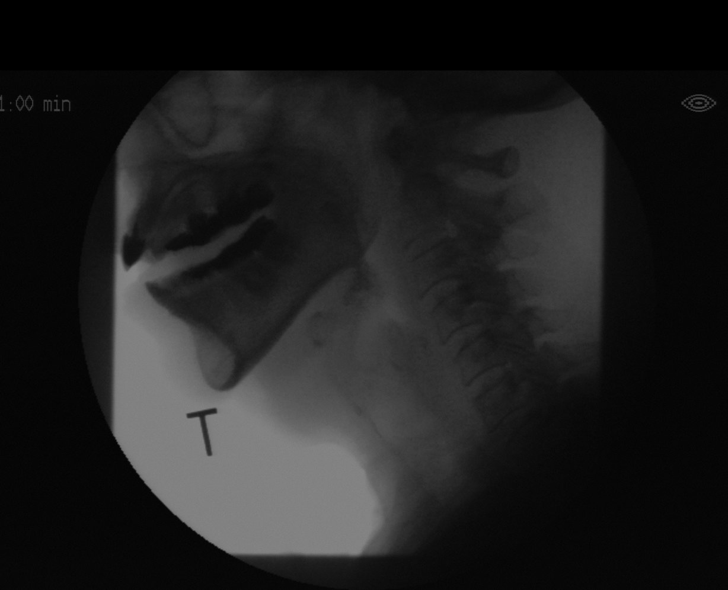

[12 of 24 positions shown; findings below may reference images not displayed]

FINDINGS/IMPRESSION:
Please refer to the Speech Pathologists report for complete details
and recommendations.

## 2021-04-09 NOTE — Therapy (Signed)
Modified Barium Swallow Progress Note  Patient Details  Name: Tammy Hernandez MRN: [id number] Date of Birth: 1956-07-15  Today's Date: 04/09/2021  Modified Barium Swallow completed.  Full report located under Chart Review in the Imaging Section.  Brief recommendations include the following:  Clinical Impression Pt presents with functional oral stage swallow, mild-moderate pharyngeal dysphagia. CN exam appears unremarkable, however, she is slightly dysarthric. Pharyngeal swallow is characterized by delayed swallow reflex, with trigger at the pyriform sinus on thin and nectar thick liquids, and at the vallecular sinus on puree and solid textures. There is no post-swallow residue following any consistency. Pt exhibits intermittent penetration before and during the swallow to the vocal folds on thin and nectar thick liquids. Chin tuck position worsened airway compromise, resulting in aspiration. Pt did exhibit a reflexive cough response after frank aspiration seen with thin liquids using chin tuck, but she did not cough with penetration to the vocal folds. Cued throat clear appeared to be effective to remove penetrate. Esophageal sweep did reveal it to be clear, however, pt reports globus sensation and burning heartburn with meats and breads that were not assessed during this study. Esophageal work up may be beneficial, as pt's use of TUMS may be suboptimal for effective management of esophageal issues.  Pt has not reported respiratory change or pulmonary congestion. Given similar presentation between thin and nectar thick liquids, will continue thin liquids with regular consistency solids. Pt was encouraged to clear her throat occasionally during meals, and to follow strategies for esophageal dysmotility. Pt may also benefit from outpatient speech therapy for treatment of dysphagia, especially in light of ongoing neurological work up.  Behavioral and Dietary Strategies for Management of Esophageal  Dysmotility 1. Take reflux medications 30+ minutes before other po intake, in the morning 2. Begin meals with warm beverage 3. Eat smaller more frequent meals 4. Eat slowly, taking small bites and sips 5. Alternate solids and liquids 6. Avoid foods/liquids that increase acid production 7. Sit upright during and for 30+ minutes after meals to facilitate esophageal clearing 8. Clear throat occasionally during meals    Swallow Evaluation Recommendations   Recommended Consults: Consider esophageal assessment   SLP Diet Recommendations: Regular solids;Thin liquid   Liquid Administration via: Cup;Straw   Medication Administration: Whole meds with liquid   Supervision: Patient able to self feed;Intermittent supervision to cue for compensatory strategies   Compensations: Slow rate;Minimize environmental distractions;Small sips/bites;Clear throat intermittently   Postural Changes: Seated upright at 90 degrees;Remain semi-upright after after feeds/meals (Comment)   Oral Care Recommendations: Oral care BID     Shirlyn Savin B. Quentin Ore, [facility] [place], [place] Speech Language Pathologist Office: [phone]  [place] 04/09/2021,2:49 PM

## 2021-04-11 ENCOUNTER — Other Ambulatory Visit: Payer: Self-pay | Admitting: Neurology

## 2021-04-11 DIAGNOSIS — R1310 Dysphagia, unspecified: Secondary | ICD-10-CM

## 2021-04-13 ENCOUNTER — Telehealth: Payer: Self-pay | Admitting: Neurology

## 2021-04-13 ENCOUNTER — Other Ambulatory Visit: Payer: Self-pay | Admitting: Neurology

## 2021-04-13 DIAGNOSIS — R2689 Other abnormalities of gait and mobility: Secondary | ICD-10-CM | POA: Diagnosis not present

## 2021-04-13 DIAGNOSIS — R1310 Dysphagia, unspecified: Secondary | ICD-10-CM

## 2021-04-13 DIAGNOSIS — M6281 Muscle weakness (generalized): Secondary | ICD-10-CM | POA: Diagnosis not present

## 2021-04-13 NOTE — Telephone Encounter (Signed)
Would you let patient know that, based on speech and swallow study, it has been recommend she has an esophageal assessment so I have referred her to Ear Nose and Throat. ENT will be calling her. Thank you

## 2021-04-14 NOTE — Telephone Encounter (Signed)
Spoke with pt and relayed info, advised her that ENT would be reaching out to her. She expressed understanding and appreciation.

## 2021-04-15 ENCOUNTER — Telehealth: Payer: Self-pay | Admitting: Neurology

## 2021-04-15 ENCOUNTER — Other Ambulatory Visit: Payer: Self-pay

## 2021-04-15 ENCOUNTER — Ambulatory Visit
Admission: RE | Admit: 2021-04-15 | Discharge: 2021-04-15 | Disposition: A | Discharge status: Home / Self Care | Payer: 59 | Source: Ambulatory Visit | Attending: Neurology | Admitting: Neurology

## 2021-04-15 ENCOUNTER — Ambulatory Visit
Admission: RE | Admit: 2021-04-15 | Discharge: 2021-04-15 | Disposition: A | Discharge status: Home / Self Care | Payer: Medicare HMO | Source: Ambulatory Visit | Attending: Neurology | Admitting: Neurology

## 2021-04-15 DIAGNOSIS — M47812 Spondylosis without myelopathy or radiculopathy, cervical region: Secondary | ICD-10-CM | POA: Diagnosis not present

## 2021-04-15 DIAGNOSIS — R270 Ataxia, unspecified: Secondary | ICD-10-CM | POA: Diagnosis not present

## 2021-04-15 DIAGNOSIS — R1310 Dysphagia, unspecified: Secondary | ICD-10-CM

## 2021-04-15 DIAGNOSIS — R200 Anesthesia of skin: Secondary | ICD-10-CM

## 2021-04-15 DIAGNOSIS — R292 Abnormal reflex: Secondary | ICD-10-CM

## 2021-04-15 DIAGNOSIS — R269 Unspecified abnormalities of gait and mobility: Secondary | ICD-10-CM

## 2021-04-15 DIAGNOSIS — W19XXXA Unspecified fall, initial encounter: Secondary | ICD-10-CM

## 2021-04-15 DIAGNOSIS — R2990 Unspecified symptoms and signs involving the nervous system: Secondary | ICD-10-CM

## 2021-04-15 DIAGNOSIS — M50222 Other cervical disc displacement at C5-C6 level: Secondary | ICD-10-CM | POA: Diagnosis not present

## 2021-04-15 DIAGNOSIS — M50221 Other cervical disc displacement at C4-C5 level: Secondary | ICD-10-CM | POA: Diagnosis not present

## 2021-04-15 DIAGNOSIS — G4719 Other hypersomnia: Secondary | ICD-10-CM

## 2021-04-15 DIAGNOSIS — M5021 Other cervical disc displacement,  high cervical region: Secondary | ICD-10-CM | POA: Diagnosis not present

## 2021-04-15 IMAGING — MR MR HEAD WO/W CM
14 series · 48 of 48 positions shown · IV contrast (multihance)
Comparison: Sinus CT [DATE]

CLINICAL DATA: Ataxia

EXAM:
MRI HEAD WITHOUT AND WITH CONTRAST
TECHNIQUE: Multiplanar, multiecho pulse sequences of the brain and surrounding
structures were obtained without and with intravenous contrast.
CONTRAST:  14mL MULTIHANCE GADOBENATE DIMEGLUMINE 529 MG/ML IV SOLN

[Series 2: T1 · sagittal · 5.0mm · 0.45mm/px · 1 of 25 slices shown]
[im 1/25]
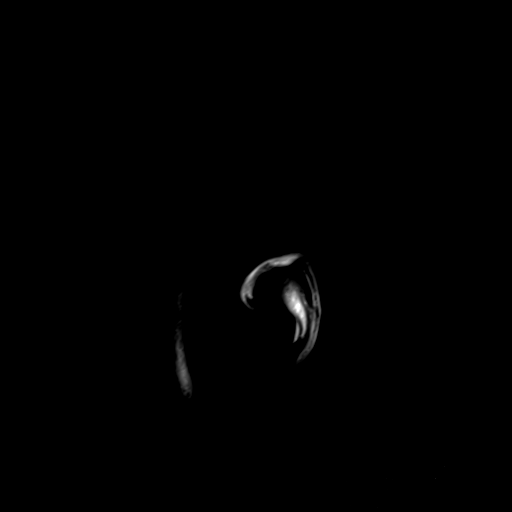

[Series 3: ax ep2d_diff_3 · axial · 3.0mm · 1.80mm/px · z∈[-35,+126]mm · 5 of 108 slices shown]
[im 1/108]
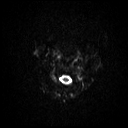
[im 27/108]
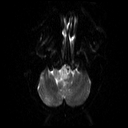
[im 54/108]
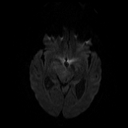
[im 81/108]
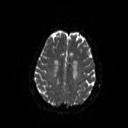
[im 108/108]
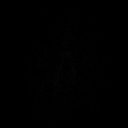

[Series 4: ax ep2d_diff_3_adc · axial · 3.0mm · 1.80mm/px · z∈[-35,+126]mm · 3 of 54 slices shown]
[im 1/54]
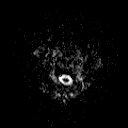
[im 27/54]
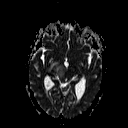
[im 54/54]
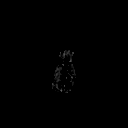

[Series 5: cor ep2d_diff · coronal · 5.0mm · 1.77mm/px · 3 of 60 slices shown]
[im 1/60]
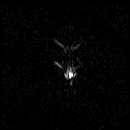
[im 30/60]
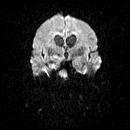
[im 60/60]
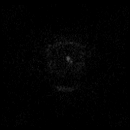

[Series 6: cor ep2d_diff_adc · coronal · 5.0mm · 1.77mm/px · 2 of 30 slices shown]
[im 1/30]
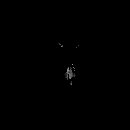
[im 30/30]
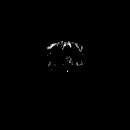

[Series 7: mip_images(sw) · axial · 16.0mm · 0.98mm/px · z∈[-27,+117]mm · 4 of 73 slices shown]
[im 1/73]
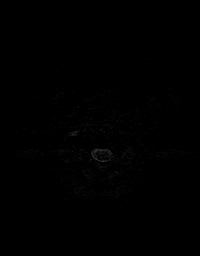
[im 25/73]
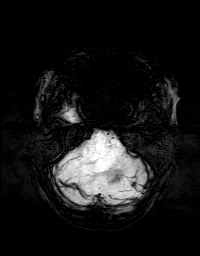
[im 49/73]
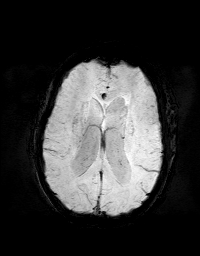
[im 73/73]
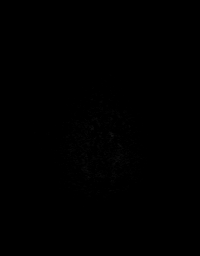

[Series 8: swi_images · axial · 2.0mm · 0.98mm/px · z∈[-34,+124]mm · 4 of 80 slices shown]
[im 1/80]
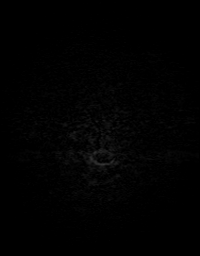
[im 27/80]
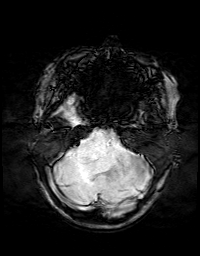
[im 53/80]
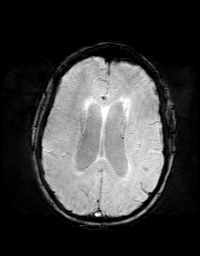
[im 80/80]
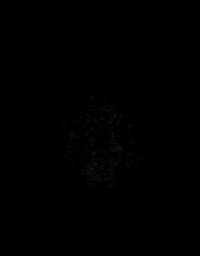

[Series 9: FLAIR · axial · 3.0mm · 0.43mm/px · z∈[-30,+122]mm · 2 of 40 slices shown]
[im 1/40]
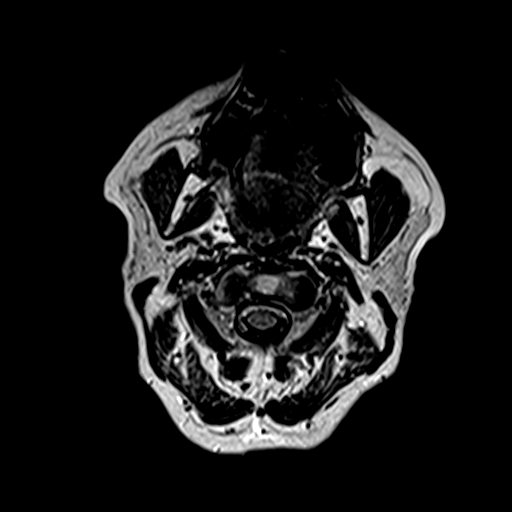
[im 40/40]
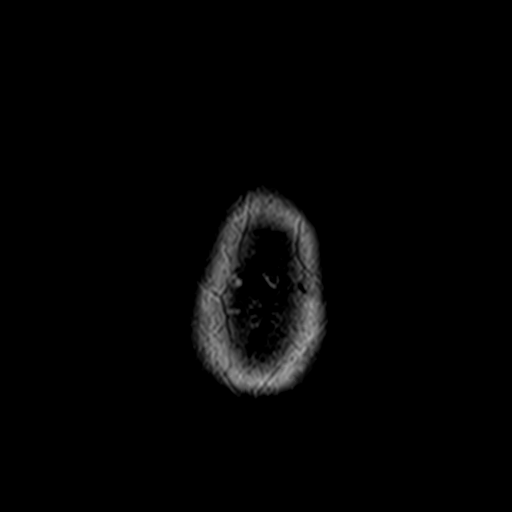

[Series 10: T2 · axial · 5.0mm · 0.65mm/px · z∈[-39,+129]mm · 2 of 29 slices shown (1 of 2)]
[im 1/29]
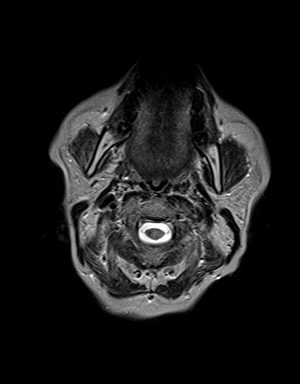
[im 29/29]
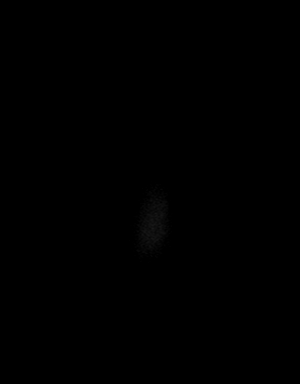

[Series 11: t1_mpr_tra · axial · 1.0mm · 0.72mm/px · z∈[-34,+125]mm · 9 of 160 slices shown]
[im 1/160]
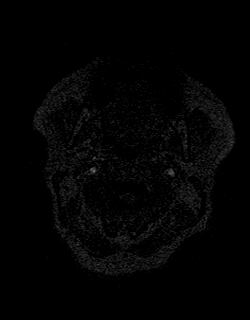
[im 20/160]
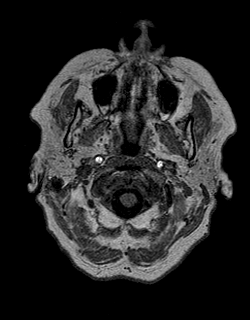
[im 40/160]
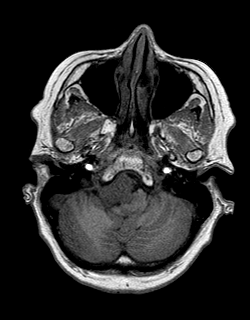
[im 60/160]
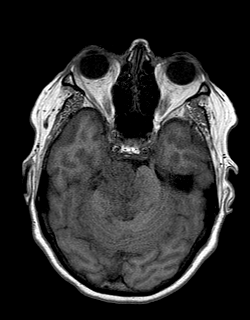
[im 80/160]
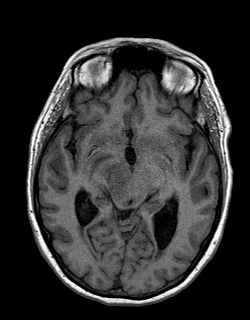
[im 100/160]
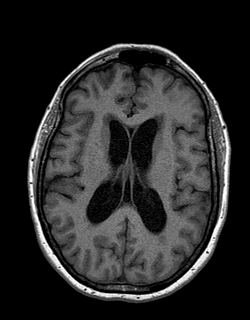
[im 120/160]
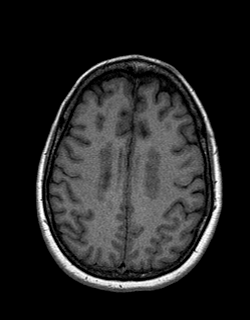
[im 140/160]
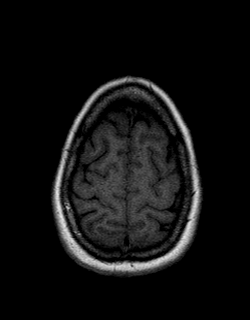
[im 160/160]
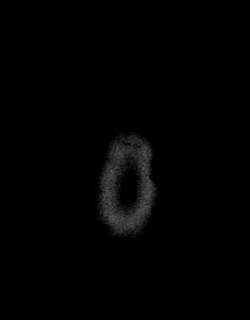

[Series 12: T2 · coronal · 5.0mm · 0.45mm/px · 2 of 31 slices shown (2 of 2)]
[im 1/31]
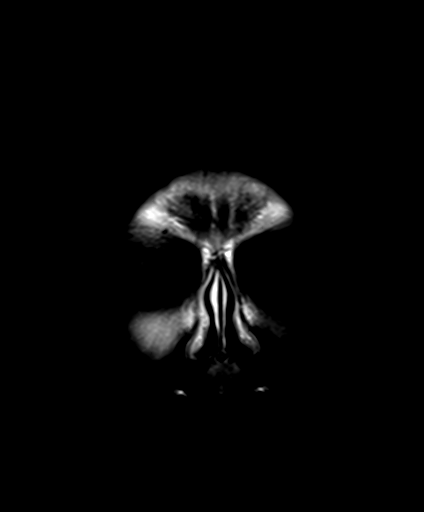
[im 31/31]
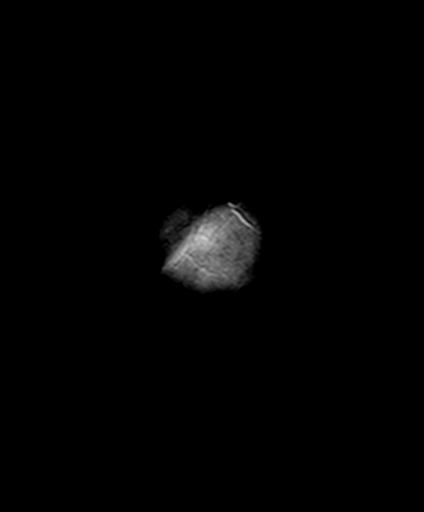

[Series 21: T1 post-contrast · coronal · 5.0mm · 0.72mm/px · 1 of 28 slices shown (1 of 2)]
[im 1/28]
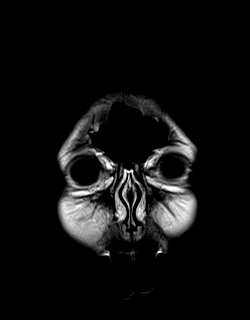

[Series 22: post t1_mpr_tra · axial · 1.0mm · 0.72mm/px · z∈[-34,+125]mm · 9 of 160 slices shown]
[im 1/160]
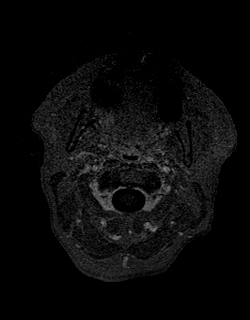
[im 20/160]
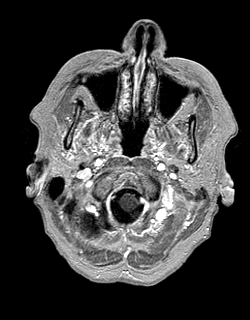
[im 40/160]
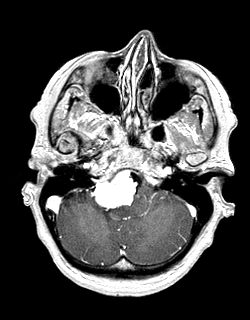
[im 60/160]
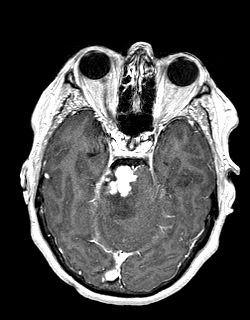
[im 80/160]
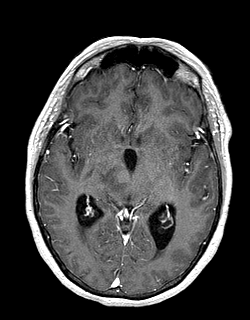
[im 100/160]
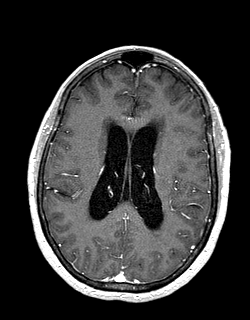
[im 120/160]
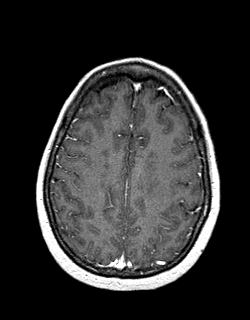
[im 140/160]
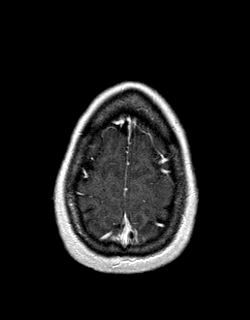
[im 160/160]
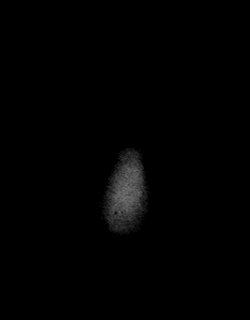

[Series 23: T1 post-contrast · sagittal · 5.0mm · 0.45mm/px · 1 of 25 slices shown (2 of 2)]
[im 1/25]
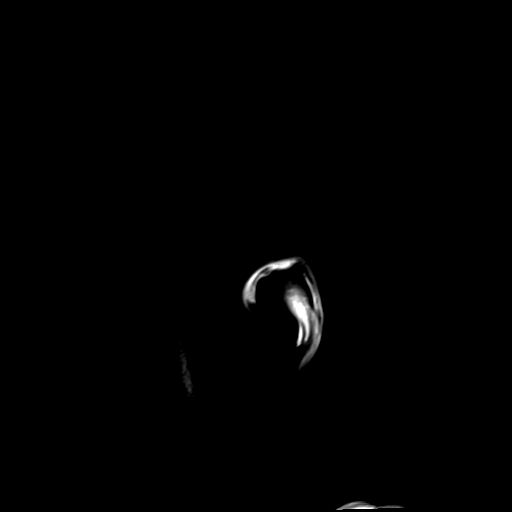

[48 of 48 positions shown; findings below may reference images not displayed]

FINDINGS: Brain: Intensely and homogeneously enhancing mass lesion of the CP
angle region on the right measuring 3.8 x 3.0 x 3.2 cm. No internal
necrosis. The lesion extends slightly into the internal auditory
canal put does not appear to have arisen within the IAC. The margins
are lobular in areas. No evidence of dural tail sign. Mass effect
upon the medulla and pons displacing those structures towards the
left. Considerable edema within the brainstem, extending through the
mid brain and cerebral peduncle into the right thalamus.

In reviewing the sinus CT from [CM], one can barely appreciate that
there was a mass in this region at that time, axial measurement of
3.3 x 2.5 cm, indicating that this is very slowly growing.

There is mass-effect upon the brainstem such that there is
hydrocephalus of the lateral and third ventricles with early
transependymal resorption.

No evidence of ischemic insult to the brain. No extra-axial fluid
collection.

Vascular: Major vessels at the base of the brain show flow.

Skull and upper cervical spine: Negative

Sinuses/Orbits: Clear/normal

Other: None
IMPRESSION: 3.8 x 3.0 x 3.2 cm mass in the right CP angle with mass effect upon
the medulla and pons. This is likely to represent an extra
canalicular vestibular schwannoma. Many vestibular schwannomas this
large will show some internal necrosis or cystic change but that is
not present in this case. Additionally, there is only minor
involvement of the internal auditory canal. Meningioma is considered
as an alternative diagnosis but there is no dural tail sign and the
lobular margins and internal characteristics are more typical of
vestibular schwannoma. Reviewing the sinus CT of [CM], one can see
subtle evidence of the lesion at that time, axial measurement 3.3 x
2.5 cm, indicating that this is a slowly growing mass.

Mass-effect upon the brainstem results in extensive vasogenic edema
as described above. There is obstructive hydrocephalus of the
lateral and third ventricles with early transependymal resorption of
CSF.

Call report in progress.

## 2021-04-15 IMAGING — MR MR CERVICAL SPINE W/O CM
6 of 7 series · 34 of 48 positions shown · non-contrast
Comparison: Brain MRI same day

CLINICAL DATA: Balance disturbance.

EXAM:
MRI CERVICAL SPINE WITHOUT CONTRAST
TECHNIQUE: Multiplanar, multisequence MR imaging of the cervical spine was
performed. No intravenous contrast was administered.

[Series 3: T2 · sagittal · 3.0mm · 0.66mm/px · 5 of 17 slices shown (1 of 3)]
[im 1/17]
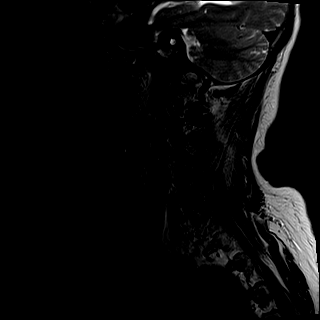
[im 5/17]
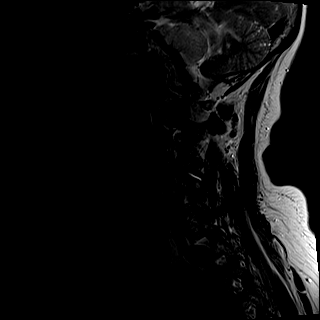
[im 9/17]
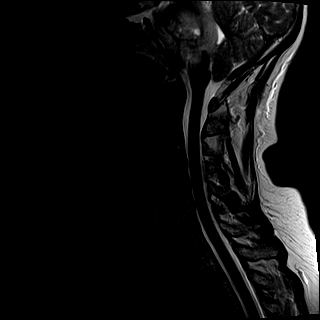
[im 13/17]
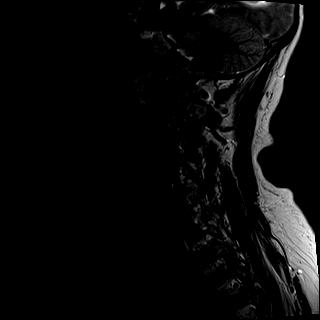
[im 17/17]
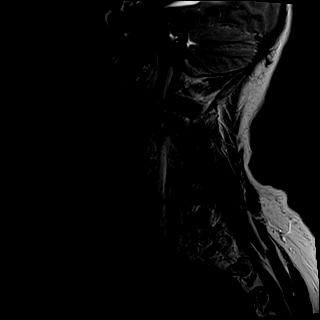

[Series 4: T1 · sagittal · 3.0mm · 0.41mm/px · 5 of 17 slices shown (1 of 2)]
[im 1/17]
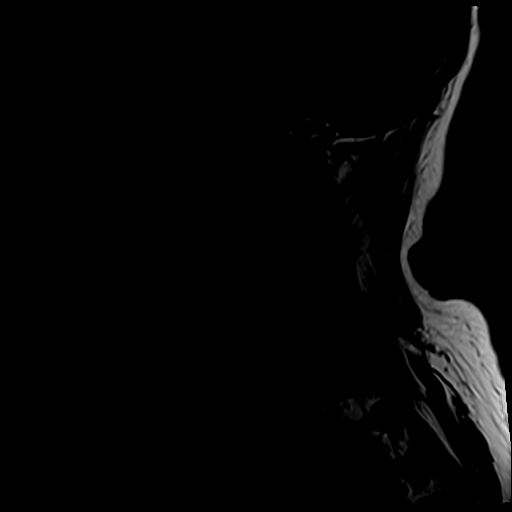
[im 5/17]
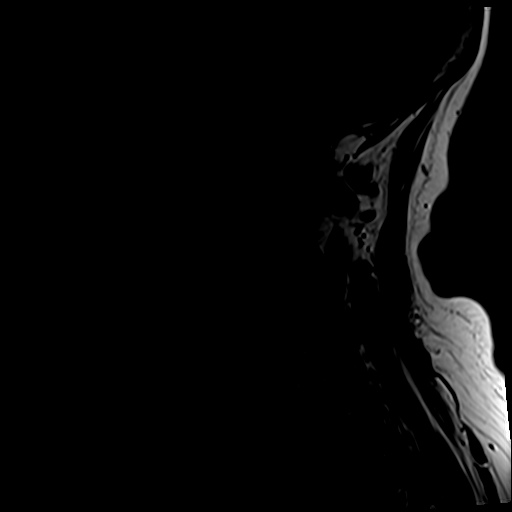
[im 9/17]
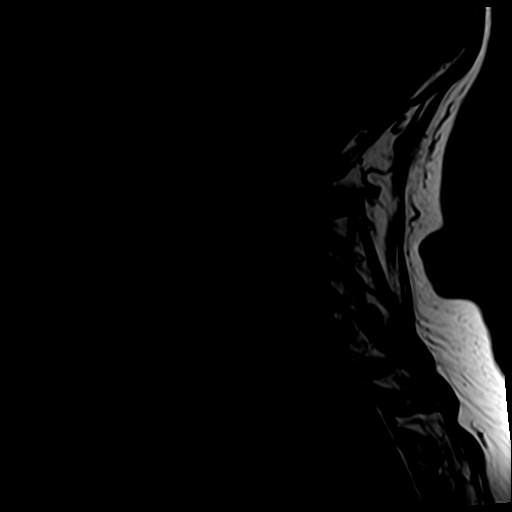
[im 13/17]
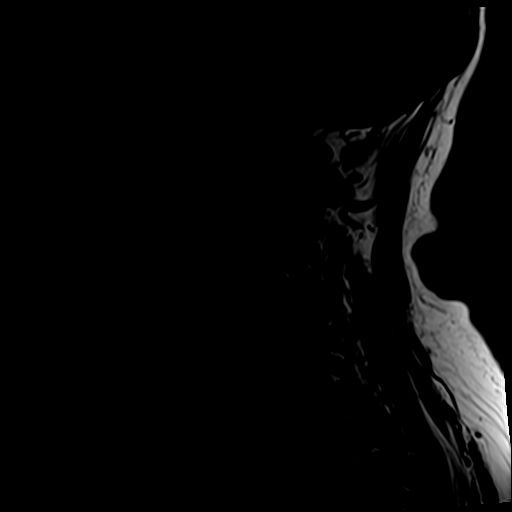
[im 17/17]
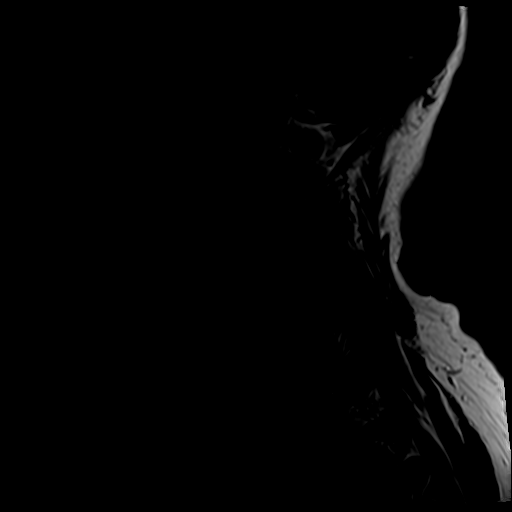

[Series 5: tir sag · sagittal · 3.0mm · 0.41mm/px · 1 of 17 slices shown]
[im 1/17]
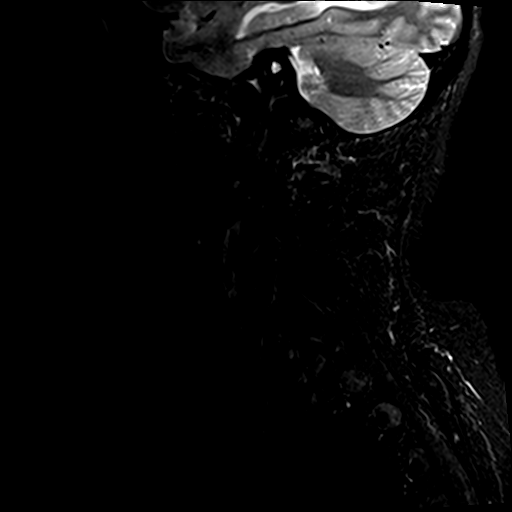

[Series 7: T2 · axial · 3.0mm · 0.70mm/px · z∈[-145,-47]mm · 9 of 28 slices shown (2 of 3)]
[im 1/28]
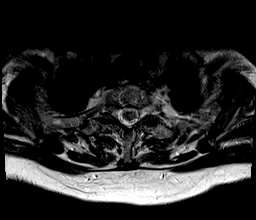
[im 4/28]
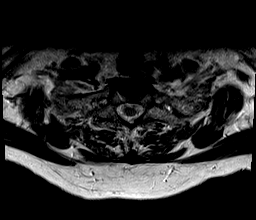
[im 7/28]
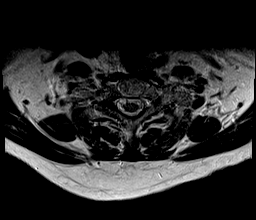
[im 11/28]
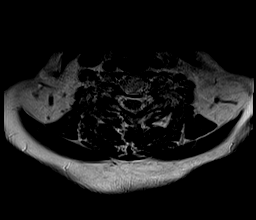
[im 14/28]
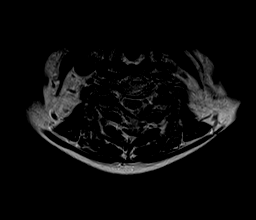
[im 17/28]
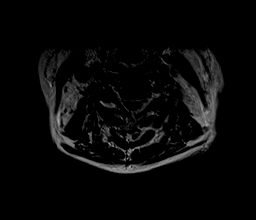
[im 21/28]
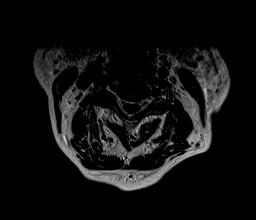
[im 24/28]
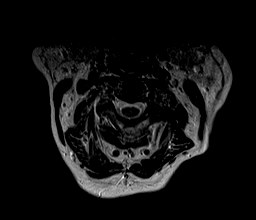
[im 28/28]
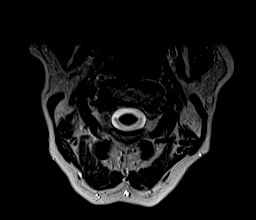

[Series 8: T1 · axial · 3.0mm · 0.35mm/px · z∈[-145,-47]mm · 8 of 28 slices shown (2 of 2)]
[im 1/28]
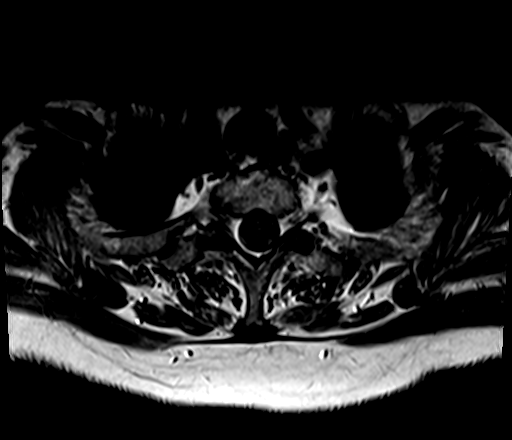
[im 4/28]
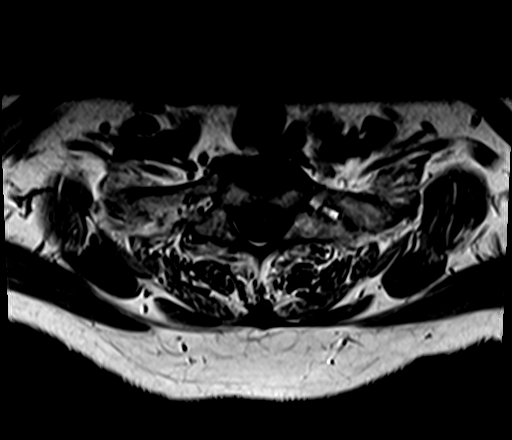
[im 7/28]
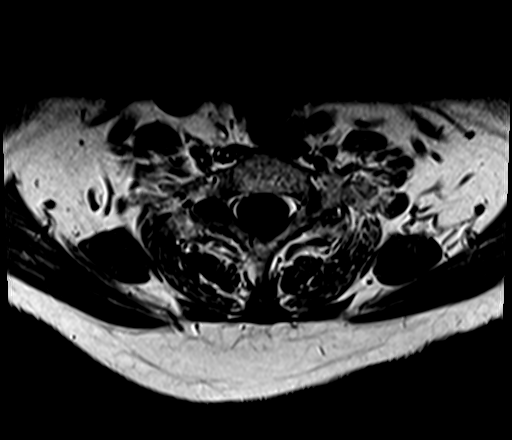
[im 11/28]
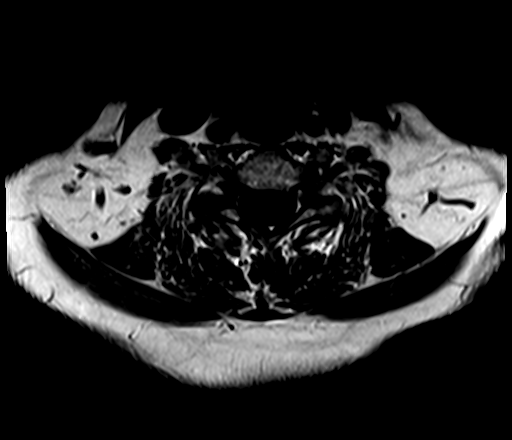
[im 17/28]
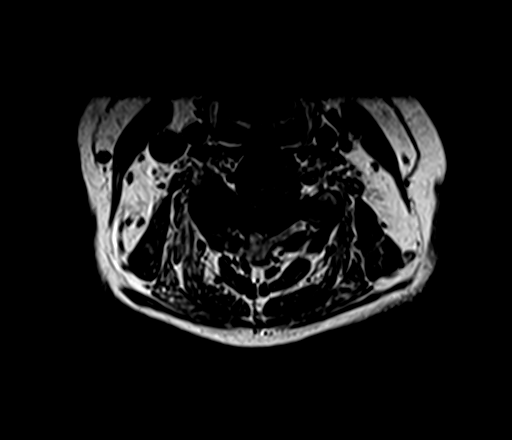
[im 21/28]
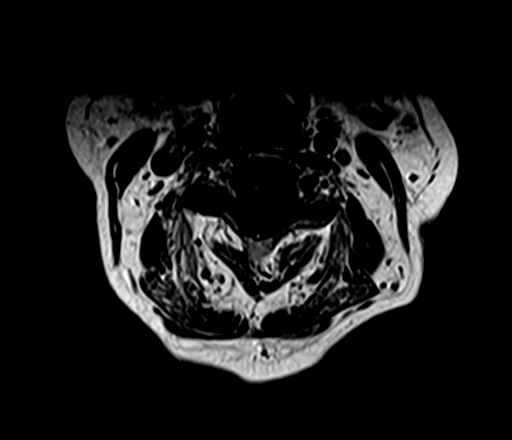
[im 24/28]
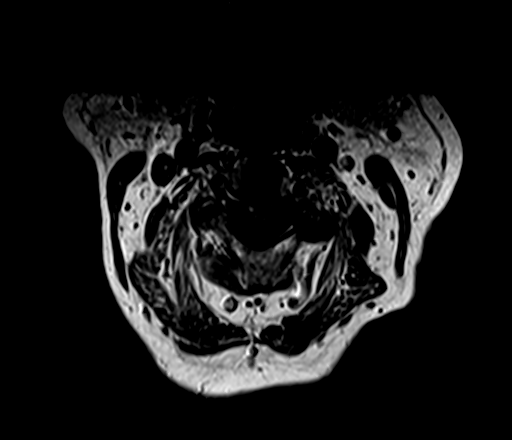
[im 28/28]
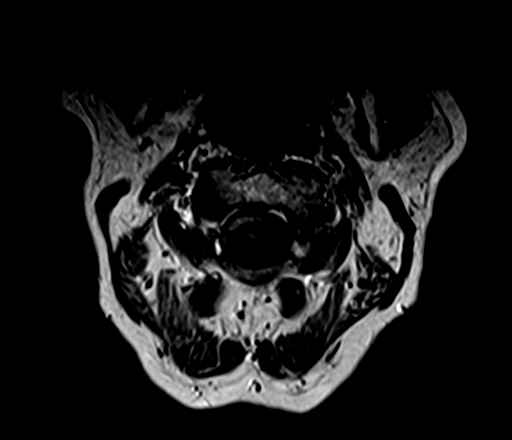

[Series 9: T2 · axial · 3.0mm · 0.70mm/px · z∈[-35,+30]mm · 6 of 19 slices shown (3 of 3)]
[im 1/19]
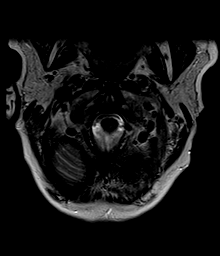
[im 4/19]
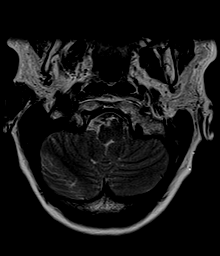
[im 8/19]
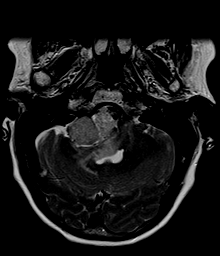
[im 11/19]
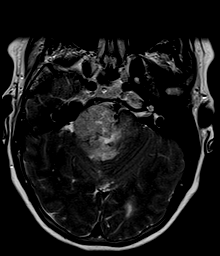
[im 15/19]
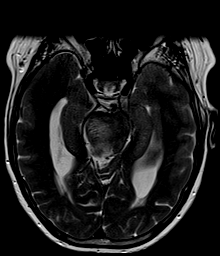
[im 19/19]
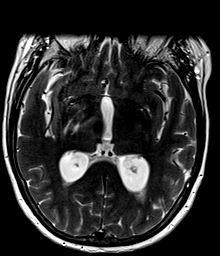

[34 of 48 positions shown; findings below may reference images not displayed]

FINDINGS: Alignment: Normal

Vertebrae: Normal

Cord: Normal

Posterior Fossa, vertebral arteries, paraspinal tissues: See results
of brain MRI. Large mass in the right CP angle with extrinsic
mass-effect upon the brainstem.

Disc levels:

Foramen magnum widely patent.  C1-2 is normal.

C2-3: No disc abnormality. Bilateral facet osteoarthritis. No canal
or foraminal stenosis.

C3-4: Mild bulging of the disc. Facet osteoarthritis right worse
than left. No compressive canal or foraminal narrowing.

C4-5: Mild bulging of the disc. Facet osteoarthritis on the right
with a joint effusion. Mild right foraminal narrowing.

C5-6: Bulging of the disc. Facet osteoarthritis on the right. No
compressive stenosis.

C6-7: Normal appearance of the disc. Mild facet osteoarthritis. No
stenosis.

C7-T1: Normal interspace.
IMPRESSION: See results of brain MRI. Large mass in the right CP angle with
mass-effect upon the brainstem.

No compressive canal or foraminal stenosis identified. Minor disc
bulges from C3-4 through C5-6. Facet osteoarthritis bilaterally at
C2-3 and on the right from C3-4 through C5-6. This could certainly
relate to neck pain, but there does not appear to be compressive
stenosis of the neural foramina.

## 2021-04-15 MED ORDER — GADOBENATE DIMEGLUMINE 529 MG/ML IV SOLN
14.0000 mL | Freq: Once | INTRAVENOUS | Status: AC | PRN
  Administered 2021-04-15: 14 mL via INTRAVENOUS

## 2021-04-16 ENCOUNTER — Other Ambulatory Visit: Payer: Self-pay | Admitting: Neurological Surgery

## 2021-04-16 DIAGNOSIS — R030 Elevated blood-pressure reading, without diagnosis of hypertension: Secondary | ICD-10-CM | POA: Diagnosis not present

## 2021-04-16 DIAGNOSIS — D496 Neoplasm of unspecified behavior of brain: Secondary | ICD-10-CM | POA: Diagnosis not present

## 2021-04-16 DIAGNOSIS — Z6830 Body mass index (BMI) 30.0-30.9, adult: Secondary | ICD-10-CM | POA: Diagnosis not present

## 2021-04-16 NOTE — Telephone Encounter (Signed)
Error

## 2021-04-17 NOTE — Pre-Procedure Instructions (Addendum)
KIRRAH MUSTIN  04/17/2021      [id number] - [address], [place] [address] Phone: [phone] Fax: [phone]    Your procedure is scheduled on May 17.  Report to [facility] Entrance A at 5:30 A.M.  Call this number if you have problems the morning of surgery:  [phone]   Remember:   Do not eat or drink after midnight.                    Take these medicines the morning of surgery with A SIP OF WATER :              Atorvastatin (lipitor)             Dicyclomine (bentyl)             flonase nasal spray             Levothyroxine (synthroid)             7 days prior to surgery STOP taking any Aspirin (unless otherwise instructed by your surgeon), Aleve, Naproxen, Ibuprofen, Motrin, Advil, Goody's, BC's, all herbal medications, fish oil, and all vitamins.                          How to Manage Your Diabetes Before and After Surgery  Why is it important to control my blood sugar before and after surgery? . Improving blood sugar levels before and after surgery helps healing and can limit problems. . A way of improving blood sugar control is eating a healthy diet by: o  Eating less sugar and carbohydrates o  Increasing activity/exercise o  Talking with your doctor about reaching your blood sugar goals . High blood sugars (greater than 180 mg/dL) can raise your risk of infections and slow your recovery, so you will need to focus on controlling your diabetes during the weeks before surgery. . Make sure that the doctor who takes care of your diabetes knows about your planned surgery including the date and location.  How do I manage my blood sugar before surgery? . Check your blood sugar at least 4 times a day, starting 2 days before surgery, to make sure that the level is not too high or low. o Check your blood sugar the morning of your surgery when you wake up and every 2 hours until you get  to the Short Stay unit. . If your blood sugar is less than 70 mg/dL, you will need to treat for low blood sugar: o Do not take insulin. o Treat a low blood sugar (less than 70 mg/dL) with  cup of clear juice (cranberry or apple), 4 glucose tablets, OR glucose gel. Recheck blood sugar in 15 minutes after treatment (to make sure it is greater than 70 mg/dL). If your blood sugar is not greater than 70 mg/dL on recheck, call [phone] o  for further instructions. . Report your blood sugar to the short stay nurse when you get to Short Stay.  . If you are admitted to the hospital after surgery: o Your blood sugar will be checked by the staff and you will probably be given insulin after surgery (instead of oral diabetes medicines) to make sure you have good blood sugar levels. o The goal for blood sugar control after surgery is 80-180 mg/dL.       WHAT DO I  DO ABOUT MY DIABETES MEDICATION?   Marland Kitchen Do not take oral diabetes medicines (pills) the morning of surgery. (metformin/glucophage)     Do not wear jewelry, make-up or nail polish.  Do not wear lotions, powders, or perfumes, or deodorant.  Do not shave 48 hours prior to surgery.  Men may shave face and neck.  Do not bring valuables to the hospital.  [facility] is not responsible for any belongings or valuables.  Contacts, dentures or bridgework may not be worn into surgery.  Leave your suitcase in the car.  After surgery it may be brought to your room.  For patients admitted to the hospital, discharge time will be determined by your treatment team.  Patients discharged the day of surgery will not be allowed to drive home.    Special instructions:   [facility]- Preparing For Surgery  Before surgery, you can play an important role. Because skin is not sterile, your skin needs to be as free of germs as possible. You can reduce the number of germs on your skin by washing with CHG (chlorahexidine gluconate) Soap before surgery.  CHG is  an antiseptic cleaner which kills germs and bonds with the skin to continue killing germs even after washing.    Oral Hygiene is also important to reduce your risk of infection.  Remember - BRUSH YOUR TEETH THE MORNING OF SURGERY WITH YOUR REGULAR TOOTHPASTE  Please do not use if you have an allergy to CHG or antibacterial soaps. If your skin becomes reddened/irritated stop using the CHG.  Do not shave (including legs and underarms) for at least 48 hours prior to first CHG shower. It is OK to shave your face.  Please follow these instructions carefully.   1. Shower the NIGHT BEFORE SURGERY and the MORNING OF SURGERY with CHG.   2. If you chose to wash your hair, wash your hair first as usual with your normal shampoo.  3. After you shampoo, rinse your hair and body thoroughly to remove the shampoo.  4. Use CHG as you would any other liquid soap. You can apply CHG directly to the skin and wash gently with a scrungie or a clean washcloth.   5. Apply the CHG Soap to your body ONLY FROM THE NECK DOWN.  Do not use on open wounds or open sores. Avoid contact with your eyes, ears, mouth and genitals (private parts). Wash Face and genitals (private parts)  with your normal soap.  6. Wash thoroughly, paying special attention to the area where your surgery will be performed.  7. Thoroughly rinse your body with warm water from the neck down.  8. DO NOT shower/wash with your normal soap after using and rinsing off the CHG Soap.  9. Pat yourself dry with a CLEAN TOWEL.  10. Wear CLEAN PAJAMAS to bed the night before surgery, wear comfortable clothes the morning of surgery  11. Place CLEAN SHEETS on your bed the night of your first shower and DO NOT SLEEP WITH PETS.    Day of Surgery:  Do not apply any deodorants/lotions.  Please wear clean clothes to the hospital/surgery center.   Remember to brush your teeth WITH YOUR REGULAR TOOTHPASTE.    Please read over the following fact sheets that  you were given.

## 2021-04-19 ENCOUNTER — Other Ambulatory Visit: Payer: Self-pay

## 2021-04-19 ENCOUNTER — Encounter (HOSPITAL_COMMUNITY): Payer: Self-pay

## 2021-04-19 ENCOUNTER — Encounter (HOSPITAL_COMMUNITY)
Admission: RE | Admit: 2021-04-19 | Discharge: 2021-04-19 | Disposition: A | Discharge status: Home / Self Care | Payer: 59 | Source: Ambulatory Visit | Attending: Neurological Surgery | Admitting: Neurological Surgery

## 2021-04-19 DIAGNOSIS — J95821 Acute postprocedural respiratory failure: Secondary | ICD-10-CM | POA: Diagnosis not present

## 2021-04-19 DIAGNOSIS — G936 Cerebral edema: Secondary | ICD-10-CM | POA: Diagnosis not present

## 2021-04-19 DIAGNOSIS — J189 Pneumonia, unspecified organism: Secondary | ICD-10-CM | POA: Diagnosis not present

## 2021-04-19 DIAGNOSIS — G9341 Metabolic encephalopathy: Secondary | ICD-10-CM | POA: Diagnosis not present

## 2021-04-19 DIAGNOSIS — Z01812 Encounter for preprocedural laboratory examination: Secondary | ICD-10-CM | POA: Insufficient documentation

## 2021-04-19 DIAGNOSIS — R6521 Severe sepsis with septic shock: Secondary | ICD-10-CM | POA: Diagnosis not present

## 2021-04-19 DIAGNOSIS — D329 Benign neoplasm of meninges, unspecified: Secondary | ICD-10-CM | POA: Diagnosis not present

## 2021-04-19 DIAGNOSIS — I613 Nontraumatic intracerebral hemorrhage in brain stem: Secondary | ICD-10-CM | POA: Diagnosis not present

## 2021-04-19 DIAGNOSIS — R578 Other shock: Secondary | ICD-10-CM | POA: Diagnosis not present

## 2021-04-19 DIAGNOSIS — G911 Obstructive hydrocephalus: Secondary | ICD-10-CM | POA: Diagnosis not present

## 2021-04-19 DIAGNOSIS — Z20822 Contact with and (suspected) exposure to covid-19: Secondary | ICD-10-CM | POA: Insufficient documentation

## 2021-04-19 DIAGNOSIS — A419 Sepsis, unspecified organism: Secondary | ICD-10-CM | POA: Diagnosis not present

## 2021-04-19 DIAGNOSIS — Z66 Do not resuscitate: Secondary | ICD-10-CM | POA: Diagnosis not present

## 2021-04-19 HISTORY — DX: Unspecified hearing loss, unspecified ear: H91.90

## 2021-04-19 LAB — CBC
HCT: 42.1 % (ref 36.0–46.0)
Hemoglobin: 13.8 g/dL (ref 12.0–15.0)
MCH: 30.1 pg (ref 26.0–34.0)
MCHC: 32.8 g/dL (ref 30.0–36.0)
MCV: 91.9 fL (ref 80.0–100.0)
Platelets: 293 10*3/uL (ref 150–400)
RBC: 4.58 MIL/uL (ref 3.87–5.11)
RDW: 12.5 % (ref 11.5–15.5)
WBC: 9.5 10*3/uL (ref 4.0–10.5)
nRBC: 0 % (ref 0.0–0.2)

## 2021-04-19 LAB — TYPE AND SCREEN
ABO/RH(D): O POS
Antibody Screen: NEGATIVE

## 2021-04-19 LAB — BASIC METABOLIC PANEL
Anion gap: 8 (ref 5–15)
BUN: 11 mg/dL (ref 8–23)
CO2: 25 mmol/L (ref 22–32)
Calcium: 9.8 mg/dL (ref 8.9–10.3)
Chloride: 107 mmol/L (ref 98–111)
Creatinine, Ser: 0.71 mg/dL (ref 0.44–1.00)
GFR, Estimated: >60 mL/min (ref >60)
Glucose, Bld: 101 mg/dL — ABNORMAL HIGH (ref 70–99)
Potassium: 3.6 mmol/L (ref 3.5–5.1)
Sodium: 140 mmol/L (ref 135–145)

## 2021-04-19 LAB — GLUCOSE, CAPILLARY: Glucose-Capillary: 85 mg/dL (ref 70–99)

## 2021-04-19 LAB — HEMOGLOBIN A1C
Hgb A1c MFr Bld: 5.8 % — ABNORMAL HIGH (ref 4.8–5.6)
Mean Plasma Glucose: 119.76 mg/dL

## 2021-04-19 MED ORDER — ONDANSETRON HCL 4 MG/2ML IJ SOLN
INTRAMUSCULAR | Status: AC
  Filled 2021-04-19: qty 2

## 2021-04-19 NOTE — Progress Notes (Signed)
PCP - [facility] -Dr Kennith Maes Cardiologist - n/a  Chest x-ray - n/a EKG - 04/19/21 Stress Test - n/a ECHO - n/a Cardiac Cath - n/a  Fasting Blood Sugar - 90s-100s Checks Blood Sugar once a week  . Do not take metformin on the morning of surgery.  . If your blood sugar is less than 70 mg/dL, you will need to treat for low blood sugar: o Treat a low blood sugar (less than 70 mg/dL) with  cup of clear juice (cranberry or apple), 4 glucose tablets, OR glucose gel. o Recheck blood sugar in 15 minutes after treatment (to make sure it is greater than 70 mg/dL). If your blood sugar is not greater than 70 mg/dL on recheck, call [phone] for further instructions.  STOP now taking any Aspirin (unless otherwise instructed by your surgeon), Aleve, Naproxen, Ibuprofen, Motrin, Advil, Goody's, BC's, all herbal medications, fish oil, and all vitamins.   Coronavirus Screening Covid test is scheduled on 04/19/21 Do you have any of the following symptoms:  Cough yes/no: No Fever (>100.35F)  yes/no: No Runny nose yes/no: No Sore throat yes/no: No Difficulty breathing/shortness of breath  yes/no: No  Have you traveled in the last 14 days and where? yes/no: No  Patient verbalized understanding of instructions that were given to them at the PAT appointment.

## 2021-04-20 ENCOUNTER — Inpatient Hospital Stay (HOSPITAL_COMMUNITY): Payer: 59 | Admitting: Anesthesiology

## 2021-04-20 ENCOUNTER — Inpatient Hospital Stay (HOSPITAL_COMMUNITY): Payer: 59

## 2021-04-20 ENCOUNTER — Encounter (HOSPITAL_COMMUNITY): Payer: Self-pay | Admitting: Neurological Surgery

## 2021-04-20 ENCOUNTER — Encounter (HOSPITAL_COMMUNITY): Admission: RE | Disposition: E | Payer: Self-pay | Source: Home / Self Care | Attending: Neurological Surgery

## 2021-04-20 ENCOUNTER — Inpatient Hospital Stay (HOSPITAL_COMMUNITY)
Admission: RE | Admit: 2021-04-20 | Discharge: 2021-06-04 | DRG: 025 | Disposition: E | Payer: 59 | Attending: Neurological Surgery | Admitting: Neurological Surgery

## 2021-04-20 DIAGNOSIS — R6520 Severe sepsis without septic shock: Secondary | ICD-10-CM | POA: Diagnosis not present

## 2021-04-20 DIAGNOSIS — Z66 Do not resuscitate: Secondary | ICD-10-CM | POA: Diagnosis present

## 2021-04-20 DIAGNOSIS — G919 Hydrocephalus, unspecified: Secondary | ICD-10-CM | POA: Diagnosis not present

## 2021-04-20 DIAGNOSIS — A419 Sepsis, unspecified organism: Secondary | ICD-10-CM | POA: Diagnosis not present

## 2021-04-20 DIAGNOSIS — D320 Benign neoplasm of cerebral meninges: Secondary | ICD-10-CM | POA: Diagnosis not present

## 2021-04-20 DIAGNOSIS — E559 Vitamin D deficiency, unspecified: Secondary | ICD-10-CM | POA: Diagnosis not present

## 2021-04-20 DIAGNOSIS — G936 Cerebral edema: Secondary | ICD-10-CM | POA: Diagnosis not present

## 2021-04-20 DIAGNOSIS — E0590 Thyrotoxicosis, unspecified without thyrotoxic crisis or storm: Secondary | ICD-10-CM | POA: Diagnosis present

## 2021-04-20 DIAGNOSIS — Z789 Other specified health status: Secondary | ICD-10-CM | POA: Diagnosis not present

## 2021-04-20 DIAGNOSIS — Z005 Encounter for examination of potential donor of organ and tissue: Secondary | ICD-10-CM

## 2021-04-20 DIAGNOSIS — E876 Hypokalemia: Secondary | ICD-10-CM | POA: Diagnosis not present

## 2021-04-20 DIAGNOSIS — K567 Ileus, unspecified: Secondary | ICD-10-CM | POA: Diagnosis not present

## 2021-04-20 DIAGNOSIS — D496 Neoplasm of unspecified behavior of brain: Secondary | ICD-10-CM | POA: Diagnosis not present

## 2021-04-20 DIAGNOSIS — Z20822 Contact with and (suspected) exposure to covid-19: Secondary | ICD-10-CM | POA: Diagnosis present

## 2021-04-20 DIAGNOSIS — M810 Age-related osteoporosis without current pathological fracture: Secondary | ICD-10-CM | POA: Diagnosis present

## 2021-04-20 DIAGNOSIS — J9601 Acute respiratory failure with hypoxia: Secondary | ICD-10-CM | POA: Diagnosis not present

## 2021-04-20 DIAGNOSIS — J9602 Acute respiratory failure with hypercapnia: Secondary | ICD-10-CM | POA: Diagnosis not present

## 2021-04-20 DIAGNOSIS — E873 Alkalosis: Secondary | ICD-10-CM | POA: Diagnosis not present

## 2021-04-20 DIAGNOSIS — Z419 Encounter for procedure for purposes other than remedying health state, unspecified: Secondary | ICD-10-CM

## 2021-04-20 DIAGNOSIS — I700 Atherosclerosis of aorta: Secondary | ICD-10-CM | POA: Diagnosis not present

## 2021-04-20 DIAGNOSIS — Z9889 Other specified postprocedural states: Secondary | ICD-10-CM

## 2021-04-20 DIAGNOSIS — T85598A Other mechanical complication of other gastrointestinal prosthetic devices, implants and grafts, initial encounter: Secondary | ICD-10-CM

## 2021-04-20 DIAGNOSIS — R069 Unspecified abnormalities of breathing: Secondary | ICD-10-CM

## 2021-04-20 DIAGNOSIS — R6521 Severe sepsis with septic shock: Secondary | ICD-10-CM | POA: Diagnosis not present

## 2021-04-20 DIAGNOSIS — R7401 Elevation of levels of liver transaminase levels: Secondary | ICD-10-CM | POA: Diagnosis not present

## 2021-04-20 DIAGNOSIS — Z982 Presence of cerebrospinal fluid drainage device: Secondary | ICD-10-CM | POA: Diagnosis not present

## 2021-04-20 DIAGNOSIS — E1165 Type 2 diabetes mellitus with hyperglycemia: Secondary | ICD-10-CM | POA: Diagnosis present

## 2021-04-20 DIAGNOSIS — Z4659 Encounter for fitting and adjustment of other gastrointestinal appliance and device: Secondary | ICD-10-CM

## 2021-04-20 DIAGNOSIS — I613 Nontraumatic intracerebral hemorrhage in brain stem: Secondary | ICD-10-CM | POA: Diagnosis not present

## 2021-04-20 DIAGNOSIS — J9690 Respiratory failure, unspecified, unspecified whether with hypoxia or hypercapnia: Secondary | ICD-10-CM | POA: Diagnosis not present

## 2021-04-20 DIAGNOSIS — D329 Benign neoplasm of meninges, unspecified: Principal | ICD-10-CM | POA: Diagnosis present

## 2021-04-20 DIAGNOSIS — E872 Acidosis: Secondary | ICD-10-CM | POA: Diagnosis not present

## 2021-04-20 DIAGNOSIS — G9341 Metabolic encephalopathy: Secondary | ICD-10-CM | POA: Diagnosis not present

## 2021-04-20 DIAGNOSIS — E785 Hyperlipidemia, unspecified: Secondary | ICD-10-CM | POA: Diagnosis not present

## 2021-04-20 DIAGNOSIS — R509 Fever, unspecified: Secondary | ICD-10-CM

## 2021-04-20 DIAGNOSIS — I616 Nontraumatic intracerebral hemorrhage, multiple localized: Secondary | ICD-10-CM | POA: Diagnosis not present

## 2021-04-20 DIAGNOSIS — R4182 Altered mental status, unspecified: Secondary | ICD-10-CM | POA: Diagnosis not present

## 2021-04-20 DIAGNOSIS — N179 Acute kidney failure, unspecified: Secondary | ICD-10-CM | POA: Diagnosis not present

## 2021-04-20 DIAGNOSIS — Z4682 Encounter for fitting and adjustment of non-vascular catheter: Secondary | ICD-10-CM | POA: Diagnosis not present

## 2021-04-20 DIAGNOSIS — J95821 Acute postprocedural respiratory failure: Secondary | ICD-10-CM | POA: Diagnosis not present

## 2021-04-20 DIAGNOSIS — Z452 Encounter for adjustment and management of vascular access device: Secondary | ICD-10-CM

## 2021-04-20 DIAGNOSIS — E7220 Disorder of urea cycle metabolism, unspecified: Secondary | ICD-10-CM | POA: Diagnosis present

## 2021-04-20 DIAGNOSIS — J9811 Atelectasis: Secondary | ICD-10-CM | POA: Diagnosis not present

## 2021-04-20 DIAGNOSIS — Z87891 Personal history of nicotine dependence: Secondary | ICD-10-CM

## 2021-04-20 DIAGNOSIS — J189 Pneumonia, unspecified organism: Secondary | ICD-10-CM | POA: Diagnosis not present

## 2021-04-20 DIAGNOSIS — J9600 Acute respiratory failure, unspecified whether with hypoxia or hypercapnia: Secondary | ICD-10-CM | POA: Diagnosis not present

## 2021-04-20 DIAGNOSIS — R600 Localized edema: Secondary | ICD-10-CM | POA: Diagnosis not present

## 2021-04-20 DIAGNOSIS — Z529 Donor of unspecified organ or tissue: Secondary | ICD-10-CM | POA: Diagnosis not present

## 2021-04-20 DIAGNOSIS — E871 Hypo-osmolality and hyponatremia: Secondary | ICD-10-CM | POA: Diagnosis not present

## 2021-04-20 DIAGNOSIS — G911 Obstructive hydrocephalus: Secondary | ICD-10-CM | POA: Diagnosis present

## 2021-04-20 DIAGNOSIS — K921 Melena: Secondary | ICD-10-CM | POA: Diagnosis not present

## 2021-04-20 DIAGNOSIS — I619 Nontraumatic intracerebral hemorrhage, unspecified: Secondary | ICD-10-CM | POA: Diagnosis not present

## 2021-04-20 DIAGNOSIS — J3489 Other specified disorders of nose and nasal sinuses: Secondary | ICD-10-CM | POA: Diagnosis not present

## 2021-04-20 DIAGNOSIS — I2510 Atherosclerotic heart disease of native coronary artery without angina pectoris: Secondary | ICD-10-CM | POA: Diagnosis not present

## 2021-04-20 DIAGNOSIS — G959 Disease of spinal cord, unspecified: Secondary | ICD-10-CM | POA: Diagnosis present

## 2021-04-20 DIAGNOSIS — Z7189 Other specified counseling: Secondary | ICD-10-CM | POA: Diagnosis not present

## 2021-04-20 DIAGNOSIS — Z5289 Donor of other specified organs or tissues: Secondary | ICD-10-CM | POA: Diagnosis not present

## 2021-04-20 DIAGNOSIS — G9389 Other specified disorders of brain: Secondary | ICD-10-CM | POA: Diagnosis present

## 2021-04-20 DIAGNOSIS — J90 Pleural effusion, not elsewhere classified: Secondary | ICD-10-CM | POA: Diagnosis not present

## 2021-04-20 DIAGNOSIS — H9191 Unspecified hearing loss, right ear: Secondary | ICD-10-CM | POA: Diagnosis present

## 2021-04-20 DIAGNOSIS — K529 Noninfective gastroenteritis and colitis, unspecified: Secondary | ICD-10-CM | POA: Diagnosis not present

## 2021-04-20 DIAGNOSIS — E039 Hypothyroidism, unspecified: Secondary | ICD-10-CM | POA: Diagnosis present

## 2021-04-20 DIAGNOSIS — R578 Other shock: Secondary | ICD-10-CM | POA: Diagnosis not present

## 2021-04-20 DIAGNOSIS — R1310 Dysphagia, unspecified: Secondary | ICD-10-CM | POA: Diagnosis present

## 2021-04-20 DIAGNOSIS — R1110 Vomiting, unspecified: Secondary | ICD-10-CM

## 2021-04-20 DIAGNOSIS — K819 Cholecystitis, unspecified: Secondary | ICD-10-CM | POA: Diagnosis not present

## 2021-04-20 DIAGNOSIS — J323 Chronic sphenoidal sinusitis: Secondary | ICD-10-CM | POA: Diagnosis not present

## 2021-04-20 DIAGNOSIS — I10 Essential (primary) hypertension: Secondary | ICD-10-CM | POA: Diagnosis present

## 2021-04-20 DIAGNOSIS — Z526 Liver donor: Secondary | ICD-10-CM | POA: Diagnosis not present

## 2021-04-20 DIAGNOSIS — R270 Ataxia, unspecified: Secondary | ICD-10-CM | POA: Diagnosis present

## 2021-04-20 DIAGNOSIS — G9382 Brain death: Secondary | ICD-10-CM | POA: Diagnosis not present

## 2021-04-20 DIAGNOSIS — E870 Hyperosmolality and hypernatremia: Secondary | ICD-10-CM | POA: Diagnosis not present

## 2021-04-20 DIAGNOSIS — J690 Pneumonitis due to inhalation of food and vomit: Secondary | ICD-10-CM

## 2021-04-20 DIAGNOSIS — R9431 Abnormal electrocardiogram [ECG] [EKG]: Secondary | ICD-10-CM | POA: Diagnosis not present

## 2021-04-20 DIAGNOSIS — Z0389 Encounter for observation for other suspected diseases and conditions ruled out: Secondary | ICD-10-CM | POA: Diagnosis not present

## 2021-04-20 DIAGNOSIS — Z8249 Family history of ischemic heart disease and other diseases of the circulatory system: Secondary | ICD-10-CM

## 2021-04-20 HISTORY — PX: APPLICATION OF CRANIAL NAVIGATION: SHX6578

## 2021-04-20 HISTORY — PX: CRANIOTOMY: SHX93

## 2021-04-20 LAB — POCT I-STAT, CHEM 8
BUN: 10 mg/dL (ref 8–23)
Calcium, Ion: 1.2 mmol/L (ref 1.15–1.40)
Chloride: 107 mmol/L (ref 98–111)
Creatinine, Ser: 0.6 mg/dL (ref 0.44–1.00)
Glucose, Bld: 113 mg/dL — ABNORMAL HIGH (ref 70–99)
HCT: 32 % — ABNORMAL LOW (ref 36.0–46.0)
Hemoglobin: 10.9 g/dL — ABNORMAL LOW (ref 12.0–15.0)
Potassium: 3.8 mmol/L (ref 3.5–5.1)
Sodium: 140 mmol/L (ref 135–145)
TCO2: 22 mmol/L (ref 22–32)

## 2021-04-20 LAB — CBC
HCT: 36.5 % (ref 36.0–46.0)
Hemoglobin: 11.9 g/dL — ABNORMAL LOW (ref 12.0–15.0)
MCH: 30.6 pg (ref 26.0–34.0)
MCHC: 32.6 g/dL (ref 30.0–36.0)
MCV: 93.8 fL (ref 80.0–100.0)
Platelets: 200 10*3/uL (ref 150–400)
RBC: 3.89 MIL/uL (ref 3.87–5.11)
RDW: 12.9 % (ref 11.5–15.5)
WBC: 16.2 10*3/uL — ABNORMAL HIGH (ref 4.0–10.5)
nRBC: 0 % (ref 0.0–0.2)

## 2021-04-20 LAB — GLUCOSE, CAPILLARY
Glucose-Capillary: 114 mg/dL — ABNORMAL HIGH (ref 70–99)
Glucose-Capillary: 134 mg/dL — ABNORMAL HIGH (ref 70–99)
Glucose-Capillary: 150 mg/dL — ABNORMAL HIGH (ref 70–99)
Glucose-Capillary: 119 mg/dL — ABNORMAL HIGH (ref 70–99)

## 2021-04-20 LAB — POCT I-STAT 7, (LYTES, BLD GAS, ICA,H+H)
Acid-base deficit: 4 mmol/L — ABNORMAL HIGH (ref 0.0–2.0)
Bicarbonate: 22.7 mmol/L (ref 20.0–28.0)
Calcium, Ion: 1.16 mmol/L (ref 1.15–1.40)
HCT: 34 % — ABNORMAL LOW (ref 36.0–46.0)
Hemoglobin: 11.6 g/dL — ABNORMAL LOW (ref 12.0–15.0)
O2 Saturation: 99 %
Potassium: 3.6 mmol/L (ref 3.5–5.1)
Sodium: 145 mmol/L (ref 135–145)
TCO2: 24 mmol/L (ref 22–32)
pCO2 arterial: 49.5 mmHg — ABNORMAL HIGH (ref 32.0–48.0)
pH, Arterial: 7.27 — ABNORMAL LOW (ref 7.350–7.450)
pO2, Arterial: 161 mmHg — ABNORMAL HIGH (ref 83.0–108.0)

## 2021-04-20 LAB — ABO/RH: ABO/RH(D): O POS

## 2021-04-20 LAB — CREATININE, SERUM
Creatinine, Ser: 0.75 mg/dL (ref 0.44–1.00)
GFR, Estimated: >60 mL/min (ref >60)

## 2021-04-20 LAB — SARS CORONAVIRUS 2 (TAT 6-24 HRS): SARS Coronavirus 2: NEGATIVE

## 2021-04-20 IMAGING — CT CT HEAD W/O CM
3 of 4 series · 15 of 47 positions shown, 18 images · non-contrast
Comparison: Brain MRI [DATE]

CLINICAL DATA: Tumor resection

EXAM:
CT HEAD WITHOUT CONTRAST
TECHNIQUE: Contiguous axial images were obtained from the base of the skull
through the vertex without intravenous contrast.

[Series 4: head 3.0 mpr cor · coronal · 0.31mm/px · 3 of 69 slices shown]
[im 23/69  brain]
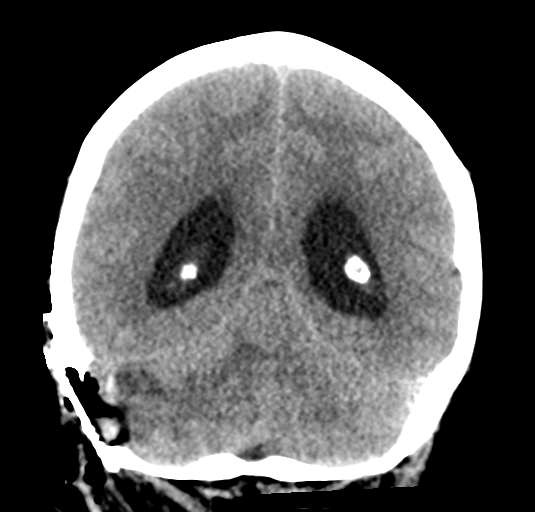
[im 31/69  brain]
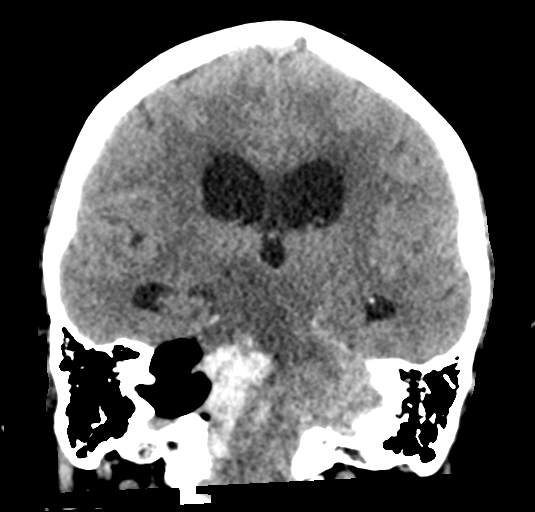
[im 38/69  brain]
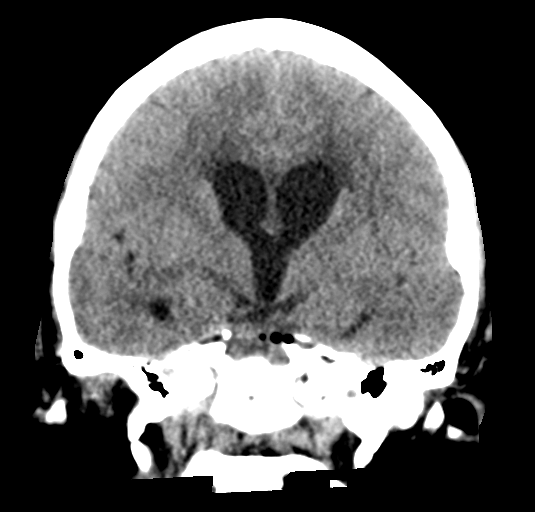

[Series 5: head 3.0 mpr sag · sagittal · 0.30mm/px · 3 of 54 slices shown]
[im 18/54  brain]
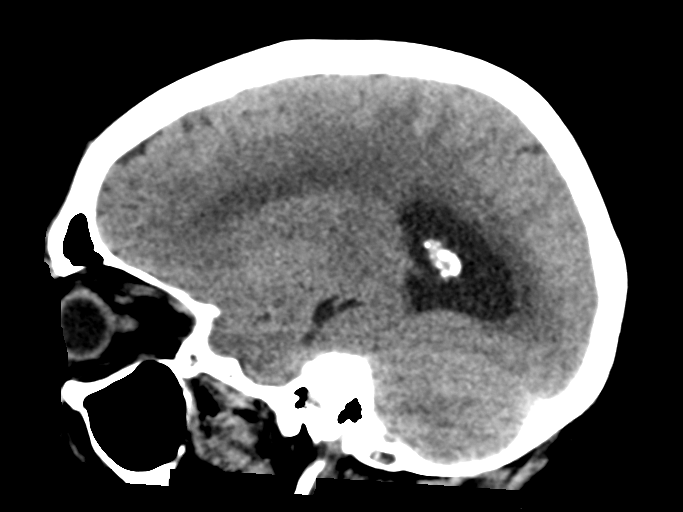
[im 27/54  brain]
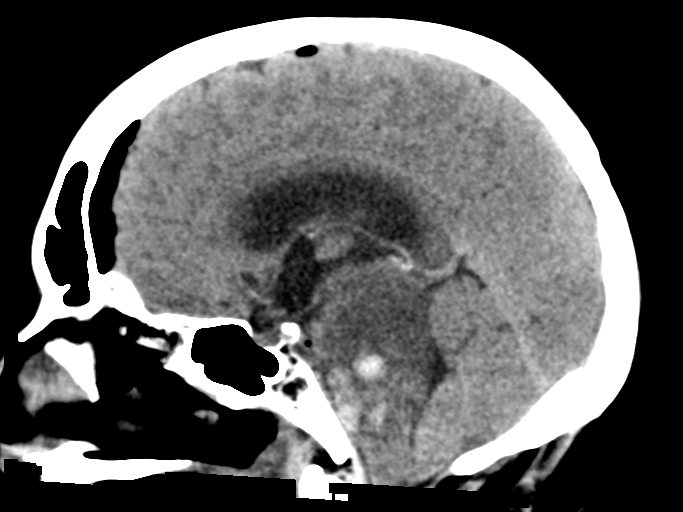
[im 36/54  brain]
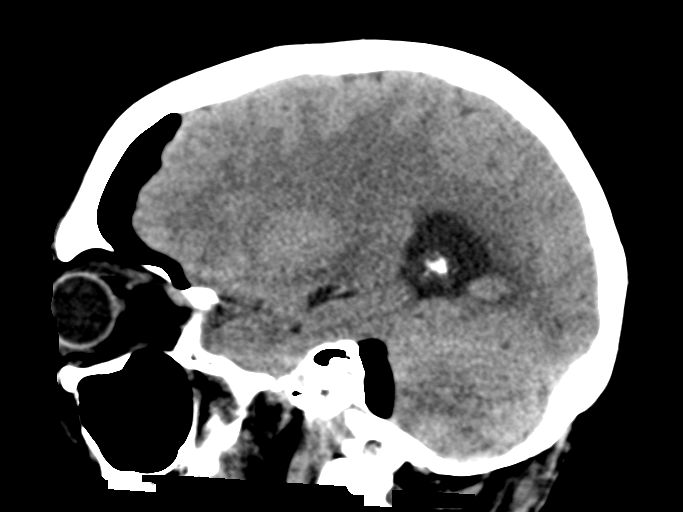

[Series 6: head 2.0 h70h · axial · 0.43mm/px · z∈[-85,+43]mm · 9 of 80 slices shown, 12 images]
[im 8/80  brain]
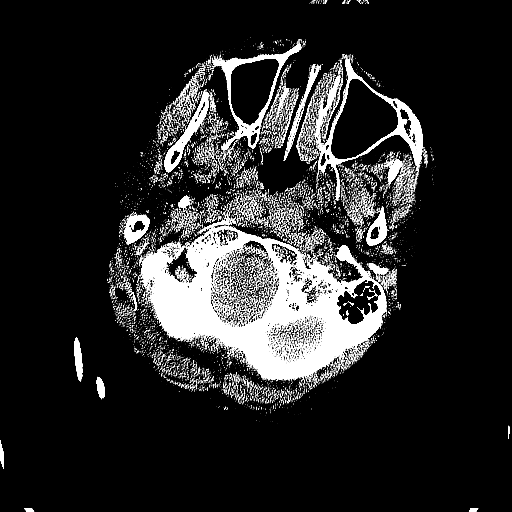
[im 8/80  bone]
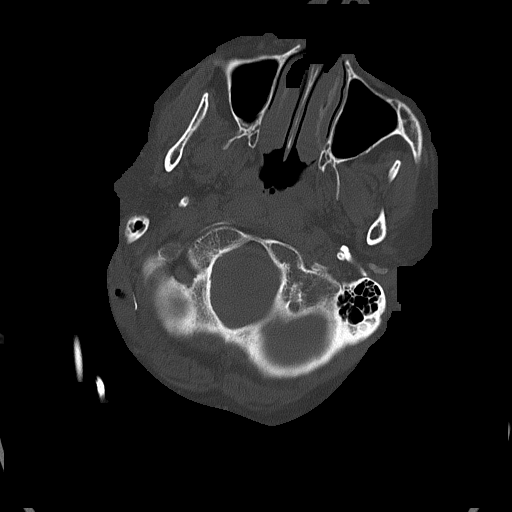
[im 16/80  brain]
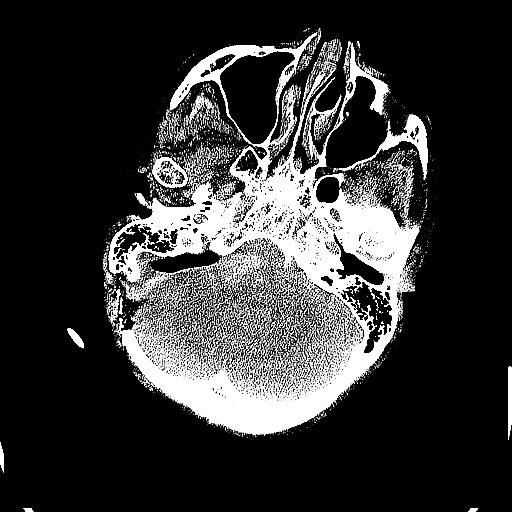
[im 24/80  brain]
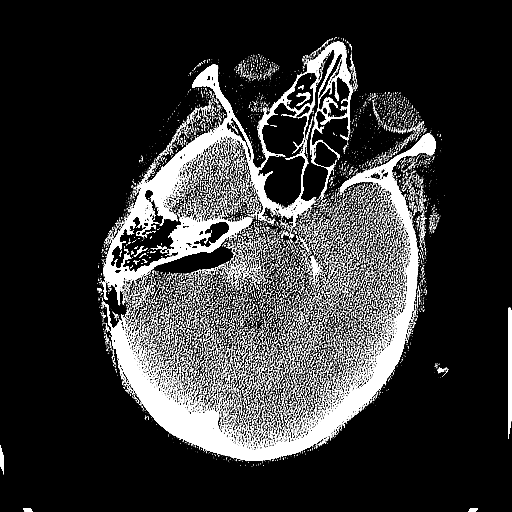
[im 32/80  brain]
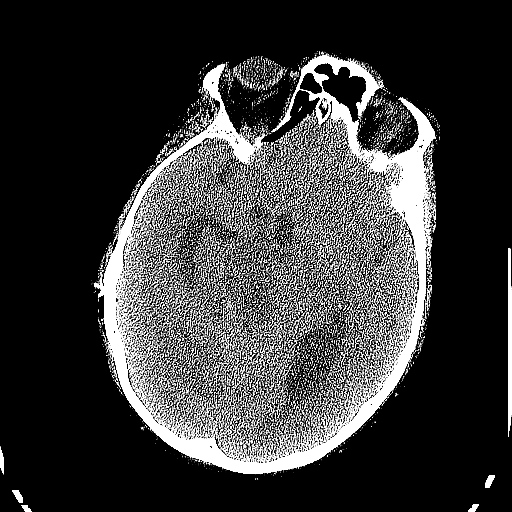
[im 40/80  brain]
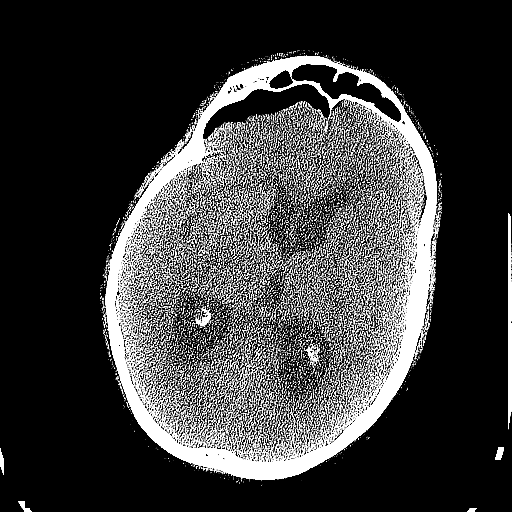
[im 40/80  bone]
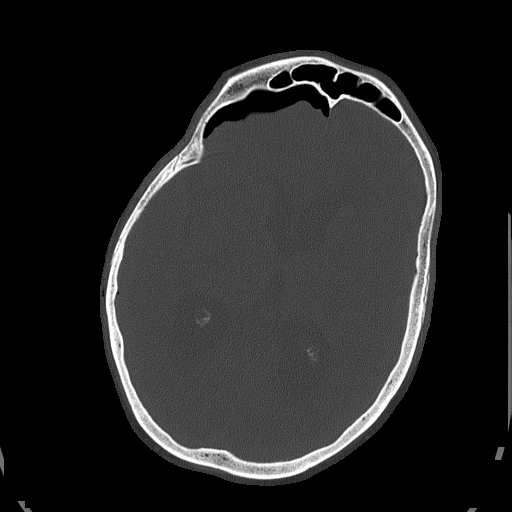
[im 48/80  brain]
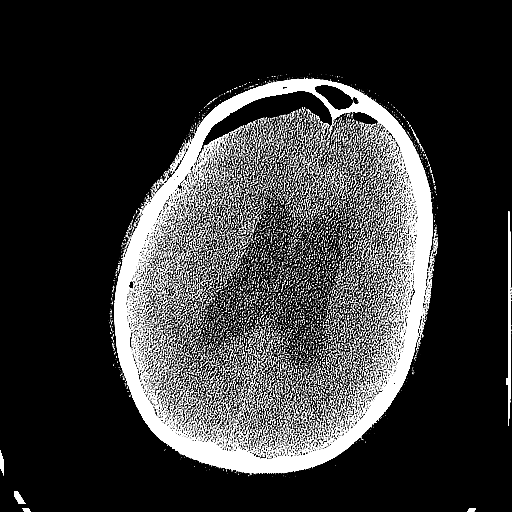
[im 56/80  brain]
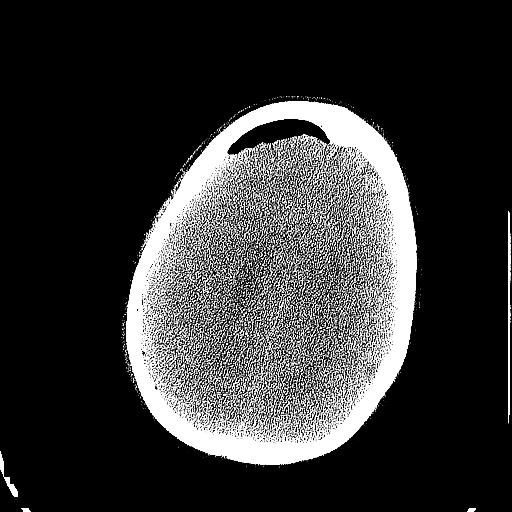
[im 64/80  brain]
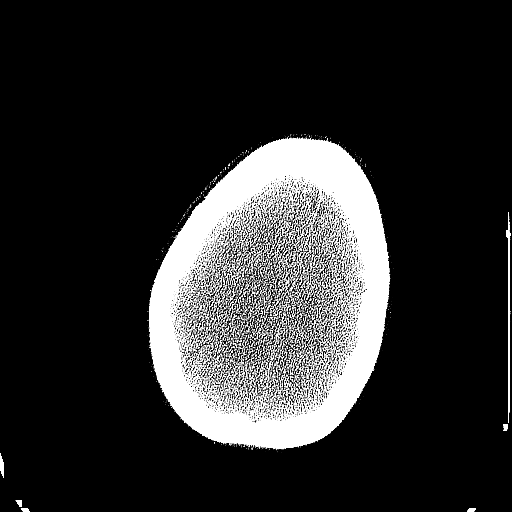
[im 72/80  brain]
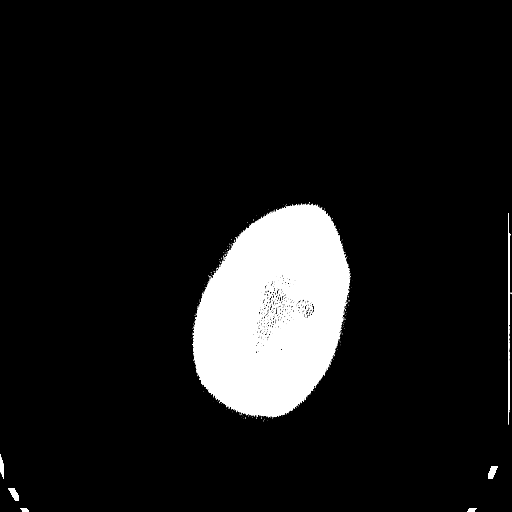
[im 72/80  bone]
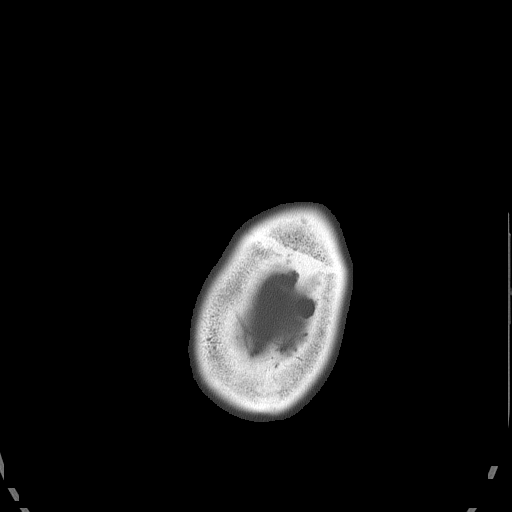

[15 of 47 positions shown; findings below may reference images not displayed]

FINDINGS: Brain: Status post resection right cerebellopontine angle mass.
There is edema within the right brainstem, unchanged. There is blood
within the resection area and moderate volume pneumocephalus. There
is unchanged obstructive hydrocephalus of the lateral and third
ventricles.

Vascular: Negative

Skull: Right retromastoid craniectomy with cranioplasty mesh.

Sinuses/Orbits: Negative

Other: None
IMPRESSION: 1. Status post resection of right cerebellopontine angle mass with
blood with mixed intraparenchymal and extraparenchymal hemorrhage
near the resection site.
2. Unchanged obstructive hydrocephalus of the lateral and third
ventricles.
3. Critical Value/emergent results were called by telephone at the
time of interpretation on [DATE] at [DATE] to provider NIA
NIA, who verbally acknowledged these results.

## 2021-04-20 SURGERY — CRANIOTOMY TUMOR EXCISION
Anesthesia: General | Laterality: Right

## 2021-04-20 MED ORDER — LIDOCAINE 2% (20 MG/ML) 5 ML SYRINGE
INTRAMUSCULAR | Status: AC
  Filled 2021-04-20: qty 5

## 2021-04-20 MED ORDER — ROCURONIUM BROMIDE 10 MG/ML (PF) SYRINGE
PREFILLED_SYRINGE | INTRAVENOUS | Status: DC | PRN
  Administered 2021-04-20 (×2): 20 mg via INTRAVENOUS
  Administered 2021-04-20: 60 mg via INTRAVENOUS
  Administered 2021-04-20 (×3): 20 mg via INTRAVENOUS
  Administered 2021-04-20: 40 mg via INTRAVENOUS

## 2021-04-20 MED ORDER — ACETAMINOPHEN 325 MG PO TABS: 650.0000 mg | ORAL_TABLET | ORAL | Status: DC | PRN

## 2021-04-20 MED ORDER — FENTANYL 2500MCG IN NS 250ML (10MCG/ML) PREMIX INFUSION
0.0000 ug/h | INTRAVENOUS | Status: DC
  Administered 2021-04-20: 50 ug/h via INTRAVENOUS
  Filled 2021-04-20: qty 250

## 2021-04-20 MED ORDER — INSULIN ASPART 100 UNIT/ML IJ SOLN
0.0000 [IU] | INTRAMUSCULAR | Status: DC
  Administered 2021-04-20 – 2021-04-21 (×3): 1 [IU] via SUBCUTANEOUS
  Administered 2021-04-21: 2 [IU] via SUBCUTANEOUS
  Administered 2021-04-21: 1 [IU] via SUBCUTANEOUS
  Administered 2021-04-21: 2 [IU] via SUBCUTANEOUS
  Administered 2021-04-22 (×2): 1 [IU] via SUBCUTANEOUS
  Administered 2021-04-22 (×2): 2 [IU] via SUBCUTANEOUS
  Administered 2021-04-22: 3 [IU] via SUBCUTANEOUS
  Administered 2021-04-23 (×3): 2 [IU] via SUBCUTANEOUS
  Administered 2021-04-23: 3 [IU] via SUBCUTANEOUS
  Administered 2021-04-23: 2 [IU] via SUBCUTANEOUS
  Administered 2021-04-23: 1 [IU] via SUBCUTANEOUS
  Administered 2021-04-23: 3 [IU] via SUBCUTANEOUS
  Administered 2021-04-24: 2 [IU] via SUBCUTANEOUS
  Administered 2021-04-24: 1 [IU] via SUBCUTANEOUS
  Administered 2021-04-24 – 2021-04-26 (×12): 2 [IU] via SUBCUTANEOUS
  Administered 2021-04-26: 3 [IU] via SUBCUTANEOUS
  Administered 2021-04-26 – 2021-04-27 (×4): 2 [IU] via SUBCUTANEOUS
  Administered 2021-04-27: 3 [IU] via SUBCUTANEOUS
  Administered 2021-04-27 (×2): 2 [IU] via SUBCUTANEOUS
  Administered 2021-04-27: 3 [IU] via SUBCUTANEOUS
  Administered 2021-04-28 – 2021-04-29 (×8): 2 [IU] via SUBCUTANEOUS
  Administered 2021-04-29: 5 [IU] via SUBCUTANEOUS

## 2021-04-20 MED ORDER — CHLORHEXIDINE GLUCONATE CLOTH 2 % EX PADS: 6.0000 | MEDICATED_PAD | Freq: Once | CUTANEOUS | Status: DC

## 2021-04-20 MED ORDER — PROPOFOL 500 MG/50ML IV EMUL
INTRAVENOUS | Status: DC | PRN
  Administered 2021-04-20: 20 ug/kg/min via INTRAVENOUS

## 2021-04-20 MED ORDER — CEFAZOLIN SODIUM 1 G IJ SOLR
INTRAMUSCULAR | Status: AC
  Filled 2021-04-20: qty 20

## 2021-04-20 MED ORDER — PROMETHAZINE HCL 25 MG PO TABS: 12.5000 mg | ORAL_TABLET | ORAL | Status: DC | PRN

## 2021-04-20 MED ORDER — CHLORHEXIDINE GLUCONATE 0.12 % MT SOLN
15.0000 mL | Freq: Once | OROMUCOSAL | Status: AC
  Administered 2021-04-20: 15 mL via OROMUCOSAL
  Filled 2021-04-20: qty 15

## 2021-04-20 MED ORDER — SODIUM CHLORIDE 0.9 % IV SOLN
0.0125 ug/kg/min | INTRAVENOUS | Status: AC
  Administered 2021-04-20: .2 ug/kg/min via INTRAVENOUS
  Administered 2021-04-20: .05 ug/kg/min via INTRAVENOUS
  Administered 2021-04-20: .15 ug/kg/min via INTRAVENOUS
  Filled 2021-04-20: qty 2000

## 2021-04-20 MED ORDER — FENTANYL CITRATE (PF) 100 MCG/2ML IJ SOLN: 50.0000 ug | Freq: Once | INTRAMUSCULAR | Status: AC

## 2021-04-20 MED ORDER — HEPARIN SODIUM (PORCINE) 5000 UNIT/ML IJ SOLN
5000.0000 [IU] | Freq: Three times a day (TID) | INTRAMUSCULAR | Status: DC
  Administered 2021-04-22 – 2021-05-05 (×40): 5000 [IU] via SUBCUTANEOUS
  Filled 2021-04-20 (×40): qty 1

## 2021-04-20 MED ORDER — PROPOFOL 10 MG/ML IV BOLUS
INTRAVENOUS | Status: AC
  Filled 2021-04-20: qty 40

## 2021-04-20 MED ORDER — FENTANYL CITRATE (PF) 100 MCG/2ML IJ SOLN: 25.0000 ug | INTRAMUSCULAR | Status: DC | PRN

## 2021-04-20 MED ORDER — APREPITANT 40 MG PO CAPS
ORAL_CAPSULE | ORAL | Status: AC
  Filled 2021-04-20: qty 1

## 2021-04-20 MED ORDER — ROCURONIUM BROMIDE 10 MG/ML (PF) SYRINGE
PREFILLED_SYRINGE | INTRAVENOUS | Status: AC
  Filled 2021-04-20: qty 10

## 2021-04-20 MED ORDER — REMIFENTANIL HCL 1 MG IV SOLR
0.0125 ug/kg/min | INTRAVENOUS | Status: DC
  Filled 2021-04-20: qty 2000

## 2021-04-20 MED ORDER — MIDAZOLAM HCL 2 MG/2ML IJ SOLN
INTRAMUSCULAR | Status: AC
  Filled 2021-04-20: qty 2

## 2021-04-20 MED ORDER — SODIUM CHLORIDE 0.9 % IV SOLN: INTRAVENOUS | Status: DC | PRN

## 2021-04-20 MED ORDER — LIDOCAINE 2% (20 MG/ML) 5 ML SYRINGE
INTRAMUSCULAR | Status: DC | PRN
  Administered 2021-04-20: 60 mg via INTRAVENOUS

## 2021-04-20 MED ORDER — METFORMIN HCL 500 MG PO TABS: 500.0000 mg | ORAL_TABLET | Freq: Every day | ORAL | Status: DC

## 2021-04-20 MED ORDER — THROMBIN 20000 UNITS EX SOLR
CUTANEOUS | Status: AC
  Filled 2021-04-20: qty 20000

## 2021-04-20 MED ORDER — CEFAZOLIN SODIUM-DEXTROSE 2-4 GM/100ML-% IV SOLN
2.0000 g | INTRAVENOUS | Status: AC
  Administered 2021-04-20 (×2): 2 g via INTRAVENOUS
  Filled 2021-04-20: qty 100

## 2021-04-20 MED ORDER — POLYETHYLENE GLYCOL 3350 17 G PO PACK: 17.0000 g | PACK | Freq: Every day | ORAL | Status: DC | PRN

## 2021-04-20 MED ORDER — HEMOSTATIC AGENTS (NO CHARGE) OPTIME
TOPICAL | Status: DC | PRN
  Administered 2021-04-20: 1 via TOPICAL

## 2021-04-20 MED ORDER — PHENYLEPHRINE 40 MCG/ML (10ML) SYRINGE FOR IV PUSH (FOR BLOOD PRESSURE SUPPORT)
PREFILLED_SYRINGE | INTRAVENOUS | Status: DC | PRN
  Administered 2021-04-20: 80 ug via INTRAVENOUS

## 2021-04-20 MED ORDER — ONDANSETRON HCL 4 MG PO TABS: 4.0000 mg | ORAL_TABLET | ORAL | Status: DC | PRN

## 2021-04-20 MED ORDER — BACITRACIN ZINC 500 UNIT/GM EX OINT
TOPICAL_OINTMENT | CUTANEOUS | Status: AC
  Filled 2021-04-20: qty 28.35

## 2021-04-20 MED ORDER — PHENYLEPHRINE HCL-NACL 10-0.9 MG/250ML-% IV SOLN
INTRAVENOUS | Status: DC | PRN
  Administered 2021-04-20: 50 ug/min via INTRAVENOUS
  Administered 2021-04-20: 30 ug/min via INTRAVENOUS
  Administered 2021-04-20: 20 ug/min via INTRAVENOUS

## 2021-04-20 MED ORDER — HYDROMORPHONE HCL 1 MG/ML IJ SOLN: 0.5000 mg | INTRAMUSCULAR | Status: DC | PRN

## 2021-04-20 MED ORDER — ORAL CARE MOUTH RINSE
15.0000 mL | OROMUCOSAL | Status: DC
  Administered 2021-04-20 – 2021-05-05 (×148): 15 mL via OROMUCOSAL

## 2021-04-20 MED ORDER — LIDOCAINE-EPINEPHRINE 1 %-1:100000 IJ SOLN
INTRAMUSCULAR | Status: DC | PRN
  Administered 2021-04-20: 9 mL

## 2021-04-20 MED ORDER — ATORVASTATIN CALCIUM 10 MG PO TABS: 20.0000 mg | ORAL_TABLET | Freq: Every day | ORAL | Status: DC

## 2021-04-20 MED ORDER — APREPITANT 40 MG PO CAPS
40.0000 mg | ORAL_CAPSULE | Freq: Once | ORAL | Status: AC
  Administered 2021-04-20: 40 mg via ORAL

## 2021-04-20 MED ORDER — ONDANSETRON HCL 4 MG/2ML IJ SOLN
INTRAMUSCULAR | Status: AC
  Filled 2021-04-20: qty 2

## 2021-04-20 MED ORDER — ACETAMINOPHEN 10 MG/ML IV SOLN: 1000.0000 mg | Freq: Once | INTRAVENOUS | Status: DC | PRN

## 2021-04-20 MED ORDER — HYDROCODONE-ACETAMINOPHEN 5-325 MG PO TABS: 1.0000 | ORAL_TABLET | ORAL | Status: DC | PRN

## 2021-04-20 MED ORDER — ACETAMINOPHEN 650 MG RE SUPP: 650.0000 mg | RECTAL | Status: DC | PRN

## 2021-04-20 MED ORDER — LIDOCAINE-EPINEPHRINE 1 %-1:100000 IJ SOLN
INTRAMUSCULAR | Status: AC
  Filled 2021-04-20: qty 1

## 2021-04-20 MED ORDER — FAMOTIDINE IN NACL 20-0.9 MG/50ML-% IV SOLN
20.0000 mg | Freq: Two times a day (BID) | INTRAVENOUS | Status: DC
  Administered 2021-04-20 – 2021-04-21 (×2): 20 mg via INTRAVENOUS
  Filled 2021-04-20 (×2): qty 50

## 2021-04-20 MED ORDER — SODIUM CHLORIDE 0.9 % IR SOLN
Status: DC | PRN
  Administered 2021-04-20 (×2): 1000 mL

## 2021-04-20 MED ORDER — ONDANSETRON HCL 4 MG/2ML IJ SOLN
4.0000 mg | INTRAMUSCULAR | Status: DC | PRN
  Administered 2021-04-30 – 2021-05-01 (×4): 4 mg via INTRAVENOUS
  Filled 2021-04-20 (×4): qty 2

## 2021-04-20 MED ORDER — FENTANYL CITRATE (PF) 250 MCG/5ML IJ SOLN
INTRAMUSCULAR | Status: AC
  Filled 2021-04-20: qty 5

## 2021-04-20 MED ORDER — MANNITOL 25 % IV SOLN
INTRAVENOUS | Status: DC | PRN
  Administered 2021-04-20: 70 g via INTRAVENOUS

## 2021-04-20 MED ORDER — FENTANYL CITRATE (PF) 250 MCG/5ML IJ SOLN
INTRAMUSCULAR | Status: DC | PRN
  Administered 2021-04-20: 25 ug via INTRAVENOUS
  Administered 2021-04-20: 50 ug via INTRAVENOUS
  Administered 2021-04-20: 100 ug via INTRAVENOUS
  Administered 2021-04-20 (×2): 25 ug via INTRAVENOUS
  Administered 2021-04-20 (×3): 50 ug via INTRAVENOUS

## 2021-04-20 MED ORDER — PROPOFOL 1000 MG/100ML IV EMUL
5.0000 ug/kg/min | INTRAVENOUS | Status: DC
  Administered 2021-04-20: 50 ug/kg/min via INTRAVENOUS
  Administered 2021-04-20: 40 ug/kg/min via INTRAVENOUS
  Administered 2021-04-21: 30 ug/kg/min via INTRAVENOUS
  Filled 2021-04-20: qty 100
  Filled 2021-04-20: qty 200
  Filled 2021-04-20: qty 100

## 2021-04-20 MED ORDER — THROMBIN 5000 UNITS EX SOLR
CUTANEOUS | Status: AC
  Filled 2021-04-20: qty 5000

## 2021-04-20 MED ORDER — ALBUMIN HUMAN 5 % IV SOLN: INTRAVENOUS | Status: DC | PRN

## 2021-04-20 MED ORDER — SUGAMMADEX SODIUM 200 MG/2ML IV SOLN
INTRAVENOUS | Status: DC | PRN
  Administered 2021-04-20: 200 mg via INTRAVENOUS

## 2021-04-20 MED ORDER — DICYCLOMINE HCL 10 MG PO CAPS
10.0000 mg | ORAL_CAPSULE | Freq: Three times a day (TID) | ORAL | Status: DC
  Filled 2021-04-20 (×3): qty 1

## 2021-04-20 MED ORDER — THROMBIN 20000 UNITS EX SOLR
CUTANEOUS | Status: DC | PRN
  Administered 2021-04-20: 20 mL via TOPICAL

## 2021-04-20 MED ORDER — ORAL CARE MOUTH RINSE: 15.0000 mL | Freq: Once | OROMUCOSAL | Status: AC

## 2021-04-20 MED ORDER — THROMBIN 5000 UNITS EX SOLR
OROMUCOSAL | Status: DC | PRN
  Administered 2021-04-20: 5 mL via TOPICAL

## 2021-04-20 MED ORDER — SODIUM CHLORIDE 0.9 % IV SOLN: INTRAVENOUS | Status: DC

## 2021-04-20 MED ORDER — PROMETHAZINE HCL 25 MG/ML IJ SOLN
6.2500 mg | INTRAMUSCULAR | Status: DC | PRN
Start: 2021-04-20 — End: 2021-04-20

## 2021-04-20 MED ORDER — BACITRACIN ZINC 500 UNIT/GM EX OINT
TOPICAL_OINTMENT | CUTANEOUS | Status: DC | PRN
  Administered 2021-04-20 (×2): 1 via TOPICAL

## 2021-04-20 MED ORDER — DOCUSATE SODIUM 100 MG PO CAPS: 100.0000 mg | ORAL_CAPSULE | Freq: Two times a day (BID) | ORAL | Status: DC

## 2021-04-20 MED ORDER — FENTANYL CITRATE (PF) 100 MCG/2ML IJ SOLN
INTRAMUSCULAR | Status: AC
  Administered 2021-04-20: 50 ug via INTRAVENOUS
  Filled 2021-04-20: qty 2

## 2021-04-20 MED ORDER — LABETALOL HCL 5 MG/ML IV SOLN
10.0000 mg | INTRAVENOUS | Status: DC | PRN
  Administered 2021-04-28: 20 mg via INTRAVENOUS
  Filled 2021-04-20: qty 4

## 2021-04-20 MED ORDER — PROPOFOL 10 MG/ML IV BOLUS
INTRAVENOUS | Status: DC | PRN
  Administered 2021-04-20: 100 mg via INTRAVENOUS
  Administered 2021-04-20: 30 mg via INTRAVENOUS
  Administered 2021-04-20: 170 mg via INTRAVENOUS

## 2021-04-20 MED ORDER — LEVOTHYROXINE SODIUM 75 MCG PO TABS: 75.0000 ug | ORAL_TABLET | Freq: Every day | ORAL | Status: DC

## 2021-04-20 MED ORDER — 0.9 % SODIUM CHLORIDE (POUR BTL) OPTIME
TOPICAL | Status: DC | PRN
  Administered 2021-04-20: 3000 mL

## 2021-04-20 MED ORDER — CEFAZOLIN SODIUM-DEXTROSE 2-4 GM/100ML-% IV SOLN
2.0000 g | Freq: Three times a day (TID) | INTRAVENOUS | Status: AC
Start: 2021-04-20 — End: 2021-04-21
  Administered 2021-04-20 – 2021-04-21 (×2): 2 g via INTRAVENOUS
  Filled 2021-04-20 (×2): qty 100

## 2021-04-20 MED ORDER — SODIUM CHLORIDE 0.9 % IV SOLN
0.0125 ug/kg/min | INTRAVENOUS | Status: DC
  Filled 2021-04-20: qty 2000

## 2021-04-20 MED ORDER — CHLORHEXIDINE GLUCONATE 0.12% ORAL RINSE (MEDLINE KIT)
15.0000 mL | Freq: Two times a day (BID) | OROMUCOSAL | Status: DC
  Administered 2021-04-20 – 2021-05-05 (×30): 15 mL via OROMUCOSAL

## 2021-04-20 MED ORDER — ONDANSETRON HCL 4 MG/2ML IJ SOLN
INTRAMUSCULAR | Status: DC | PRN
  Administered 2021-04-20: 4 mg via INTRAVENOUS

## 2021-04-20 MED ORDER — CHLORHEXIDINE GLUCONATE CLOTH 2 % EX PADS
6.0000 | MEDICATED_PAD | Freq: Every day | CUTANEOUS | Status: DC
  Administered 2021-04-20 – 2021-04-29 (×10): 6 via TOPICAL

## 2021-04-20 MED ORDER — SUCCINYLCHOLINE CHLORIDE 200 MG/10ML IV SOSY
PREFILLED_SYRINGE | INTRAVENOUS | Status: DC | PRN
  Administered 2021-04-20: 100 mg via INTRAVENOUS

## 2021-04-20 SURGICAL SUPPLY — 97 items
APL SKNCLS STERI-STRIP NONHPOA (GAUZE/BANDAGES/DRESSINGS)
BAND INSRT 18 STRL LF DISP RB (MISCELLANEOUS)
BAND RUBBER #18 3X1/16 STRL (MISCELLANEOUS) IMPLANT
BENZOIN TINCTURE PRP APPL 2/3 (GAUZE/BANDAGES/DRESSINGS) IMPLANT
BLADE CLIPPER SURG (BLADE) ×3 IMPLANT
BLADE SAW GIGLI 16 STRL (MISCELLANEOUS) IMPLANT
BLADE SURG 15 STRL LF DISP TIS (BLADE) IMPLANT
BLADE SURG 15 STRL SS (BLADE)
BNDG CMPR 75X41 PLY HI ABS (GAUZE/BANDAGES/DRESSINGS)
BNDG GAUZE ELAST 4 BULKY (GAUZE/BANDAGES/DRESSINGS) IMPLANT
BNDG STRETCH 4X75 STRL LF (GAUZE/BANDAGES/DRESSINGS) IMPLANT
BUR ACORN 9.0 PRECISION (BURR) ×3 IMPLANT
BUR ROUND FLUTED 4 SOFT TCH (BURR) IMPLANT
BUR SPIRAL ROUTER 2.3 (BUR) ×3 IMPLANT
CANISTER SUCT 3000ML PPV (MISCELLANEOUS) ×6 IMPLANT
CANNULA ADULT BIO-MEDICUS 17FR (CANNULA) ×1 IMPLANT
CATH VENTRIC 35X38 W/TROCAR LG (CATHETERS) IMPLANT
CLIP VESOCCLUDE MED 6/CT (CLIP) IMPLANT
CNTNR URN SCR LID CUP LEK RST (MISCELLANEOUS) ×2 IMPLANT
CONT SPEC 4OZ STRL OR WHT (MISCELLANEOUS) ×3
COVER MAYO STAND STRL (DRAPES) IMPLANT
COVER WAND RF STERILE (DRAPES) ×2 IMPLANT
DECANTER SPIKE VIAL GLASS SM (MISCELLANEOUS) ×3 IMPLANT
DRAIN SUBARACHNOID (WOUND CARE) IMPLANT
DRAPE HALF SHEET 40X57 (DRAPES) ×3 IMPLANT
DRAPE MICROSCOPE LEICA (MISCELLANEOUS) ×1 IMPLANT
DRAPE NEUROLOGICAL W/INCISE (DRAPES) ×3 IMPLANT
DRAPE STERI IOBAN 125X83 (DRAPES) ×1 IMPLANT
DRAPE SURG 17X23 STRL (DRAPES) IMPLANT
DRAPE WARM FLUID 44X44 (DRAPES) ×3 IMPLANT
DRSG ADAPTIC 3X8 NADH LF (GAUZE/BANDAGES/DRESSINGS) IMPLANT
DRSG TELFA 3X8 NADH (GAUZE/BANDAGES/DRESSINGS) IMPLANT
DURAPREP 6ML APPLICATOR 50/CS (WOUND CARE) ×3 IMPLANT
ELECT REM PT RETURN 9FT ADLT (ELECTROSURGICAL) ×3
ELECTRODE REM PT RTRN 9FT ADLT (ELECTROSURGICAL) ×2 IMPLANT
EVACUATOR 1/8 PVC DRAIN (DRAIN) IMPLANT
EVACUATOR SILICONE 100CC (DRAIN) IMPLANT
FORCEPS BIPOLAR SPETZLER 8 1.0 (NEUROSURGERY SUPPLIES) ×3 IMPLANT
GAUZE 4X4 16PLY RFD (DISPOSABLE) IMPLANT
GAUZE SPONGE 4X4 12PLY STRL (GAUZE/BANDAGES/DRESSINGS) IMPLANT
GLOVE BIO SURGEON STRL SZ7 (GLOVE) IMPLANT
GLOVE BIOGEL PI IND STRL 7.5 (GLOVE) ×2 IMPLANT
GLOVE BIOGEL PI INDICATOR 7.5 (GLOVE) ×1
GLOVE ECLIPSE 7.5 STRL STRAW (GLOVE) ×3 IMPLANT
GLOVE EXAM NITRILE LRG STRL (GLOVE) IMPLANT
GLOVE EXAM NITRILE XS STR PU (GLOVE) IMPLANT
GLOVE SURG UNDER POLY LF SZ7 (GLOVE) IMPLANT
GOWN STRL REUS W/ TWL LRG LVL3 (GOWN DISPOSABLE) ×4 IMPLANT
GOWN STRL REUS W/ TWL XL LVL3 (GOWN DISPOSABLE) IMPLANT
GOWN STRL REUS W/TWL 2XL LVL3 (GOWN DISPOSABLE) IMPLANT
GOWN STRL REUS W/TWL LRG LVL3 (GOWN DISPOSABLE) ×6
GOWN STRL REUS W/TWL XL LVL3 (GOWN DISPOSABLE)
GRAFT DURAGEN MATRIX 2WX2L ×1 IMPLANT
HEMOSTAT POWDER KIT SURGIFOAM (HEMOSTASIS) ×3 IMPLANT
HEMOSTAT SURGICEL 2X14 (HEMOSTASIS) ×3 IMPLANT
IV NS 1000ML (IV SOLUTION) ×6
IV NS 1000ML BAXH (IV SOLUTION) ×2 IMPLANT
KIT BASIN OR (CUSTOM PROCEDURE TRAY) ×3 IMPLANT
KIT DRAIN CSF ACCUDRAIN (MISCELLANEOUS) IMPLANT
KIT TURNOVER KIT B (KITS) ×3 IMPLANT
MARKER SPHERE PSV REFLC 13MM (MARKER) ×6 IMPLANT
NDL SPNL 18GX3.5 QUINCKE PK (NEEDLE) IMPLANT
NEEDLE HYPO 22GX1.5 SAFETY (NEEDLE) ×3 IMPLANT
NEEDLE SPNL 18GX3.5 QUINCKE PK (NEEDLE) IMPLANT
NS IRRIG 1000ML POUR BTL (IV SOLUTION) ×9 IMPLANT
PACK CRANIOTOMY CUSTOM (CUSTOM PROCEDURE TRAY) ×3 IMPLANT
PAD DRESSING TELFA 3X8 NADH (GAUZE/BANDAGES/DRESSINGS) IMPLANT
PATTIES SURGICAL .25X.25 (GAUZE/BANDAGES/DRESSINGS) IMPLANT
PATTIES SURGICAL .5 X.5 (GAUZE/BANDAGES/DRESSINGS) IMPLANT
PATTIES SURGICAL .5 X3 (DISPOSABLE) IMPLANT
PATTIES SURGICAL 1/4 X 3 (GAUZE/BANDAGES/DRESSINGS) IMPLANT
PATTIES SURGICAL 1X1 (DISPOSABLE) IMPLANT
PIN MAYFIELD SKULL DISP (PIN) ×3 IMPLANT
PLATE MALL UNIV 0.3 (Plate) ×1 IMPLANT
SCREW UNIII AXS SD 1.5X4 (Screw) ×5 IMPLANT
SEALANT ADHERUS EXTEND TIP (MISCELLANEOUS) ×1 IMPLANT
SET CARTRIDGE AND TUBING (SET/KITS/TRAYS/PACK) ×1 IMPLANT
SPECIMEN JAR SMALL (MISCELLANEOUS) ×1 IMPLANT
SPONGE NEURO XRAY DETECT 1X3 (DISPOSABLE) IMPLANT
SPONGE SURGIFOAM ABS GEL 100 (HEMOSTASIS) ×3 IMPLANT
STAPLER VISISTAT 35W (STAPLE) ×3 IMPLANT
SUT ETHILON 3 0 FSL (SUTURE) IMPLANT
SUT ETHILON 3 0 PS 1 (SUTURE) ×1 IMPLANT
SUT MNCRL AB 3-0 PS2 18 (SUTURE) IMPLANT
SUT MON AB 3-0 SH 27 (SUTURE)
SUT MON AB 3-0 SH27 (SUTURE) IMPLANT
SUT NURALON 4 0 TR CR/8 (SUTURE) ×7 IMPLANT
SUT SILK 0 TIES 10X30 (SUTURE) IMPLANT
SUT VIC AB 2-0 CP2 18 (SUTURE) ×3 IMPLANT
TIP SHEAR CVD EXTENDED 36KH (INSTRUMENTS) ×1 IMPLANT
TOWEL GREEN STERILE (TOWEL DISPOSABLE) ×3 IMPLANT
TOWEL GREEN STERILE FF (TOWEL DISPOSABLE) ×3 IMPLANT
TRAY FOLEY MTR SLVR 16FR STAT (SET/KITS/TRAYS/PACK) ×3 IMPLANT
TUBE CONNECTING 12X1/4 (SUCTIONS) ×3 IMPLANT
UNDERPAD 30X36 HEAVY ABSORB (UNDERPADS AND DIAPERS) ×3 IMPLANT
WATER STERILE IRR 1000ML POUR (IV SOLUTION) ×3 IMPLANT
WRENCH TORQUE 36KHZ (INSTRUMENTS) ×1 IMPLANT

## 2021-04-20 NOTE — Op Note (Signed)
PATIENT: Tammy Hernandez  DAY OF SURGERY: 05/27/2021   PRE-OPERATIVE DIAGNOSIS:  Petroclival meningioma   POST-OPERATIVE DIAGNOSIS:  Same   PROCEDURE:  Right retrosigmoid craniotomy for resection of petroclival mass   SURGEON:  Surgeon(s) and Role:    Judith Part, MD - Primary    Consuella Lose, MD - Assisting   ANESTHESIA: ETGA   BRIEF HISTORY: This is a 65 year old woman who presented with ataxia, cranial neuropathies, and myelopathy. The patient was found to have a large petroclival mass with brainstem edema and severe mass effect as well as obstructive hydrocephalus. I therefore recommended surgical resection. This was discussed with the patient as well as risks, benefits, and alternatives and wished to proceed with surgery.   OPERATIVE DETAIL: The patient was taken to the operating room and placed on the OR table in the supine position with the head turned. A formal time out was performed with two patient identifiers and confirmed the operative site. Anesthesia was induced by the anesthesia team. The [place] head holder was applied to the head and a registration array was attached to the [place]. This was co-registered with the patient's preoperative imaging, the fit appeared to be acceptable. Using frameless stereotaxy, the operative trajectory was planned and the incision was marked. Hair was clipped with surgical clippers over the incision and the area was then prepped and draped in a sterile fashion.  A curvilinear incision was placed in the right retromastoid region. Dissection was performed to the skull and a craniectomy was performed to expose the edges of the transverse and sigmoid sinuses. The dura was opened and flapped. I prepped for a Mare Ferrari burr hole given the hydrocephalus but after giving mannitol prior to the durotomy and raising the head, the brain was relaxed enough to get CSF from the cisterna magna, which then decompressed the brain well. The microscope was  prepped and draped in a sterile fashion and brought into the field. Dr. Kathyrn Sheriff scrubbed in to assist with the tumor dissection.   The tumor was immediately evident and consistent with a petroclival meningioma. After anatomy was appreciated, multiple dissection windows were used between the cranial nerves. It was most accessible in the interval between the 7-8 complex and the LCNs, so I sent a frozen from this region, which was consistent with meningioma or schwannoma as a prelim diagnosis. I therefore continued resection in the usual fashion. It was, as expected from the edema present on the preop scan, infiltrative into the nerves as well as the brainstem. I traced the ipsilateral vertebral to the VB junction and was able to remove the tumor off the vert, but it became adherent and calcified at the basilar and I could not safely dissect it off the basilar any further.   I then developed a cavity deep to the 7-8 complex to allow for safe dissection of these nerves, which were displaced dorsally by the tumor. The 7-8 complex appeared infiltrated and was difficult to dissect free. I therefore left a small rim of tumor on the nerves via sharp dissection to preserve function. This was similarly dissected deep to the junction of the brainstem-basilar junction, where it was again too infiltrated to safely resect.   I repeated this same technique in the 5-7/8 interval but had difficulty safely dissecting superiorly due to inability to follow the basilar more proximally and left tumor medially in this region as well.   This was the maximal safe resection that I thought was technically possible. Navigation confirmed  a good debulking but I could see gross leftover tumor so I did not think this was a GTR. Hemostasis was obtained under the microscope, the wound was copiously irrigated, the dura was reapproximated and duragen was placed with good overlap. The mastoid air cells were waxed on the approach as well as  during closure. Mesh was then applied over the duragen and Adheris (Stryker) dural sealant was used to help seal the mastoids as well as the epidural space.   All instrument and sponge counts were correct, the incision was then closed in layers. The patient was then returned to anesthesia for emergence. No apparent complications at the completion of the procedure.   EBL:  553mL   DRAINS: none   SPECIMENS: Skull base tumor   Judith Part, MD 05/03/2021 3:38 PM

## 2021-04-20 NOTE — H&P (Signed)
Surgical H&P Update  HPI: 65 y.o. woman with progressive ataxia, R sided hearing loss, and myelopathy as well as some dysphagia. The ataxia is by far the most bothersome symptom to her. Radiographic workup revealed a suspected right petroclival meningioma. No changes in health since she was last seen. Still having symptoms and wishes to proceed with surgery.  PMHx:  Past Medical History:  Diagnosis Date  . Arthritis    neck, hips - no meds  . Cystocele   . Diabetes ([place])    type 2  . Dysuria   . Hearing loss    right ear - no hearing aids  . Hyperlipidemia   . Hypothyroidism   . IBS (irritable bowel syndrome)   . Osteoporosis   . Pelvic relaxation   . PMB (postmenopausal bleeding)   . PONV (postoperative nausea and vomiting)   . Seasonal allergies   . Thyroid disease   . Urinary frequency   . Urinary urgency   . Vaginal atrophy   . Vitamin D deficiency    FamHx:  Family History  Problem Relation Age of Onset  . Heart attack Father        40  . Hypertension Mother   . Stroke Other        maternal side of family  . Aneurysm Other        maternal side of family   SocHx:  reports that she quit smoking about 50 years ago. Her smoking use included cigarettes. She has a 0.25 pack-year smoking history. She has never used smokeless tobacco. She reports that she does not drink alcohol and does not use drugs.  Physical Exam: AOx3, PERRL, FS, TM, +R sided dec'd hearing, +R facial numbness Strength 4+/5 x4, SILTx4, +hoffman's / clonus  Assesment/Plan: 65 y.o. woman with R petroclival meningioma, here for retrosig resection. Risks, benefits, and alternatives discussed and the patient would like to continue with surgery.  -OR today -4N post-op  Judith Part, MD 04/23/2021 7:31 AM

## 2021-04-20 NOTE — Transfer of Care (Signed)
Immediate Anesthesia Transfer of Care Note  Patient: Tammy Hernandez  Procedure(s) Performed: Right craniotomy for tumor resection (Right ) APPLICATION OF CRANIAL NAVIGATION (N/A )  Patient Location: ICU  Anesthesia Type:General  Level of Consciousness: Patient remains intubated per anesthesia plan  Airway & Oxygen Therapy: Patient re-intubated, Patient remains intubated per anesthesia plan and Patient placed on Ventilator (see vital sign flow sheet for setting)  Post-op Assessment: Report given to RN and Post -op Vital signs reviewed and stable  Post vital signs: Reviewed and stable  Last Vitals:  Vitals Value Taken Time  BP 150/65 04/30/2021 1700  Temp    Pulse 87 04/23/2021 1703  Resp 21 04/28/2021 1703  SpO2 95 % 04/14/2021 1703  Vitals shown include unvalidated device data.  Last Pain:  Vitals:   04/24/2021 0600  TempSrc:   PainSc: 0-No pain      Patients Stated Pain Goal: 3 (16/10/96 0454)  Complications: No complications documented.

## 2021-04-20 NOTE — Brief Op Note (Signed)
04/29/2021  3:37 PM  PATIENT:  [name]  65 y.o. female  PRE-OPERATIVE DIAGNOSIS:  Brain tumor  POST-OPERATIVE DIAGNOSIS:  Brain tumor  PROCEDURE:  Procedure(s): Right craniotomy for tumor resection (Right) APPLICATION OF CRANIAL NAVIGATION (N/A)  SURGEON:  Surgeon(s) and Role:    * Miking Usrey, Joyice Faster, MD - Primary    * Consuella Lose, MD - Assisting  PHYSICIAN ASSISTANT:   ANESTHESIA:   general  EBL:  500 mL   BLOOD ADMINISTERED:none  DRAINS: none   LOCAL MEDICATIONS USED:  LIDOCAINE   SPECIMEN:  Source of Specimen:  Skull base tumor  DISPOSITION OF SPECIMEN:  PATHOLOGY  COUNTS:  YES  TOURNIQUET:  * No tourniquets in log *  DICTATION: .Note written in EPIC  PLAN OF CARE: Admit to inpatient   PATIENT DISPOSITION:  PACU - hemodynamically stable.   Delay start of Pharmacological VTE agent (>24hrs) due to surgical blood loss or risk of bleeding: yes

## 2021-04-20 NOTE — Consult Note (Signed)
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   Patient tolerated procedure well and was extubated in the OR; unfortunately, remained too somnolent to protect her airway and was subsequently reintubated. PCCM consulted for vent management.  Pertinent Medical History:   Past Medical History:  Diagnosis Date  . Arthritis    neck, hips - no meds  . Cystocele   . Diabetes ([place])    type 2  . Dysuria   . Hearing loss    right ear - no hearing aids  . Hyperlipidemia   . Hypothyroidism   . IBS (irritable bowel syndrome)   . Osteoporosis   . Pelvic relaxation   . PMB (postmenopausal bleeding)   . PONV (postoperative nausea and vomiting)   . Seasonal allergies   . Thyroid disease   . Urinary frequency   . Urinary urgency   . Vaginal atrophy   . Vitamin D deficiency    Significant Hospital Events: Including procedures, antibiotic start and stop dates in addition to other pertinent events   . 5/17 - POD#0 R craniotomy for petroclival meningioma resection. Extubated in OR, too somnolent to protect her airway, reintubated. Admit to Neuro ICU post-op.  Interim History / Subjective:    Objective:  Blood pressure 134/61, pulse 62, temperature 98.3 F (36.8 C), temperature source Oral, resp. rate 17, height 5\' 3"  (1.6 m), weight 71.7 kg, SpO2 94 %.    Vent Mode: PRVC FiO2 (%):  [40 %] 40 % Set Rate:  [18 bmp] 18 bmp Vt Set:  [420 mL] 420 mL PEEP:  [5 cmH20] 5 cmH20   Intake/Output Summary (Last 24 hours) at 04/16/2021 1750 Last  data filed at 04/04/2021 1500 Gross per 24 hour  Intake 2700 ml  Output 2800 ml  Net -100 ml   Filed Weights   04/24/2021 0542  Weight: 71.7 kg   Physical Examination: General: Acutely ill-appearing elderly female in NAD. HEENT: [place]/AT, anicteric sclera, pupils equal round and 35mm, sluggishly reactive, moist mucous membranes. ETT in place. Neuro: Sedated. Withdraws to pain. Not following commands. +Corneal, +Cough and +Gag  CV: RRR, no m/g/r. PULM: Breathing even and unlabored on vent (PEEP 5, FiO2 40%). Lung fields CTAB anteriorly. GI: Soft, nontender, nondistended. Hypooactive bowel sounds. Extremities: No LE edema noted. Skin: Warm/dry, no rashes.  Labs/imaging that I have personally reviewed: (right click and "Reselect all SmartList Selections" daily)  I-STAT Na 140, K 3.8, BUN 10, Cr 0.6 Glucose 113  ABG 7.27/49.5/161/22.7, rate increased from 18 to 22  CBGs 85-134 last [facility]  Resolved Hospital Problem List:     Assessment & Plan:   Acute postoperative respiratory failure requiring mechanical ventilation Postoperative day #0 from R craniotomy; initially extubated in OR but remained profoundly somnolent with concern for ability to protect airway. Reintubated for postoperative respiratory failure. Per husband, patient has had issues in the past with prolonged somnolence after anesthesia (per NSGY, likely r/t HCP/brainstem edema). No significant prior pulmonary history. - Continue full vent support (4-8cc/kg IBW) - F/u repeat ABG 2200 - Wean FiO2 for O2 sat > 90% - Daily  WUA/SBT - VAP bundle - Pulmonary hygiene - PAD protocol for sedation: Propofol and Fentanyl for goal RASS 0 to -1  S/p R craniotomy with retrosigmoid resection of petroclival meningioma - Postoperative care per NSGY - Goal SBP < 160 - F/u post-op CT Head 5/17PM - MRI Brain 5/18 per NSGY - May require EVD if significant ventricular enlargement noted on imaging  Best Practice: (right click and "Reselect  all SmartList Selections" daily)   Per primary team  Labs:   CBC: Recent Labs  Lab 04/19/21 1509 04/05/2021 1047 04/16/2021 1626  WBC 9.5  --   --   HGB 13.8 10.9* 11.6*  HCT 42.1 32.0* 34.0*  MCV 91.9  --   --   PLT 293  --   --     Basic Metabolic Panel: Recent Labs  Lab 04/19/21 1509 04/30/2021 1047 04/12/2021 1626  NA 140 140 145  K 3.6 3.8 3.6  CL 107 107  --   CO2 25  --   --   GLUCOSE 101* 113*  --   BUN 11 10  --   CREATININE 0.71 0.60  --   CALCIUM 9.8  --   --    GFR: Estimated Creatinine Clearance: 65.6 mL/min (by C-G formula based on SCr of 0.6 mg/dL). Recent Labs  Lab 04/19/21 1509  WBC 9.5   Liver Function Tests: No results for input(s): AST, ALT, ALKPHOS, BILITOT, PROT, ALBUMIN in the last 168 hours. No results for input(s): LIPASE, AMYLASE in the last 168 hours. No results for input(s): AMMONIA in the last 168 hours.  ABG:    Component Value Date/Time   PHART 7.270 (L) 04/19/2021 1626   PCO2ART 49.5 (H) 04/18/2021 1626   PO2ART 161 (H) 04/14/2021 1626   HCO3 22.7 04/26/2021 1626   TCO2 24 04/14/2021 1626   ACIDBASEDEF 4.0 (H) 04/21/2021 1626   O2SAT 99.0 05/02/2021 1626    Coagulation Profile: No results for input(s): INR, PROTIME in the last 168 hours.  Cardiac Enzymes: No results for input(s): CKTOTAL, CKMB, CKMBINDEX, TROPONINI in the last 168 hours.  HbA1C: Hgb A1c MFr Bld  Date/Time Value Ref Range Status  04/19/2021 03:09 PM 5.8 (H) 4.8 - 5.6 % Final    Comment:    (NOTE) Pre diabetes:          5.7%-6.4%  Diabetes:              >6.4%  Glycemic control for   <7.0% adults with diabetes    CBG: Recent Labs  Lab 04/19/21 1436 04/19/2021 0540 04/30/2021 1625  GLUCAP 85 114* 134*    Review of Systems:   Patient is encephalopathic and/or intubated. Therefore history has been obtained from chart review.   Past Medical History:  She,  has a past medical history of Arthritis, Cystocele, Diabetes ([place]), Dysuria, Hearing loss,  Hyperlipidemia, Hypothyroidism, IBS (irritable bowel syndrome), Osteoporosis, Pelvic relaxation, PMB (postmenopausal bleeding), PONV (postoperative nausea and vomiting), Seasonal allergies, Thyroid disease, Urinary frequency, Urinary urgency, Vaginal atrophy, and Vitamin D deficiency.   Surgical History:   Past Surgical History:  Procedure Laterality Date  . ABDOMINAL HYSTERECTOMY    . ANTERIOR AND POSTERIOR REPAIR  01/26/2012   Procedure: ANTERIOR (CYSTOCELE) AND POSTERIOR REPAIR (RECTOCELE);  Surgeon: Eli Hose, MD;  Location: [facility];  Service: Gynecology;  Laterality: N/A;  . BLADDER SUSPENSION  01/26/2012   Procedure: TRANSVAGINAL TAPE (TVT) PROCEDURE;  Surgeon: Eli Hose, MD;  Location: [facility];  Service: Gynecology;  Laterality:  N/A;  TVT with Advantage Fit with cysto  . CARPAL TUNNEL RELEASE     left  . COLONOSCOPY    . ENDOMETRIAL BIOPSY  06/29/98  . EYE SURGERY Bilateral    remove cataracts  . KNEE ARTHROSCOPY Bilateral    x 2 -right and left knee   . SVD  1984   x 1  . TUBAL LIGATION    . VAGINAL HYSTERECTOMY  01/26/2012   Procedure: HYSTERECTOMY VAGINAL;  Surgeon: Eli Hose, MD;  Location: [facility];  Service: Gynecology;  Laterality: N/A;  . WISDOM TOOTH EXTRACTION      Social History:   reports that she quit smoking about 50 years ago. Her smoking use included cigarettes. She has a 0.25 pack-year smoking history. She has never used smokeless tobacco. She reports that she does not drink alcohol and does not use drugs.   Family History:  Her family history includes Aneurysm in an other family member; Heart attack in her father; Hypertension in her mother; Stroke in an other family member.   Allergies No Known Allergies   Home Medications  Prior to Admission medications   Medication Sig Start Date End Date Taking? Authorizing Provider  alendronate (FOSAMAX) 70 MG tablet Take 70 mg by mouth once a week. Takes on Monday 05/22/19  Yes [provider]  atorvastatin (LIPITOR) 20 MG tablet Take 20 mg by mouth daily. 05/23/19  Yes [provider]  B Complex Vitamins (B COMPLEX PO) Take 1 capsule by mouth daily.   Yes [provider]  CALCIUM PO Take 600 mg by mouth in the morning and at bedtime.   Yes [provider]  dicyclomine (BENTYL) 10 MG capsule Take 10 mg by mouth 3 (three) times daily.   Yes [provider]  ergocalciferol (VITAMIN D2) 50000 UNITS capsule Take 50,000 Units by mouth once a week. Saturdays   Yes [provider]  fluticasone (FLONASE) 50 MCG/ACT nasal spray Place 2 sprays into both nostrils in the morning and at bedtime. 07/25/19  Yes [provider]  levothyroxine (SYNTHROID, LEVOTHROID) 75 MCG tablet Take 75 mcg by mouth daily.   Yes [provider]  metFORMIN (GLUCOPHAGE) 500 MG tablet Take 500 mg by mouth daily. 04/30/19  Yes [provider]  ALPRAZolam (XANAX) 0.25 MG tablet Take 1-2 tabs (0.25mg -0.50mg ) 30-60 minutes before procedure. May repeat if needed.Do not drive. Patient not taking: Reported on 04/16/2021 03/31/21   Melvenia Beam, MD     Critical care time: 32 minutes   Lestine Mount, [facility] 05/10/21 5:50 PM  Please see [email or web address] for pager details.  From 7A-7P if no response, please call [phone] After hours, please call E-Link [phone]

## 2021-04-20 NOTE — Anesthesia Procedure Notes (Signed)
Procedure Name: Intubation Date/Time: 04/30/2021 4:35 PM Performed by: Suzette Battiest, MD Pre-anesthesia Checklist: Patient identified, Emergency Drugs available, Suction available and Patient being monitored Patient Re-evaluated:Patient Re-evaluated prior to induction Oxygen Delivery Method: Circle system utilized Preoxygenation: Pre-oxygenation with 100% oxygen Induction Type: IV induction Ventilation: Mask ventilation without difficulty and Oral airway inserted - appropriate to patient size Laryngoscope Size: Sabra Heck and 2 Grade View: Grade II Tube type: Oral Tube size: 7.0 mm Number of attempts: 1 Airway Equipment and Method: Stylet Placement Confirmation: ETT inserted through vocal cords under direct vision,  positive ETCO2,  breath sounds checked- equal and bilateral and CO2 detector Secured at: 21 cm Tube secured with: Tape Dental Injury: Teeth and Oropharynx as per pre-operative assessment

## 2021-04-20 NOTE — Anesthesia Preprocedure Evaluation (Addendum)
Anesthesia Evaluation  Patient identified by MRN, date of birth, ID band Patient awake    Reviewed: Allergy & Precautions, NPO status , Patient's Chart, lab work & pertinent test results  History of Anesthesia Complications (+) PONV  Airway Mallampati: II  TM Distance: >3 FB Neck ROM: Full    Dental  (+) Teeth Intact   Pulmonary neg pulmonary ROS, former smoker,    Pulmonary exam normal        Cardiovascular  Rhythm:Regular Rate:Normal     Neuro/Psych Brain tumor    GI/Hepatic   Endo/Other  diabetesHypothyroidism   Renal/GU      Musculoskeletal   Abdominal (+)  Abdomen: soft. Bowel sounds: normal.  Peds  Hematology   Anesthesia Other Findings   Reproductive/Obstetrics                            Anesthesia Physical Anesthesia Plan  ASA: III  Anesthesia Plan: General   Post-op Pain Management:    Induction: Intravenous  PONV Risk Score and Plan: 4 or greater and Ondansetron, Aprepitant, Dexamethasone, Midazolam and Treatment may vary due to age or medical condition  Airway Management Planned: Mask and Oral ETT  Additional Equipment: Arterial line  Intra-op Plan:   Post-operative Plan: Extubation in OR  Informed Consent: I have reviewed the patients History and Physical, chart, labs and discussed the procedure including the risks, benefits and alternatives for the proposed anesthesia with the patient or authorized representative who has indicated his/her understanding and acceptance.     Dental advisory given  Plan Discussed with: CRNA  Anesthesia Plan Comments: (Lab Results      Component                Value               Date                      WBC                      9.5                 04/19/2021                HGB                      13.8                04/19/2021                HCT                      42.1                04/19/2021                MCV                       91.9                04/19/2021                PLT                      293                 04/19/2021  Lab Results      Component                Value               Date                      NA                       140                 04/19/2021                K                        3.6                 04/19/2021                CO2                      25                  04/19/2021                GLUCOSE                  101 (H)             04/19/2021                BUN                      11                  04/19/2021                CREATININE               0.71                04/19/2021                CALCIUM                  9.8                 04/19/2021                GFRNONAA                 >60                 04/19/2021          )        Anesthesia Quick Evaluation

## 2021-04-20 NOTE — Progress Notes (Addendum)
Patient transported to CT scan and back to 4N17 without incidence. ETT pulled back 2cm per order. From 22cm to 20cm.

## 2021-04-20 NOTE — Progress Notes (Signed)
[place] Progress Note Patient Name: Tammy Hernandez DOB: [birth year] MRN: [id number]   Date of Service  04/28/2021  HPI/Events of Note  Patient without enteral access, Levothyroxine 75 mcg due tonight.  eICU Interventions  Levothyroxine 50 mcg iv x 1 tonight ordered.        Zynasia Burklow U Maron Stanzione 04/05/2021, 10:00 PM

## 2021-04-20 NOTE — Anesthesia Procedure Notes (Signed)
Arterial Line Insertion Start/End2022/05/24 7:05 AM, Apr 27, 2021 7:10 AM Performed by: Thelma Comp, CRNA, CRNA  Patient location: Pre-op. Preanesthetic checklist: patient identified, IV checked, site marked, risks and benefits discussed, surgical consent, monitors and equipment checked, pre-op evaluation, timeout performed and anesthesia consent Lidocaine 1% used for infiltration Left, radial was placed Catheter size: 20 Fr Hand hygiene performed  and maximum sterile barriers used   Attempts: 1 Procedure performed without using ultrasound guided technique. Following insertion, dressing applied and Biopatch. Post procedure assessment: normal and unchanged  Patient tolerated the procedure well with no immediate complications.

## 2021-04-20 NOTE — Anesthesia Procedure Notes (Deleted)
Procedure Name: Intubation Date/Time: 04/17/2021 7:51 AM Performed by: Thelma Comp, CRNA Pre-anesthesia Checklist: Patient identified, Emergency Drugs available, Suction available and Patient being monitored Patient Re-evaluated:Patient Re-evaluated prior to induction Oxygen Delivery Method: Circle System Utilized Preoxygenation: Pre-oxygenation with 100% oxygen Induction Type: IV induction Ventilation: Mask ventilation without difficulty Grade View: Grade II Tube type: Oral Tube size: 7.0 mm Number of attempts: 1 Airway Equipment and Method: Stylet Placement Confirmation: ETT inserted through vocal cords under direct vision,  positive ETCO2 and breath sounds checked- equal and bilateral Secured at: 22 cm Tube secured with: Tape Dental Injury: Teeth and Oropharynx as per pre-operative assessment

## 2021-04-20 NOTE — Progress Notes (Signed)
Neurosurgery Service Post-operative progress note  Assessment & Plan: 65 y.o. woman s/p retrosig for large suspected petroclival meningioma. Extubated in the OR but remained too somnolent to protect her airway, being reintubated for airway protection. PERRL and neutral gaze. Discussed w/ pt's husband, in the past she has been extremely somnolent for ~24h post-op. Given her preop HCP / brainstem edema, not surprised that this is occurring. Will keep intubated and get post-op CTH tonight, MRI tomorrow. Discussed w/ pt's husband that, given her somnolence, if CTH shows significant ventricular enlargement, may require EVD.   -SBP<160 -head CT after arrival to ICU -MRI tomorrow  Judith Part  04/10/2021 4:38 PM

## 2021-04-21 ENCOUNTER — Inpatient Hospital Stay (HOSPITAL_COMMUNITY): Payer: 59

## 2021-04-21 ENCOUNTER — Encounter (HOSPITAL_COMMUNITY): Payer: Self-pay | Admitting: Neurological Surgery

## 2021-04-21 DIAGNOSIS — J9601 Acute respiratory failure with hypoxia: Secondary | ICD-10-CM | POA: Diagnosis not present

## 2021-04-21 DIAGNOSIS — E876 Hypokalemia: Secondary | ICD-10-CM

## 2021-04-21 DIAGNOSIS — D496 Neoplasm of unspecified behavior of brain: Secondary | ICD-10-CM | POA: Diagnosis not present

## 2021-04-21 DIAGNOSIS — Z9889 Other specified postprocedural states: Secondary | ICD-10-CM | POA: Diagnosis not present

## 2021-04-21 LAB — BASIC METABOLIC PANEL
Anion gap: 9 (ref 5–15)
Anion gap: 6 (ref 5–15)
BUN: 7 mg/dL — ABNORMAL LOW (ref 8–23)
BUN: 11 mg/dL (ref 8–23)
CO2: 21 mmol/L — ABNORMAL LOW (ref 22–32)
CO2: 21 mmol/L — ABNORMAL LOW (ref 22–32)
Calcium: 8.4 mg/dL — ABNORMAL LOW (ref 8.9–10.3)
Calcium: 8.7 mg/dL — ABNORMAL LOW (ref 8.9–10.3)
Chloride: 107 mmol/L (ref 98–111)
Chloride: 109 mmol/L (ref 98–111)
Creatinine, Ser: 0.6 mg/dL (ref 0.44–1.00)
Creatinine, Ser: 0.66 mg/dL (ref 0.44–1.00)
GFR, Estimated: >60 mL/min (ref >60)
GFR, Estimated: >60 mL/min (ref >60)
Glucose, Bld: 116 mg/dL — ABNORMAL HIGH (ref 70–99)
Glucose, Bld: 138 mg/dL — ABNORMAL HIGH (ref 70–99)
Potassium: 2.7 mmol/L — CL (ref 3.5–5.1)
Potassium: 3.8 mmol/L (ref 3.5–5.1)
Sodium: 137 mmol/L (ref 135–145)
Sodium: 136 mmol/L (ref 135–145)

## 2021-04-21 LAB — GLUCOSE, CAPILLARY
Glucose-Capillary: 120 mg/dL — ABNORMAL HIGH (ref 70–99)
Glucose-Capillary: 123 mg/dL — ABNORMAL HIGH (ref 70–99)
Glucose-Capillary: 126 mg/dL — ABNORMAL HIGH (ref 70–99)
Glucose-Capillary: 130 mg/dL — ABNORMAL HIGH (ref 70–99)
Glucose-Capillary: 152 mg/dL — ABNORMAL HIGH (ref 70–99)
Glucose-Capillary: 191 mg/dL — ABNORMAL HIGH (ref 70–99)

## 2021-04-21 LAB — CBC WITH DIFFERENTIAL/PLATELET
Abs Immature Granulocytes: 0.07 10*3/uL (ref 0.00–0.07)
Basophils Absolute: 0 10*3/uL (ref 0.0–0.1)
Basophils Relative: 0 %
Eosinophils Absolute: 0.1 10*3/uL (ref 0.0–0.5)
Eosinophils Relative: 1 %
HCT: 35.8 % — ABNORMAL LOW (ref 36.0–46.0)
Hemoglobin: 11.8 g/dL — ABNORMAL LOW (ref 12.0–15.0)
Immature Granulocytes: 1 %
Lymphocytes Relative: 8 %
Lymphs Abs: 1 10*3/uL (ref 0.7–4.0)
MCH: 30.1 pg (ref 26.0–34.0)
MCHC: 33 g/dL (ref 30.0–36.0)
MCV: 91.3 fL (ref 80.0–100.0)
Monocytes Absolute: 1.4 10*3/uL — ABNORMAL HIGH (ref 0.1–1.0)
Monocytes Relative: 11 %
Neutro Abs: 10.3 10*3/uL — ABNORMAL HIGH (ref 1.7–7.7)
Neutrophils Relative %: 79 %
Platelets: 235 10*3/uL (ref 150–400)
RBC: 3.92 MIL/uL (ref 3.87–5.11)
RDW: 12.5 % (ref 11.5–15.5)
WBC: 12.9 10*3/uL — ABNORMAL HIGH (ref 4.0–10.5)
nRBC: 0 % (ref 0.0–0.2)

## 2021-04-21 LAB — POCT I-STAT 7, (LYTES, BLD GAS, ICA,H+H)
Acid-base deficit: 4 mmol/L — ABNORMAL HIGH (ref 0.0–2.0)
Acid-base deficit: 1 mmol/L (ref 0.0–2.0)
Acid-base deficit: 2 mmol/L (ref 0.0–2.0)
Acid-base deficit: 2 mmol/L (ref 0.0–2.0)
Bicarbonate: 20.8 mmol/L (ref 20.0–28.0)
Bicarbonate: 20.8 mmol/L (ref 20.0–28.0)
Bicarbonate: 21.6 mmol/L (ref 20.0–28.0)
Bicarbonate: 20.8 mmol/L (ref 20.0–28.0)
Calcium, Ion: 1.14 mmol/L — ABNORMAL LOW (ref 1.15–1.40)
Calcium, Ion: 1.12 mmol/L — ABNORMAL LOW (ref 1.15–1.40)
Calcium, Ion: 1.17 mmol/L (ref 1.15–1.40)
Calcium, Ion: 1.2 mmol/L (ref 1.15–1.40)
HCT: 33 % — ABNORMAL LOW (ref 36.0–46.0)
HCT: 34 % — ABNORMAL LOW (ref 36.0–46.0)
HCT: 38 % (ref 36.0–46.0)
HCT: 37 % (ref 36.0–46.0)
Hemoglobin: 11.2 g/dL — ABNORMAL LOW (ref 12.0–15.0)
Hemoglobin: 11.6 g/dL — ABNORMAL LOW (ref 12.0–15.0)
Hemoglobin: 12.9 g/dL (ref 12.0–15.0)
Hemoglobin: 12.6 g/dL (ref 12.0–15.0)
O2 Saturation: 94 %
O2 Saturation: 98 %
O2 Saturation: 98 %
O2 Saturation: 99 %
Patient temperature: 98.3
Patient temperature: 97.7
Patient temperature: 97.7
Patient temperature: 37.2
Potassium: 3.4 mmol/L — ABNORMAL LOW (ref 3.5–5.1)
Potassium: 3 mmol/L — ABNORMAL LOW (ref 3.5–5.1)
Potassium: 4 mmol/L (ref 3.5–5.1)
Potassium: 3.7 mmol/L (ref 3.5–5.1)
Sodium: 145 mmol/L (ref 135–145)
Sodium: 144 mmol/L (ref 135–145)
Sodium: 140 mmol/L (ref 135–145)
Sodium: 137 mmol/L (ref 135–145)
TCO2: 22 mmol/L (ref 22–32)
TCO2: 22 mmol/L (ref 22–32)
TCO2: 23 mmol/L (ref 22–32)
TCO2: 22 mmol/L (ref 22–32)
pCO2 arterial: 38.2 mmHg (ref 32.0–48.0)
pCO2 arterial: 24.7 mmHg — ABNORMAL LOW (ref 32.0–48.0)
pCO2 arterial: 33.7 mmHg (ref 32.0–48.0)
pCO2 arterial: 29.5 mmHg — ABNORMAL LOW (ref 32.0–48.0)
pH, Arterial: 7.345 — ABNORMAL LOW (ref 7.350–7.450)
pH, Arterial: 7.532 — ABNORMAL HIGH (ref 7.350–7.450)
pH, Arterial: 7.414 (ref 7.350–7.450)
pH, Arterial: 7.456 — ABNORMAL HIGH (ref 7.350–7.450)
pO2, Arterial: 76 mmHg — ABNORMAL LOW (ref 83.0–108.0)
pO2, Arterial: 91 mmHg (ref 83.0–108.0)
pO2, Arterial: 103 mmHg (ref 83.0–108.0)
pO2, Arterial: 108 mmHg (ref 83.0–108.0)

## 2021-04-21 LAB — TRIGLYCERIDES: Triglycerides: 86 mg/dL (ref <150)

## 2021-04-21 LAB — TSH: TSH: 0.169 u[IU]/mL — ABNORMAL LOW (ref 0.350–4.500)

## 2021-04-21 LAB — SURGICAL PATHOLOGY

## 2021-04-21 IMAGING — DX DG CHEST 1V PORT
1 series · 1 of 1 positions shown · non-contrast
Comparison: [DATE].

CLINICAL DATA: Endotracheal tube.

EXAM:
PORTABLE CHEST 1 VIEW

[chest ap]
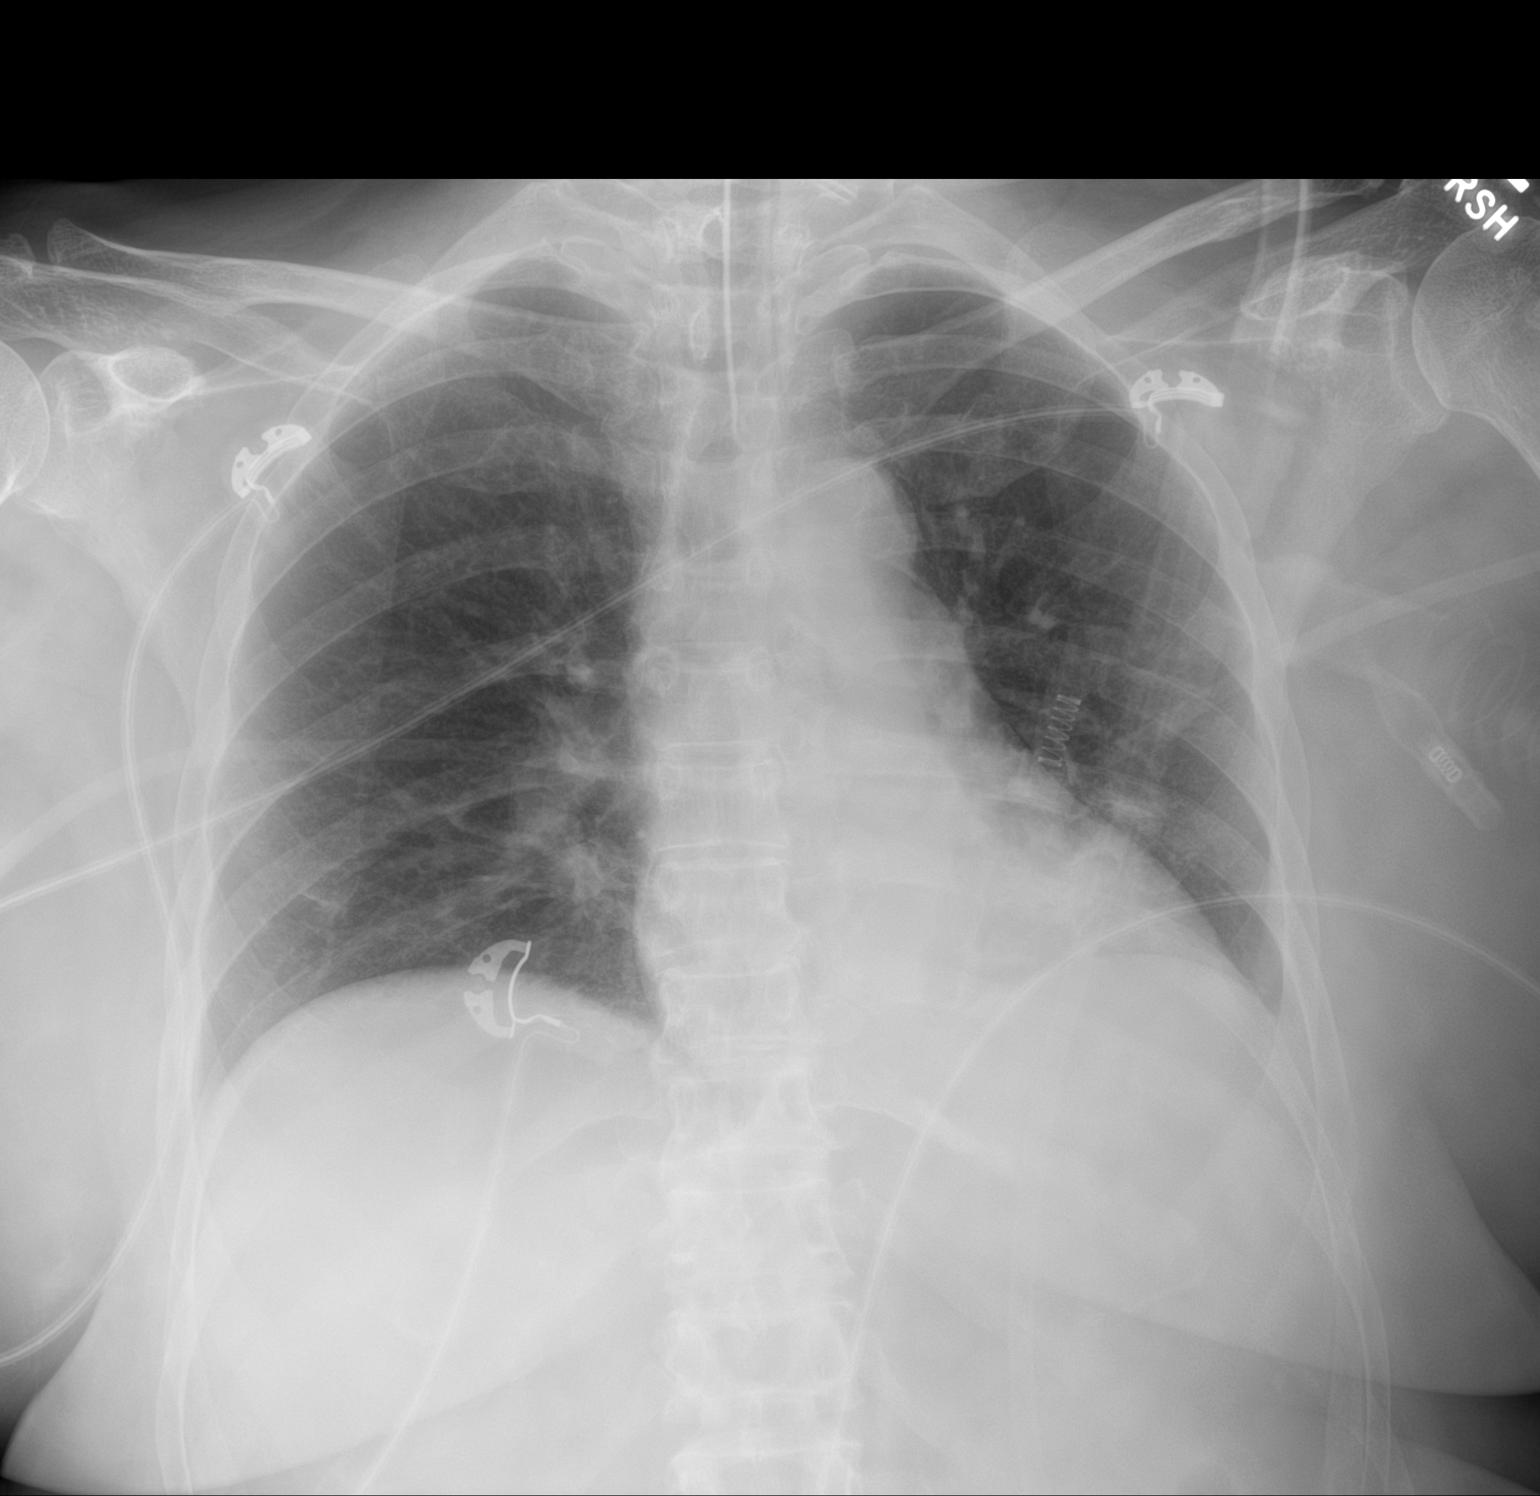

[1 of 1 positions shown; findings below may reference images not displayed]

FINDINGS: The heart size and mediastinal contours are within normal limits.
Endotracheal tube is in grossly good position. Both lungs are clear.
The visualized skeletal structures are unremarkable.
IMPRESSION: No active disease.

## 2021-04-21 IMAGING — DX DG ABDOMEN 1V
1 series · 1 of 1 positions shown · non-contrast
Comparison: None.

CLINICAL DATA: Orogastric tube positioning

EXAM:
ABDOMEN - 1 VIEW

[abdomen]
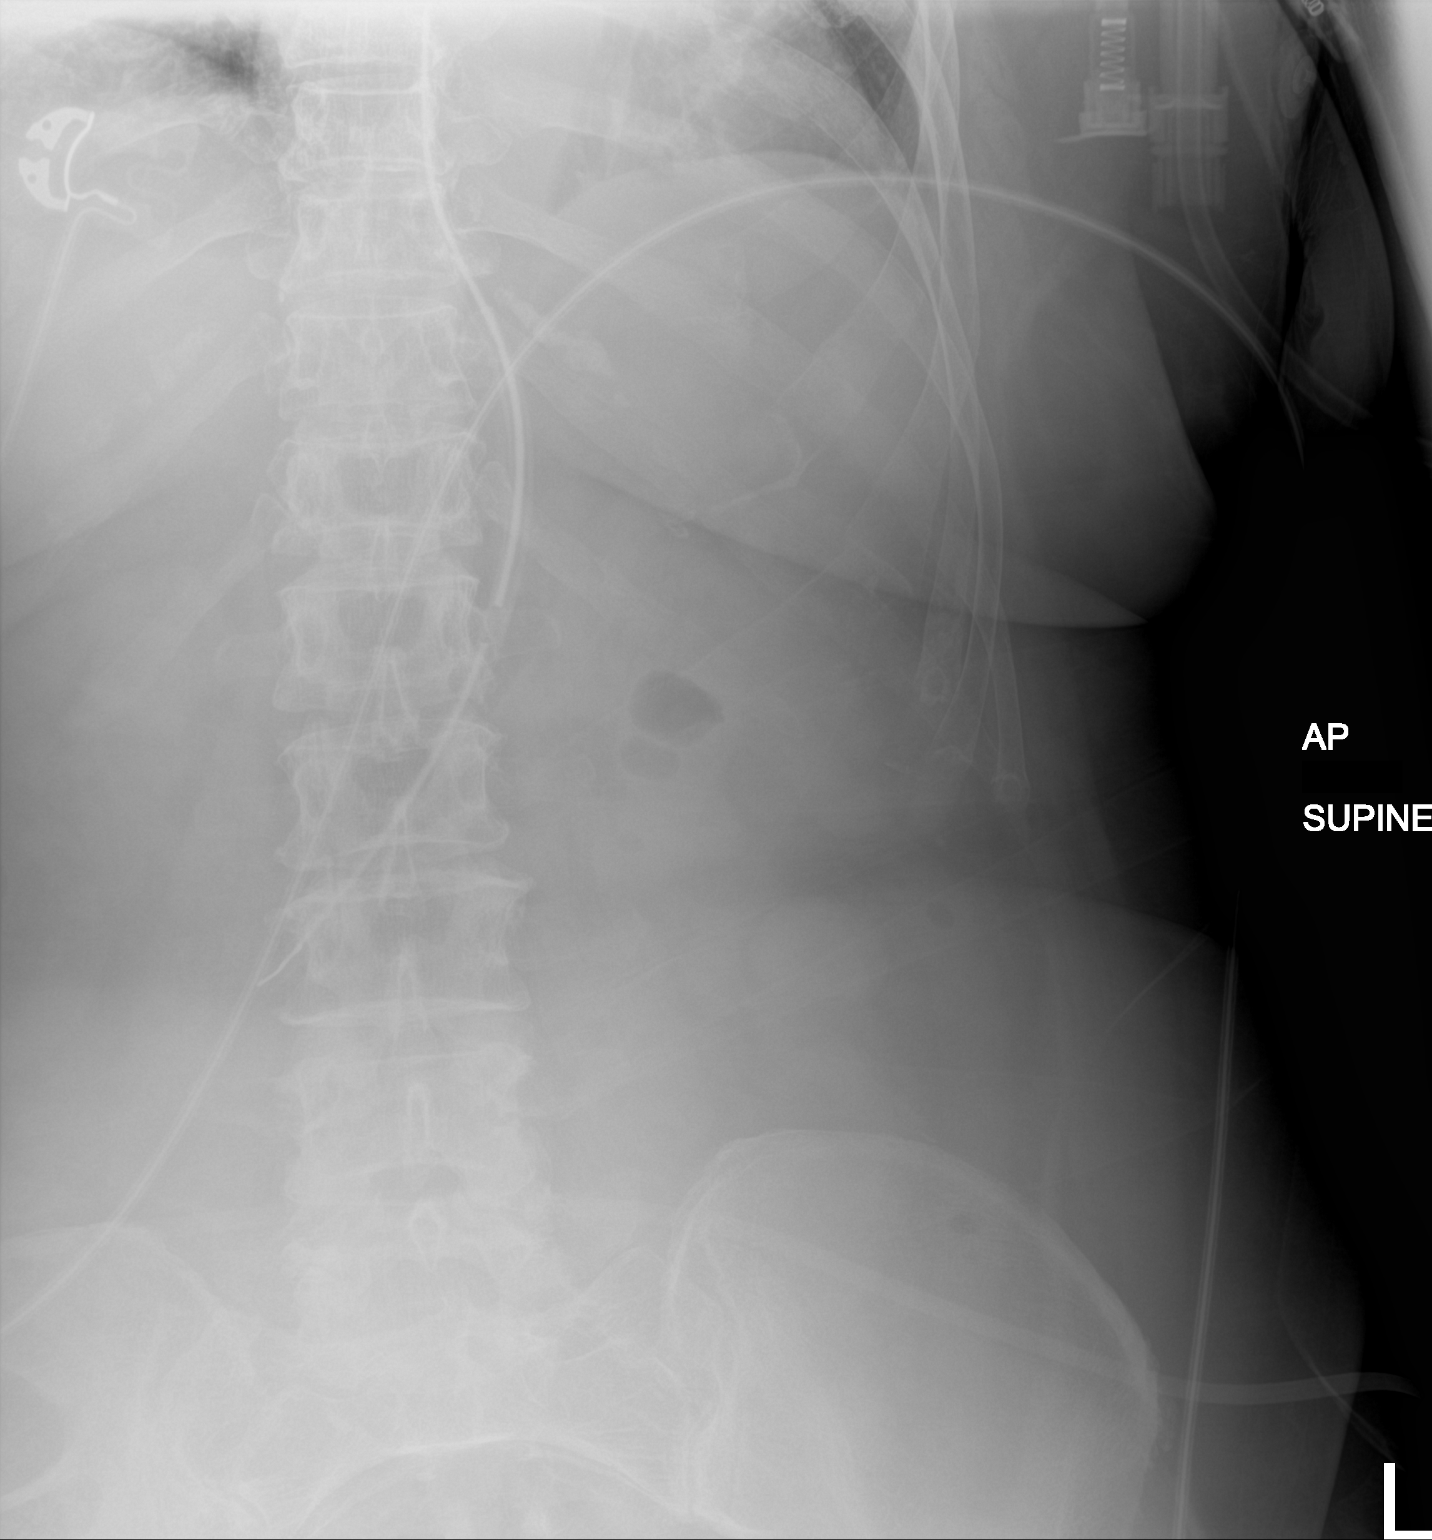

[1 of 1 positions shown; findings below may reference images not displayed]

FINDINGS: Orogastric tube tip and side port in stomach. Paucity of bowel gas
noted. No free air evident.
IMPRESSION: Orogastric tube tip and side port in stomach. Paucity of bowel gas
may be seen normally but also may be indicative of enteritis or
ileus. Bowel obstruction not felt be likely. No free air
appreciable.

## 2021-04-21 MED ORDER — ACETAMINOPHEN 325 MG PO TABS
650.0000 mg | ORAL_TABLET | ORAL | Status: DC | PRN
  Administered 2021-04-23 – 2021-05-01 (×12): 650 mg
  Filled 2021-04-21 (×12): qty 2

## 2021-04-21 MED ORDER — LEVOTHYROXINE SODIUM 75 MCG PO TABS
75.0000 ug | ORAL_TABLET | Freq: Every day | ORAL | Status: DC
  Administered 2021-04-21 – 2021-05-05 (×15): 75 ug
  Filled 2021-04-21 (×15): qty 1

## 2021-04-21 MED ORDER — POTASSIUM CHLORIDE 10 MEQ/100ML IV SOLN
10.0000 meq | INTRAVENOUS | Status: AC
  Administered 2021-04-21 (×4): 10 meq via INTRAVENOUS
  Filled 2021-04-21 (×4): qty 100

## 2021-04-21 MED ORDER — ACETAMINOPHEN 650 MG RE SUPP: 650.0000 mg | RECTAL | Status: DC | PRN

## 2021-04-21 MED ORDER — POTASSIUM CHLORIDE 20 MEQ PO PACK
40.0000 meq | PACK | Freq: Once | ORAL | Status: AC
  Administered 2021-04-21: 40 meq
  Filled 2021-04-21: qty 2

## 2021-04-21 MED ORDER — PROSOURCE TF PO LIQD
45.0000 mL | Freq: Every day | ORAL | Status: DC
  Administered 2021-04-21 – 2021-05-05 (×15): 45 mL
  Filled 2021-04-21 (×14): qty 45

## 2021-04-21 MED ORDER — POLYETHYLENE GLYCOL 3350 17 G PO PACK
17.0000 g | PACK | Freq: Every day | ORAL | Status: DC | PRN
  Administered 2021-04-22 – 2021-05-01 (×3): 17 g
  Filled 2021-04-21 (×3): qty 1

## 2021-04-21 MED ORDER — LEVOTHYROXINE SODIUM 100 MCG/5ML IV SOLN: 37.5000 ug | Freq: Every day | INTRAVENOUS | Status: DC

## 2021-04-21 MED ORDER — VITAL AF 1.2 CAL PO LIQD
1000.0000 mL | ORAL | Status: DC
  Administered 2021-04-21 – 2021-05-04 (×13): 1000 mL
  Filled 2021-04-21: qty 1000

## 2021-04-21 MED ORDER — GADOBUTROL 1 MMOL/ML IV SOLN
7.0000 mL | Freq: Once | INTRAVENOUS | Status: AC | PRN
  Administered 2021-04-21: 7 mL via INTRAVENOUS

## 2021-04-21 MED ORDER — FAMOTIDINE 20 MG PO TABS
20.0000 mg | ORAL_TABLET | Freq: Two times a day (BID) | ORAL | Status: DC
  Administered 2021-04-21 – 2021-05-05 (×28): 20 mg
  Filled 2021-04-21 (×28): qty 1

## 2021-04-21 MED ORDER — LEVOTHYROXINE SODIUM 100 MCG/5ML IV SOLN
50.0000 ug | Freq: Once | INTRAVENOUS | Status: DC
  Filled 2021-04-21: qty 5

## 2021-04-21 NOTE — Anesthesia Postprocedure Evaluation (Signed)
Anesthesia Post Note  Patient: Tammy Hernandez  Procedure(s) Performed: Right craniotomy for tumor resection (Right ) APPLICATION OF CRANIAL NAVIGATION (N/A )     Patient location during evaluation: SICU Anesthesia Type: General Level of consciousness: sedated Pain management: pain level controlled Vital Signs Assessment: post-procedure vital signs reviewed and stable Respiratory status: patient remains intubated per anesthesia plan Cardiovascular status: stable Postop Assessment: no apparent nausea or vomiting Anesthetic complications: no   No complications documented.  Last Vitals:  Vitals:   04/21/21 0900 04/21/21 1000  BP: (!) 158/73 (!) 160/62  Pulse: (!) 52 (!) 39  Resp: 19 20  Temp:    SpO2: 100% 99%    Last Pain:  Vitals:   04/21/21 0800  TempSrc: Axillary  PainSc:                  Tammy Hernandez

## 2021-04-21 NOTE — Progress Notes (Signed)
RT NOTES: Transported patient to MRI and back to room 4N17 on vent without incident.

## 2021-04-21 NOTE — Progress Notes (Signed)
PT Cancellation Note  Patient Details Name: LEIDY MASSAR MRN: [id number] DOB: [birth year]   Cancelled Treatment:    Reason Eval/Treat Not Completed: Patient's level of consciousness;Patient not medically ready. Pt with very low arousal. Holding at this time pending possible ventri per RN. Will follow-up another day as appropriate.   Moishe Spice, PT, DPT Acute Rehabilitation Services  Pager: [phone] Office: [place] 04/21/2021, 11:17 AM

## 2021-04-21 NOTE — Progress Notes (Signed)
[place] Progress Note Patient Name: Tammy Hernandez DOB: [birth year] MRN: [id number]   Date of Service  04/21/2021  HPI/Events of Note  Daytime PCCM attending requests review of ABG from this evening.  eICU Interventions  ABG reviewed. No changes indicated.        Kerry Kass Elga Santy 04/21/2021, 11:12 PM

## 2021-04-21 NOTE — Procedures (Signed)
PREOP DX: Hydrocephalus  POSTOP DX: Same  PROCEDURE: Right frontal ventriculostomy   SURGEON: Dr. Emelda Brothers, MD  ANESTHESIA: IV Sedation (versed and fentanyl) with Local  EBL: Minimal  SPECIMENS: None  COMPLICATIONS: None  CONDITION: Hemodynamically stable  INDICATIONS: Mrs. Tammy Hernandez is a 65 y.o. female woman in whom I resected a suspected petroclival meningioma. Post-op, she required re-intubation. She has a history of prolonged recovery from anesthesia but unfortunately did not have significant improvement in her exam after allowing for anesthetics to be metabolized. I discussed with her husband that EVD placement may be needed if she did not improve. By this morning we had given her enough time and called him but he did not answer, so I proceeded with EVD placement.   PROCEDURE IN DETAIL: Skin of the right frontal scalp was clipped, prepped and draped in the usual sterile fashion.  Scalp was then infiltrated with local anesthetic with epinephrine.  Skin incision was made sharply, and twist drill burr hole was made.  The dura was then incised, and the ventricular catheter was passed in 1 attempt with good flow of CSF. It was soft passed to 7cm at the skin, tunneled subcutaneously and connected to a drainage system and the skin incision closed.  The drain was then secured in place. Opening pressure was elevated, it did not shoot out of the catheter but was roughly 15cm through the catheter, with the addition of the distance to the EAC, I estimated her opening pressure at roughly 15-20cm H2O. Dermabond was then applied as a dressing after creating a strain loop.   FINDINGS: 1. Opening pressure measured at roughly 15cm-20cm H2O by manual transduction 2. Clear CSF

## 2021-04-21 NOTE — Progress Notes (Signed)
Neurosurgery Service Progress Note  Subjective: No acute events overnight, sedation held this morning without significant improvement in exam   Objective: Vitals:   04/21/21 0600 04/21/21 0700 04/21/21 0723 04/21/21 0804  BP: 140/68 134/65 134/65   Pulse: (!) 56 (!) 56 62   Resp: (!) 22 (!) 22 (!) 22   Temp:      TempSrc:      SpO2: 100% 100% 100% 99%  Weight:      Height:        Physical Exam: Intubated, sedation held, pupils mid-position and 40mm, reactive bilaterally. Corneal present on left, decreased on right, +c/g, no w/d to painful stimulus x4  Assessment & Plan: 65 y.o. 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 CTH with pneumocephalus, blood products in the resection cavity, improvement in aqueductal stenosis but persistent supratentorial ventriculomegaly, 5/18 s/p R F EVD for persistent depressed mental status.  -EVD at 5cm H2O -CCM recs for vent management, too somnolent to extubate -MRI w/wo today to evaluate tumor resection and any perforator ischemia that could be contributing to her exam. If the MRI doesn't show any ischemia that would explain her somnolence, will have to give her time. She does have a history of prolonged post-anesthesia somnolence. Combined with hydrocephalus with preop somnolence and significant brainstem edema, certainly could make emergence more prolonged. -SCDs/TEDs, hold SQH until POD2 if MRI doesn't show any additional hemorrhage  Judith Part  04/21/21 8:25 AM

## 2021-04-21 NOTE — Progress Notes (Signed)
Date and time results received: 04/21/21 0929  Test: K+ Critical Value: 2.9  Name of Provider Notified: Dr. Tacy Learn  Orders Received? Or Actions Taken?: awaiting orders

## 2021-04-21 NOTE — Progress Notes (Signed)
Initial Nutrition Assessment  DOCUMENTATION CODES:   Not applicable  INTERVENTION:   Initiate tube feeding via OG tube: Vital AF 1.2 at 50 ml/h (1200 ml per day) Prosource TF 45 ml once daily  Provides 1480 kcal, 101 gm protein, 973 ml free water daily.  MVI with minerals via tube once daily.  NUTRITION DIAGNOSIS:   Inadequate oral intake related to inability to eat as evidenced by NPO status.  GOAL:   Patient will meet greater than or equal to 90% of their needs  MONITOR:   Vent status,TF tolerance,Labs  REASON FOR ASSESSMENT:   Ventilator,Consult Enteral/tube feeding initiation and management  ASSESSMENT:   65 yo female admitted S/P R craniotomy with resection of meningioma. Course complicated by hydrocephalus and acute respiratory failure requiring intubation. PMH includes DM-2, HLD, hypothyroidism, HTN, recent diagnosis of meningioma.   Patient not responsive per discussion with RN.  Spoke with CCM, okay to begin TF today. OG tube in place.  Patient is currently intubated on ventilator support MV: 8.8 L/min Temp (24hrs), Avg:98 F (36.7 C), Min:97.7 F (36.5 C), Max:98.5 F (36.9 C)  Propofol: off  Labs reviewed. K 2.7 --> 4 CBG: 120-123-126  Medications reviewed and include Novolog, IV KCl.  Weight history reviewed.  No significant weight changes over the past month.  NUTRITION - FOCUSED PHYSICAL EXAM:  Flowsheet Row Most Recent Value  Orbital Region No depletion  Upper Arm Region No depletion  Thoracic and Lumbar Region No depletion  Buccal Region No depletion  Temple Region No depletion  Clavicle Bone Region No depletion  Clavicle and Acromion Bone Region No depletion  Scapular Bone Region Unable to assess  Dorsal Hand No depletion  Patellar Region No depletion  Anterior Thigh Region No depletion  Posterior Calf Region No depletion  Edema (RD Assessment) None  Hair Reviewed  Eyes Unable to assess  Mouth Unable to assess  Skin  Reviewed  Nails Reviewed       Diet Order:   Diet Order    None      EDUCATION NEEDS:   Not appropriate for education at this time  Skin:  Skin Assessment: Reviewed RN Assessment  Last BM:  no BM documented  Height:   Ht Readings from Last 1 Encounters:  04-22-21 5\' 3"  (1.6 m)    Weight:   Wt Readings from Last 1 Encounters:  Apr 22, 2021 71.7 kg    Ideal Body Weight:  52.3 kg  BMI:  Body mass index is 27.99 kg/m.  Estimated Nutritional Needs:   Kcal:  1425  Protein:  100-115 gm  Fluid:  >/= 1.5 L    Lucas Mallow, RD, LDN, CNSC Please refer to Amion for contact information.

## 2021-04-21 NOTE — Progress Notes (Addendum)
NAME:  Tammy Hernandez MRN:  [id number] DOB:  1956-03-13 LOS: 1 ADMISSION DATE:  04/10/2021 CONSULTATION DATE:  04/28/2021 REFERRING MD:  Zada Finders CHIEF COMPLAINT:  Acute respiratory failure s/p craniotomy  History of Present Illness:  65 year old female with PMHx significant for T2DM, HLD, hypothyroidism and recent diagnosis of petroclival meningoma as evidenced by ataxia, R-sided hearing loss, myelopathy and dysphagia who presented to [facility] 5/17 for R retrosigmoid craniotomy for resection of meningioma.   Patient tolerated procedure well and was extubated in the OR; unfortunately, remained too somnolent to protect her airway and was subsequently reintubated. PCCM consulted for vent management.  Pertinent Medical History:   Past Medical History:  Diagnosis Date  . Arthritis    neck, hips - no meds  . Cystocele   . Diabetes ([place])    type 2  . Dysuria   . Hearing loss    right ear - no hearing aids  . Hyperlipidemia   . Hypothyroidism   . IBS (irritable bowel syndrome)   . Osteoporosis   . Pelvic relaxation   . PMB (postmenopausal bleeding)   . PONV (postoperative nausea and vomiting)   . Seasonal allergies   . Thyroid disease   . Urinary frequency   . Urinary urgency   . Vaginal atrophy   . Vitamin D deficiency    Significant Hospital Events: Including procedures, antibiotic start and stop dates in addition to other pertinent events   . 5/17 - POD#0 R craniotomy for petroclival meningioma resection. Extubated in OR, too somnolent to protect her airway, reintubated. Admit to Neuro ICU post-op. . 04/21/2021 interventricular drain to be placed by neurosurgery . 04/21/2021 noted MRI . 04/21/2021 arterial line discontinued  Interim History / Subjective:  Reintubated in the postanesthesia care unit for somnolence.  Currently being sedated this is been stopped to evaluate for this.  Has a respiratory alkalosis decreased respiratory rate on ventilator to 18 with the following abg  1200  Objective:  Blood pressure 134/65, pulse 62, temperature 97.8 F (36.6 C), temperature source Axillary, resp. rate (!) 22, height _0  (1.6 m), weight 71.7 kg, SpO2 100 %.    Vent Mode: PRVC FiO2 (%):  [40 %] 40 % Set Rate:  [18 bmp-22 bmp] 22 bmp Vt Set:  [420 mL] 420 mL PEEP:  [5 cmH20] 5 cmH20 Plateau Pressure:  [11 cmH20-19 cmH20] 19 cmH20   Intake/Output Summary (Last 24 hours) at 04/21/2021 0804 Last data filed at 04/21/2021 0700 Gross per 24 hour  Intake 3112.88 ml  Output 4350 ml  Net -1237.12 ml   Filed Weights   04/07/2021 0542  Weight: 71.7 kg   Physical Examination: General: Elderly female on full mechanical monitory support sedated with propofol and fentanyl HEENT: MM pink/moist endotracheal tube is in place no gastric tube is in place Neuro: Does not respond to noxious stimuli.  Pupils are pinpoint reactive she is on fentanyl at this time.  Corneals are intact CV: Heart sounds are regular regular rate rhythm PULM: Diminished breath sounds in the bases Vent pressure regulated volume control FIO2 40% PEEP 5 RATE 22 decreased to 18 with follow-up ABG ordered VT 420  GI: soft, faint bsx4 active  GU: Amber urine Extremities: warm/dry, negative edema  Skin: no rashes or lesions   Labs/imaging that I have personally reviewed: (right click and "Reselect all SmartList Selections" daily)  04/21/2021 no chest x-ray or lab data was available at this time.  Have ordered chest x-ray CBC be met appropriate  interactions will be taken based on those results  Resolved Hospital Problem List:     Assessment & Plan:   Acute postoperative respiratory failure requiring mechanical ventilation Postoperative day #0 from R craniotomy; initially extubated in OR but remained profoundly somnolent with concern for ability to protect airway. Reintubated for postoperative respiratory failure. Per husband, patient has had issues in the past with prolonged somnolence after anesthesia  (per NSGY, likely r/t HCP/brainstem edema). No significant prior pulmonary history.  Decrease respiratory rate 18 with the respiratory alkalosis Repeat ABG 1200 hrs. 04/21/2021 Stop all sedation for further evaluation of mental status and inability to protect airway Ongoing evaluation for possible need for tracheostomy in the future  S/p R craniotomy with retrosigmoid resection of petroclival meningioma Per neurosurgery Plan is for IVC filter be placed today per neurosurgery 04/21/2021 Scheduled to go to MRI Prognosis is guarded at this time   Hypertension Labetalol has been ordered with a goal to keep systolic less than 494 If requiring multiple doses of labetalol will consider vasoactive drip DC p.o. antihypertensive at this time  Diabetes mellitus type 2 CBG (last 3)  Recent Labs    04/28/2021 2324 04/21/21 0314 04/21/21 0728  GLUCAP 119* 120* 123*    DC metformin Utilize sliding scale insulin protocol  Hypokalemia Recent Labs  Lab 04/27/2021 1626 05/04/2021 1717 04/21/2021 2139  K 3.6 3.4* 3.0*   Will recheck potassium completeness 04/21/2021 Replete as needed once labs have been reviewed   Best Practice: (right click and "Reselect all SmartList Selections" daily)   Per primary team  Labs:   CBC: Recent Labs  Lab 04/19/21 1509 04/19/2021 1047 04/25/2021 1626 04/10/2021 1717 04/29/2021 1805 04/23/2021 2139  WBC 9.5  --   --   --  16.2*  --   HGB 13.8 10.9* 11.6* 11.2* 11.9* 11.6*  HCT 42.1 32.0* 34.0* 33.0* 36.5 34.0*  MCV 91.9  --   --   --  93.8  --   PLT 293  --   --   --  200  --     Basic Metabolic Panel: Recent Labs  Lab 04/19/21 1509 04/23/2021 1047 04/05/2021 1626 04/04/2021 1717 04/11/2021 1805 04/28/2021 2139  NA 140 140 145 145  --  144  K 3.6 3.8 3.6 3.4*  --  3.0*  CL 107 107  --   --   --   --   CO2 25  --   --   --   --   --   GLUCOSE 101* 113*  --   --   --   --   BUN 11 10  --   --   --   --   CREATININE 0.71 0.60  --   --  0.75  --   CALCIUM 9.8   --   --   --   --   --    GFR: Estimated Creatinine Clearance: 65.6 mL/min (by C-G formula based on SCr of 0.75 mg/dL). Recent Labs  Lab 04/19/21 1509 05/04/2021 1805  WBC 9.5 16.2*   Liver Function Tests: No results for input(s): AST, ALT, ALKPHOS, BILITOT, PROT, ALBUMIN in the last 168 hours. No results for input(s): LIPASE, AMYLASE in the last 168 hours. No results for input(s): AMMONIA in the last 168 hours.  ABG:    Component Value Date/Time   PHART 7.532 (H) 04/18/2021 2139   PCO2ART 24.7 (L) 04/08/2021 2139   PO2ART 91 04/28/2021 2139   HCO3 20.8 04/24/2021 2139  TCO2 22 04/19/2021 2139   ACIDBASEDEF 1.0 04/11/2021 2139   O2SAT 98.0 04/07/2021 2139    Coagulation Profile: No results for input(s): INR, PROTIME in the last 168 hours.  Cardiac Enzymes: No results for input(s): CKTOTAL, CKMB, CKMBINDEX, TROPONINI in the last 168 hours.  HbA1C: Hgb A1c MFr Bld  Date/Time Value Ref Range Status  04/19/2021 03:09 PM 5.8 (H) 4.8 - 5.6 % Final    Comment:    (NOTE) Pre diabetes:          5.7%-6.4%  Diabetes:              >6.4%  Glycemic control for   <7.0% adults with diabetes    CBG: Recent Labs  Lab 04/26/2021 1625 04/22/2021 1924 04/18/2021 2324 04/21/21 0314 04/21/21 0728  GLUCAP 134* 150* 119* 120* 123*      Critical care time: 32 minutes   Richardson Landry Sheniece Ruggles ACNP Acute Care Nurse Practitioner [place] Please consult [place] 04/21/2021, 8:04 AM

## 2021-04-21 NOTE — Procedures (Signed)
04/21/2021 Orogastric tube placed without difficulty Air bolus now auscultated by RN Abdominal films been ordered Wants to confirm we will replace potassium Richardson Landry Rosemond Lyttle ACNP Acute Care Nurse Practitioner [place] Please consult [place] 04/21/2021, 10:07 AM

## 2021-04-21 NOTE — Progress Notes (Signed)
OT Cancellation Note  Patient Details Name: JAEDYN MARRUFO MRN: [id number] DOB: Apr 12, 1956   Cancelled Treatment:    Reason Eval/Treat Not Completed: Patient not medically ready (holding at this time pending possible ventri per RN)  Billey Chang, OTR/L  Acute Rehabilitation Services Pager: [phone] Office: [phone] .  04/21/2021, 8:39 AM

## 2021-04-22 ENCOUNTER — Inpatient Hospital Stay (HOSPITAL_COMMUNITY): Payer: 59

## 2021-04-22 DIAGNOSIS — J9602 Acute respiratory failure with hypercapnia: Secondary | ICD-10-CM | POA: Diagnosis not present

## 2021-04-22 DIAGNOSIS — Z9889 Other specified postprocedural states: Secondary | ICD-10-CM | POA: Diagnosis not present

## 2021-04-22 DIAGNOSIS — D496 Neoplasm of unspecified behavior of brain: Secondary | ICD-10-CM | POA: Diagnosis not present

## 2021-04-22 DIAGNOSIS — J9601 Acute respiratory failure with hypoxia: Secondary | ICD-10-CM | POA: Diagnosis not present

## 2021-04-22 LAB — GLUCOSE, CAPILLARY
Glucose-Capillary: 207 mg/dL — ABNORMAL HIGH (ref 70–99)
Glucose-Capillary: 161 mg/dL — ABNORMAL HIGH (ref 70–99)
Glucose-Capillary: 151 mg/dL — ABNORMAL HIGH (ref 70–99)
Glucose-Capillary: 140 mg/dL — ABNORMAL HIGH (ref 70–99)
Glucose-Capillary: 136 mg/dL — ABNORMAL HIGH (ref 70–99)
Glucose-Capillary: 132 mg/dL — ABNORMAL HIGH (ref 70–99)

## 2021-04-22 LAB — CBC WITH DIFFERENTIAL/PLATELET
Abs Immature Granulocytes: 0.07 10*3/uL (ref 0.00–0.07)
Basophils Absolute: 0.1 10*3/uL (ref 0.0–0.1)
Basophils Relative: 0 %
Eosinophils Absolute: 0 10*3/uL (ref 0.0–0.5)
Eosinophils Relative: 0 %
HCT: 39.1 % (ref 36.0–46.0)
Hemoglobin: 12.9 g/dL (ref 12.0–15.0)
Immature Granulocytes: 0 %
Lymphocytes Relative: 5 %
Lymphs Abs: 0.8 10*3/uL (ref 0.7–4.0)
MCH: 30.3 pg (ref 26.0–34.0)
MCHC: 33 g/dL (ref 30.0–36.0)
MCV: 91.8 fL (ref 80.0–100.0)
Monocytes Absolute: 1.7 10*3/uL — ABNORMAL HIGH (ref 0.1–1.0)
Monocytes Relative: 10 %
Neutro Abs: 13.9 10*3/uL — ABNORMAL HIGH (ref 1.7–7.7)
Neutrophils Relative %: 85 %
Platelets: 239 10*3/uL (ref 150–400)
RBC: 4.26 MIL/uL (ref 3.87–5.11)
RDW: 12.7 % (ref 11.5–15.5)
WBC: 16.6 10*3/uL — ABNORMAL HIGH (ref 4.0–10.5)
nRBC: 0 % (ref 0.0–0.2)

## 2021-04-22 LAB — BASIC METABOLIC PANEL
Anion gap: 8 (ref 5–15)
BUN: 16 mg/dL (ref 8–23)
CO2: 20 mmol/L — ABNORMAL LOW (ref 22–32)
Calcium: 8.9 mg/dL (ref 8.9–10.3)
Chloride: 108 mmol/L (ref 98–111)
Creatinine, Ser: 0.62 mg/dL (ref 0.44–1.00)
GFR, Estimated: >60 mL/min (ref >60)
Glucose, Bld: 156 mg/dL — ABNORMAL HIGH (ref 70–99)
Potassium: 3.5 mmol/L (ref 3.5–5.1)
Sodium: 136 mmol/L (ref 135–145)

## 2021-04-22 LAB — MAGNESIUM: Magnesium: 2 mg/dL (ref 1.7–2.4)

## 2021-04-22 LAB — PHOSPHORUS
Phosphorus: 1 mg/dL — CL (ref 2.5–4.6)
Phosphorus: 2.2 mg/dL — ABNORMAL LOW (ref 2.5–4.6)

## 2021-04-22 IMAGING — DX DG CHEST 1V PORT
1 series · 1 of 1 positions shown · non-contrast
Comparison: [DATE]

CLINICAL DATA: Respiratory failure

EXAM:
PORTABLE CHEST 1 VIEW

[chest ap]
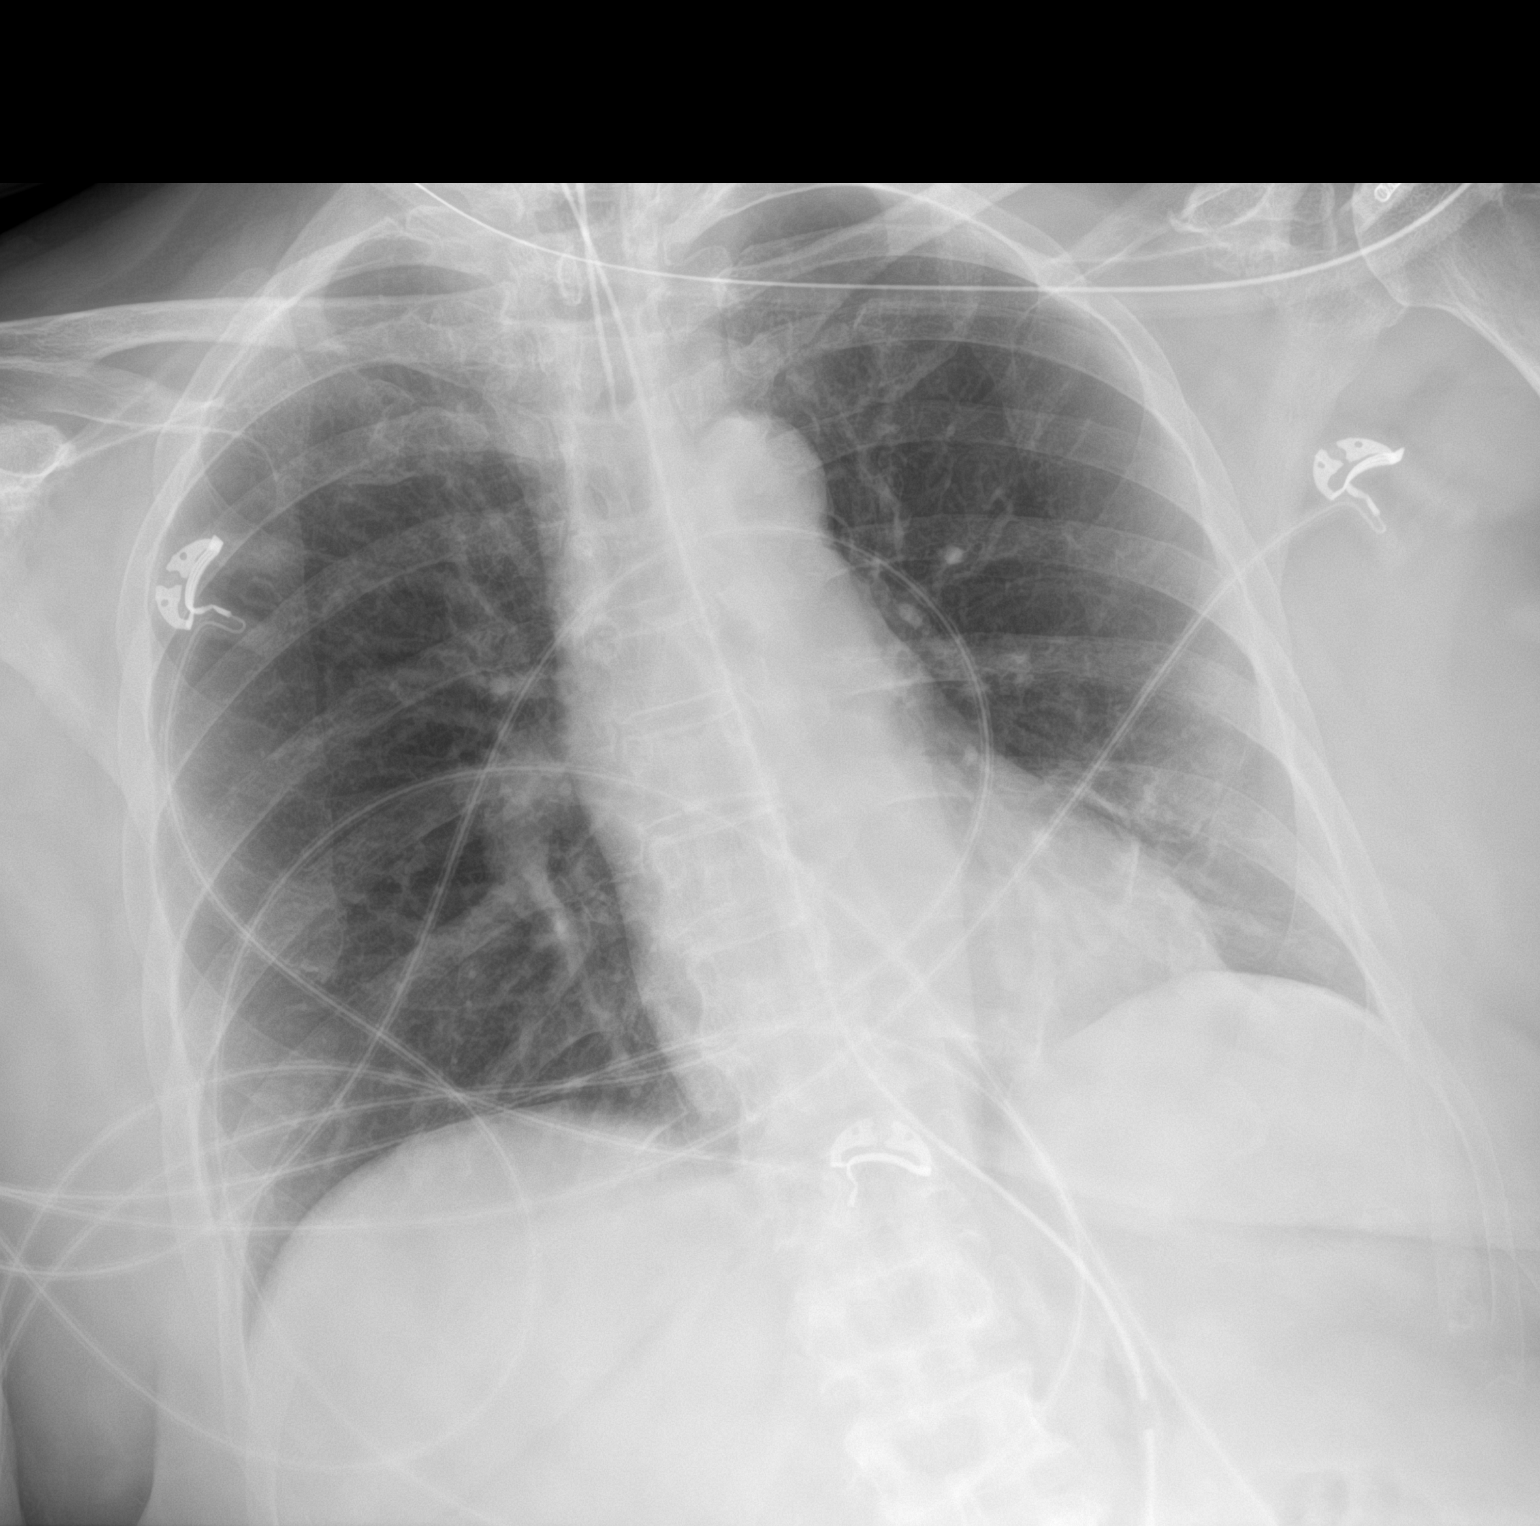

[1 of 1 positions shown; findings below may reference images not displayed]

FINDINGS: Endotracheal tube seen 4.2 cm above the carina. Nasogastric tube
extends into the upper abdomen beyond the margin of the examination.
Pulmonary insufflation is slightly improved. Minimal left basilar
atelectasis or infiltrate persists. Lungs are otherwise clear. No
pneumothorax or pleural effusion. Cardiac size is within normal
limits. Pulmonary vascularity is normal. No acute bone abnormality.
IMPRESSION: Support tubes in appropriate position.

Improved pulmonary insufflation.

Mild left basilar atelectasis or infiltrate.

## 2021-04-22 MED ORDER — POTASSIUM PHOSPHATES 15 MMOLE/5ML IV SOLN
45.0000 mmol | Freq: Once | INTRAVENOUS | Status: AC
  Administered 2021-04-22: 45 mmol via INTRAVENOUS
  Filled 2021-04-22: qty 15

## 2021-04-22 MED ORDER — HYPROMELLOSE (GONIOSCOPIC) 2.5 % OP SOLN
1.0000 [drp] | Freq: Three times a day (TID) | OPHTHALMIC | Status: DC
  Administered 2021-04-22 – 2021-05-05 (×40): 1 [drp] via OPHTHALMIC
  Filled 2021-04-22: qty 15

## 2021-04-22 NOTE — Progress Notes (Signed)
Neurosurgery Service Progress Note  Subjective: No acute events overnight, exam improved overnight  Objective: Vitals:   04/22/21 0600 04/22/21 0700 04/22/21 0723 04/22/21 0800  BP: (!) 158/71 (!) 155/65  (!) 151/71  Pulse: 92 79 83 83  Resp: (!) 24 (!) 23 (!) 25 (!) 23  Temp:      TempSrc:      SpO2: 99% 100% 100% 100%  Weight:      Height:        Physical Exam: Intubated, sedation held, eyes open spontaneously, does FC in extremities but does reliably close / open eyes to command repeatedly, right eye closure is incomplete unless with effort - likely HB3 - unable to assess facial symmetry, eyes OS medial deviation with fast saccade laterally, right without much conjugate movement   Assessment & Plan: 65 y.o. 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 CTH with pneumocephalus, blood products in the resection cavity, improvement in aqueductal stenosis but persistent supratentorial ventriculomegaly, 5/18 s/p R F EVD for persistent depressed mental status. 5/19 MRI w/ good resection, using rough measurements 86% of tumor volume, expected diffusion along the resection cavity, small dot at the right inf vs sup colliculus, no obvious perforator strokes  -cont EVD at 5cm H2O -CCM recs for vent management, too somnolent to extubate -exam improving thankfully -MRI reassuring -R eye closure is only complete with effort, likely HB3, will need to use lubricating gtt and tape shut for corneal protection - likely will help w/ diplopia too while she's awake -[facility]  Marcello Moores A Andie Mungin  04/22/21 8:40 AM

## 2021-04-22 NOTE — Progress Notes (Signed)
Rehab Admissions Coordinator Note:  Patient was screened by Cleatrice Burke for appropriateness for an Inpatient Acute Rehab Consult per therapy recs. Patient not yet at a level to pursue or fully assess for rehab. I will not place a rehab consult, but follow her progress.  Cleatrice Burke RN MSN 04/22/2021, 11:52 AM  I can be reached at [phone].

## 2021-04-22 NOTE — Progress Notes (Signed)
NAME:  Tammy Hernandez MRN:  [id number] DOB:  23-May-1956 LOS: 2 ADMISSION DATE:  04/08/2021 CONSULTATION DATE:  04/14/2021 REFERRING MD:  Zada Finders CHIEF COMPLAINT:  Acute respiratory failure s/p craniotomy  History of Present Illness:  65 year old female with PMHx significant for T2DM, HLD, hypothyroidism and recent diagnosis of petroclival meningoma as evidenced by ataxia, R-sided hearing loss, myelopathy and dysphagia who presented to [facility] 5/17 for R retrosigmoid craniotomy for resection of meningioma.   Patient tolerated procedure well and was extubated in the OR; unfortunately, remained too somnolent to protect her airway and was subsequently reintubated. PCCM consulted for vent management.  Significant Hospital Events: Including procedures, antibiotic start and stop dates in addition to other pertinent events   . 5/17 - POD#0 R craniotomy for petroclival meningioma resection. Extubated in OR, too somnolent to protect her airway, reintubated. Admit to Neuro ICU post-op. . 04/21/2021 interventricular drain to be placed by neurosurgery . 04/21/2021 noted MRI . 04/21/2021 arterial line discontinued  Interim History / Subjective:  Remain on mechanical ventilator, off sedation Afebrile MRI brain done  Objective:  Blood pressure (!) 154/52, pulse 91, temperature 99.6 F (37.6 C), temperature source Axillary, resp. rate (!) 26, height _0  (1.6 m), weight 70.6 kg, SpO2 99 %.    Vent Mode: PRVC FiO2 (%):  [40 %] 40 % Set Rate:  [16 bmp] 16 bmp Vt Set:  [420 mL] 420 mL PEEP:  [5 cmH20] 5 cmH20 Pressure Support:  [10 cmH20] 10 cmH20 Plateau Pressure:  [15 cmH20-17 cmH20] 15 cmH20   Intake/Output Summary (Last 24 hours) at 04/22/2021 1001 Last data filed at 04/22/2021 0900 Gross per 24 hour  Intake 1678.77 ml  Output 1463 ml  Net 215.77 ml   Filed Weights   04/22/2021 0542 04/22/21 0500  Weight: 71.7 kg 70.6 kg   Physical Examination:   Physical exam: General: Critically  ill-appearing female, orally intubated HEENT: Status post EVD.  ETT and OGT in place Neuro: Opens eyes spontaneously with inward movement of left eye, partially closed right eye, pupils are reactive bilaterally with positive cough.  Withdrawing in right lower extremity, flickering movement in left lower, no movement on upper extremity to painful stimuli Chest: Coarse breath sounds, no wheezes or rhonchi Heart: Regular rate and rhythm, no murmurs or gallops Abdomen: Soft, nontender, nondistended, bowel sounds present Skin: No rash   Labs/imaging that I have personally reviewed: (right click and "Reselect all SmartList Selections" daily)  04/21/2021 no chest x-ray or lab data was available at this time.  Have ordered chest x-ray CBC be met appropriate interactions will be taken based on those results  MRI brain 5/18: Postsurgical changes from subtotal resection of large right petroclival meningioma with residual tumor measuring approximately 2.3 x 1.5 cm. 2. Persistent prominent edema involving the brainstem and extending to the thalamus and lentiform nucleus resulting in narrowing of the cerebral aqueduct with supratentorial hydrocephalus, stable since prior postoperative CT. 3. Right frontal approach ventricular drain is well positioned with the tip in the frontal horn of the right lateral ventricle  Resolved Hospital Problem List:   Acute respiratory acidosis  Assessment & Plan:   Acute postoperative respiratory failure with hypoxia and hypercapnia requiring mechanical ventilation Patient was reintubated postprocedure as she was somnolent and unable to protect her airway, also noted to have hypercapnia Continue lung protective ventilation VAP bundle  Meningioma s/p R craniotomy with retrosigmoid resection of petroclival meningioma Obstructive hydrocephalus s/p EVD Management per neurosurgery EVD in place MRIs done  which showed resection of tumor and expected postop  changes  Hypertension Labetalol has been ordered with a goal to keep systolic less than 073 Closely monitor  Diabetes mellitus type 2 Hold home metformin Continue sliding scale insulin  Hypokalemia Hypophosphatemia Continue aggressive electrolyte supplement and monitor   Best Practice: (right click and "Reselect all SmartList Selections" daily)   Per primary team     Total critical care time: 35 minutes  Performed by: [place] care time was exclusive of separately billable procedures and treating other patients.   Critical care was necessary to treat or prevent imminent or life-threatening deterioration.   Critical care was time spent personally by me on the following activities: development of treatment plan with patient and/or surrogate as well as nursing, discussions with consultants, evaluation of patient's response to treatment, examination of patient, obtaining history from patient or surrogate, ordering and performing treatments and interventions, ordering and review of laboratory studies, ordering and review of radiographic studies, pulse oximetry and re-evaluation of patient's condition.   Jacky Kindle MD  Pulmonary Critical Care See Amion for pager If no response to pager, please call [phone] until 7pm After 7pm, Please call E-link [phone]

## 2021-04-22 NOTE — Progress Notes (Signed)
Date and time results received: 04/22/21 0325   Test: Phosphurus Critical Value: 1.0  Name of Provider Notified: E Link RN Notified   Orders Received? Or Actions Taken? Awaiting new orders

## 2021-04-22 NOTE — Progress Notes (Signed)
[place] Progress Note Patient Name: ALIANAH LOFTON DOB: Sep 26, 1956 MRN: [id number]   Date of Service  04/22/2021  HPI/Events of Note  PHOS 1.0, K+ 3.5.  eICU Interventions  K+PHOS 45 mmol iv x 1 per adult electrolyte replacement protocol.        Kerry Kass Vinod Mikesell 04/22/2021, 3:53 AM

## 2021-04-22 NOTE — Progress Notes (Signed)
Occupational Therapy Evaluation Patient Details Name: Tammy Hernandez MRN: [id number] DOB: 12/26/1955 Today's Date: 04/22/2021    History of Present Illness 65 y.o. woman with progressive ataxia, R sided hearing loss, and myelopathy as well as some dysphagia. 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 REintubated in OR due to somnolence. 5/18 Right frontal ventriculostomy with EVD placement. /19 MRI w/ good resection, using rough measurements 86% of tumor volume, expected diffusion along the resection cavity, small dot at the right inf vs sup colliculus, no obvious perforator strokes. PMH - arthritis; pre-diabetic; osteoporosis.   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 Pt drove and was independent with all IADL tasks although PTA, pt was experiencing "blance problems" per husband (ataxic gait) and difficulty swallowing. EVD clamped prior to session; PRVC; peep 5; 40% FiO2. Eval very limited by lethargy, inconsistently following commands, L>R with delay in processing. R gaze preference with beating movements to L field. Pt initially tachy with HR increasing to 134 when HOB increased. Pt will most likely need post acute rehab - will further assess. Will follow acutely. Nsg notified that L foot/fingers are much colder than R side.      Follow Up Recommendations  Supervision/Assistance - 24 hour (Will further assess)    Equipment Recommendations  Other (comment) (TBA)    Recommendations for Other Services Other (comment) (Will further assess)     Precautions / Restrictions Precautions Precautions: Other (comment) (ventric; intubated/ET/vent) Restrictions Other Position/Activity Restrictions: EVD      Mobility Bed Mobility               General bed mobility comments: total A    Transfers                 General transfer comment: not appropriate to attmept today    Balance  Overall balance assessment: History of Falls (hx of ataxia)                                         ADL either performed or assessed with clinical judgement   ADL                                               Vision Baseline Vision/History: Wears glasses Wears Glasses: Reading only Patient Visual Report:  (R eye taped shut) Vision Assessment?: Yes;Vision impaired- to be further tested in functional context Eye Alignment: Impaired (comment) Alignment/Gaze Preference: Gaze right (beating movement L) Additional Comments: Will further assess     Perception Perception Comments: Will further assess   Praxis      Pertinent Vitals/Pain Pain Assessment: Faces Faces Pain Scale: No hurt     Hand Dominance Right   Extremity/Trunk Assessment Upper Extremity Assessment Upper Extremity Assessment: Generalized weakness (BUE PROM WFL; no active movement observed; no withdrawal from noxious stimuli)   Lower Extremity Assessment Lower Extremity Assessment: Defer to PT evaluation   Cervical / Trunk Assessment Cervical / Trunk Assessment: Normal   Communication Communication Communication: HOH (R ear)   Cognition Arousal/Alertness: Lethargic  General Comments: Inconsistnetly following commands with delay; lethargic; not sedated per nsg   General Comments  Loves to play wiht her cat "Socks"; enjoys baking; gardening    Exercises Exercises: Other exercises Other Exercises Other Exercises: BUE PROM through full ROM   Shoulder Instructions      Home Living Family/patient expects to be discharged to:: Private residence Living Arrangements: Spouse/significant other Available Help at Discharge: Available 24 hours/day;Family Type of Home: House Home Access: Stairs to enter [employer] of Steps: 3 Entrance Stairs-Rails: Left Home Layout: One level     Bathroom Shower/Tub:  Tub/shower unit;Curtain   Bathroom Toilet: Handicapped height Bathroom Accessibility: Yes How Accessible: Accessible via walker Home Equipment: [place] - 2 wheels;Cane - single point;Shower seat (may have a shower chair)   Additional Comments: Live close to [facility]      Prior Functioning/Environment Level of Independence: Independent        Comments: drives; 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 Was having balance problems for 2-3 months; difficulty swallowing as well.        OT Problem List: Decreased strength;Decreased range of motion;Decreased activity tolerance;Impaired balance (sitting and/or standing);Impaired vision/perception;Decreased coordination;Decreased cognition;Decreased safety awareness;Decreased knowledge of use of DME or AE;Decreased knowledge of precautions;Cardiopulmonary status limiting activity;Impaired sensation;Obesity;Impaired UE functional use;Increased edema      OT Treatment/Interventions: Self-care/ADL training;Therapeutic exercise;Neuromuscular education;DME and/or AE instruction;Therapeutic activities;Cognitive remediation/compensation;Visual/perceptual remediation/compensation;Patient/family education;Balance training    OT Goals(Current goals can be found in the care plan section) Acute Rehab OT Goals Patient Stated Goal: per husband for his wife to get better OT Goal Formulation: With family Time For Goal Achievement: 05/06/21 Potential to Achieve Goals: Good  OT Frequency: Min 2X/week   Barriers to D/C:            Co-evaluation PT/OT/SLP Co-Evaluation/Treatment: Yes (partial session) Reason for Co-Treatment: Complexity of the patient's impairments (multi-system involvement)   OT goals addressed during session: ADL's and self-care      AM-PAC OT "6 Clicks" Daily Activity     Outcome Measure Help from another person eating meals?: Total Help from another person taking care of personal  grooming?: Total Help from another person toileting, which includes using toliet, bedpan, or urinal?: Total Help from another person bathing (including washing, rinsing, drying)?: Total Help from another person to put on and taking off regular upper body clothing?: Total Help from another person to put on and taking off regular lower body clothing?: Total 6 Click Score: 6   End of Session Nurse Communication: Other (comment) (BUE elevated to decrease dependent edema)  Activity Tolerance: Patient limited by lethargy Patient left: in bed;with call bell/phone within reach;with family/visitor present  OT Visit Diagnosis: Other abnormalities of gait and mobility (R26.89);Muscle weakness (generalized) (M62.81);History of falling (Z91.81);Low vision, both eyes (H54.2);Ataxia, unspecified (R27.0);Other symptoms and signs involving cognitive function                Time: 1005-1040 OT Time Calculation (min): 35 min Charges:  OT General Charges $OT Visit: 1 Visit OT Evaluation $OT Eval High Complexity: 1 High OT Treatments $Therapeutic Activity: 8-22 mins  Maurie Boettcher, OT/L   Acute OT Clinical Specialist [place] Pager [phone] Office [phone]   [facility] 04/22/2021, 10:59 AM

## 2021-04-22 NOTE — Evaluation (Signed)
Physical Therapy Evaluation Patient Details Name: Tammy Hernandez MRN: [id number] DOB: 02/07/1956 Today's Date: 04/22/2021   History of Present Illness  65 y.o. woman with progressive ataxia, R sided hearing loss, and myelopathy as well as some dysphagia. 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 REintubated in OR due to somnolence. 5/18 Right frontal ventriculostomy with EVD placement. /19 MRI w/ good resection, using rough measurements 86% of tumor volume, expected diffusion along the resection cavity, small dot at the right inf vs sup colliculus, no obvious perforator strokes. PMH - arthritis; pre-diabetic; osteoporosis.    Clinical Impression  Pt in bed upon arrival of PT, EVD clamped for session. 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 The pt remained mostly lethargic, but was able to maintain short periods of wakefulness and inconsistently followed commands with her LE this session. The pt follows commands more quickly and forcefully with her RLE, but demos increased processing time and weakness with movement in BLE. The pt required totalA with use of bed to change position at this time, but vital signs remained mostly stable with increase in HOB to 45 deg (short bout of tachycardia initially). The pt will continue to benefit from skilled PT to facilitate return of functional strength, mobility, and to increase pt independence, and will likely require CIR level therapies when medically appropriate.     Follow Up Recommendations CIR    Equipment Recommendations   (defer to next venue and continued OOB mobility)    Recommendations for Other Services Rehab consult     Precautions / Restrictions Precautions Precautions: Other (comment) Precaution Comments: ventric; intubated/ET/vent Restrictions Other Position/Activity Restrictions: EVD       Mobility  Bed Mobility Overal bed mobility: Needs Assistance             General bed mobility comments: totalA with use of HOB elevation to change pt position. initially tachy to 110s with elevation to 45 deg, returned to 80-90s eventually. totalA of 2 to reposition    Transfers                 General transfer comment: not appropriate to attmept today     Modified Rankin (Stroke Patients Only) Modified Rankin (Stroke Patients Only) Pre-Morbid Rankin Score: No significant disability Modified Rankin: Severe disability     Balance Overall balance assessment: History of Falls (and ataxia)                                           Pertinent Vitals/Pain Pain Assessment: Faces Faces Pain Scale: No hurt Pain Location: withdraws to noxious stimulation in BLE Pain Intervention(s): Monitored during session    Home Living Family/patient expects to be discharged to:: Private residence Living Arrangements: Spouse/significant other Available Help at Discharge: Available 24 hours/day;Family Type of Home: House Home Access: Stairs to enter Entrance Stairs-Rails: Left Entrance Stairs-Number of Steps: 3 Home Layout: One level Home Equipment: Walker - 2 wheels;Cane - single point;Shower seat (possible shower chair) Additional Comments: Live close to [place]    Prior Function Level of Independence: Independent         Comments: drives; 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  Hand Dominance   Dominant Hand: Right    Extremity/Trunk Assessment   Upper Extremity Assessment Upper Extremity Assessment: Defer to OT evaluation    Lower Extremity Assessment Lower Extremity Assessment: Generalized weakness (PROM WFL bilaterally, pt able to wiggle toes to command and demos slight activation of quad when asked to straighten her leg,  unable to move against gravity, RLE stronger movements than LLE)    Cervical / Trunk Assessment Cervical / Trunk Assessment: Normal  Communication   Communication: HOH (R ear)  Cognition Arousal/Alertness: Lethargic Behavior During Therapy: Flat affect                                   General Comments: Inconsistently following commands with delay (3-6 seconds for RLE, 10+ seconds for LLE). RN reports no sedation, pt unable to maintain attention or alertness without constant cues/stimuli      General Comments General comments (skin integrity, edema, etc.): Pt noted to have significantly warmer R extremities than L extremities this session, RN alerted. Loves to play wiht her cat "Socks"; enjoys baking; gardening    Exercises Other Exercises Other Exercises: BUE PROM through full ROM Other Exercises: BLE PROM across all joints   Assessment/Plan    PT Assessment Patient needs continued PT services  PT Problem List Decreased strength;Decreased range of motion;Decreased activity tolerance;Decreased balance;Decreased coordination;Decreased mobility;Decreased cognition;Decreased knowledge of use of DME;Decreased safety awareness;Cardiopulmonary status limiting activity;Impaired sensation       PT Treatment Interventions DME instruction;Gait training;Stair training;Functional mobility training;Therapeutic activities;Therapeutic exercise;Balance training;Neuromuscular re-education;Patient/family education    PT Goals (Current goals can be found in the Care Plan section)  Acute Rehab PT Goals Patient Stated Goal: per husband for his wife to get better PT Goal Formulation: Patient unable to participate in goal setting Time For Goal Achievement: 05/06/21 Potential to Achieve Goals: Fair    Frequency Min 3X/week   Barriers to discharge        Co-evaluation PT/OT/SLP Co-Evaluation/Treatment: Yes Reason for Co-Treatment: Complexity of the patient's impairments  (multi-system involvement);Necessary to address cognition/behavior during functional activity;To address functional/ADL transfers PT goals addressed during session: Mobility/safety with mobility;Strengthening/ROM OT goals addressed during session: ADL's and self-care       AM-PAC PT "6 Clicks" Mobility  Outcome Measure Help needed turning from your back to your side while in a flat bed without using bedrails?: Total Help needed moving from lying on your back to sitting on the side of a flat bed without using bedrails?: Total Help needed moving to and from a bed to a chair (including a wheelchair)?: Total Help needed standing up from a chair using your arms (e.g., wheelchair or bedside chair)?: Total Help needed to walk in hospital room?: Total Help needed climbing 3-5 steps with a railing? : Total 6 Click Score: 6    End of Session Equipment Utilized During Treatment: Oxygen (pt on vent, FiO2 40%, PEEP of 5) Activity Tolerance: Patient limited by fatigue Patient left: in bed;with bed alarm set;with SCD's reapplied Nurse Communication: Mobility status PT Visit Diagnosis: Other abnormalities of gait and mobility (R26.89);Muscle weakness (generalized) (M62.81)    Time: 4854-6270 PT Time Calculation (min) (ACUTE ONLY): 17 min   Charges:   PT Evaluation $PT Eval Moderate Complexity: 1 Mod          Karma Ganja, PT, DPT   Acute Rehabilitation Department Pager #: [phone]  Otho Bellows 04/22/2021, 11:36 AM

## 2021-04-22 NOTE — Progress Notes (Signed)
175 cc of fentanyl wasted with Leticia Clas, RN.

## 2021-04-23 DIAGNOSIS — J9602 Acute respiratory failure with hypercapnia: Secondary | ICD-10-CM | POA: Diagnosis not present

## 2021-04-23 DIAGNOSIS — Z9889 Other specified postprocedural states: Secondary | ICD-10-CM | POA: Diagnosis not present

## 2021-04-23 DIAGNOSIS — J9601 Acute respiratory failure with hypoxia: Secondary | ICD-10-CM | POA: Diagnosis not present

## 2021-04-23 DIAGNOSIS — D496 Neoplasm of unspecified behavior of brain: Secondary | ICD-10-CM | POA: Diagnosis not present

## 2021-04-23 LAB — BASIC METABOLIC PANEL
Anion gap: 7 (ref 5–15)
BUN: 20 mg/dL (ref 8–23)
CO2: 22 mmol/L (ref 22–32)
Calcium: 8.6 mg/dL — ABNORMAL LOW (ref 8.9–10.3)
Chloride: 105 mmol/L (ref 98–111)
Creatinine, Ser: 0.54 mg/dL (ref 0.44–1.00)
GFR, Estimated: >60 mL/min (ref >60)
Glucose, Bld: 160 mg/dL — ABNORMAL HIGH (ref 70–99)
Potassium: 4.2 mmol/L (ref 3.5–5.1)
Sodium: 134 mmol/L — ABNORMAL LOW (ref 135–145)

## 2021-04-23 LAB — CBC
HCT: 40.7 % (ref 36.0–46.0)
Hemoglobin: 13.4 g/dL (ref 12.0–15.0)
MCH: 30.3 pg (ref 26.0–34.0)
MCHC: 32.9 g/dL (ref 30.0–36.0)
MCV: 92.1 fL (ref 80.0–100.0)
Platelets: 246 10*3/uL (ref 150–400)
RBC: 4.42 MIL/uL (ref 3.87–5.11)
RDW: 12.8 % (ref 11.5–15.5)
WBC: 14.7 10*3/uL — ABNORMAL HIGH (ref 4.0–10.5)
nRBC: 0 % (ref 0.0–0.2)

## 2021-04-23 LAB — MAGNESIUM: Magnesium: 2.3 mg/dL (ref 1.7–2.4)

## 2021-04-23 LAB — GLUCOSE, CAPILLARY
Glucose-Capillary: 155 mg/dL — ABNORMAL HIGH (ref 70–99)
Glucose-Capillary: 184 mg/dL — ABNORMAL HIGH (ref 70–99)
Glucose-Capillary: 163 mg/dL — ABNORMAL HIGH (ref 70–99)
Glucose-Capillary: 202 mg/dL — ABNORMAL HIGH (ref 70–99)
Glucose-Capillary: 227 mg/dL — ABNORMAL HIGH (ref 70–99)
Glucose-Capillary: 198 mg/dL — ABNORMAL HIGH (ref 70–99)

## 2021-04-23 LAB — PHOSPHORUS: Phosphorus: 2 mg/dL — ABNORMAL LOW (ref 2.5–4.6)

## 2021-04-23 MED ORDER — DEXAMETHASONE SODIUM PHOSPHATE 10 MG/ML IJ SOLN
10.0000 mg | Freq: Once | INTRAMUSCULAR | Status: AC
  Administered 2021-04-23: 10 mg via INTRAVENOUS
  Filled 2021-04-23: qty 1

## 2021-04-23 MED ORDER — SODIUM PHOSPHATES 45 MMOLE/15ML IV SOLN
15.0000 mmol | Freq: Once | INTRAVENOUS | Status: AC
  Administered 2021-04-23: 15 mmol via INTRAVENOUS
  Filled 2021-04-23: qty 5

## 2021-04-23 MED ORDER — DEXAMETHASONE SODIUM PHOSPHATE 4 MG/ML IJ SOLN
4.0000 mg | Freq: Four times a day (QID) | INTRAMUSCULAR | Status: AC
  Administered 2021-04-23 – 2021-04-26 (×12): 4 mg via INTRAVENOUS
  Filled 2021-04-23 (×12): qty 1

## 2021-04-23 NOTE — Progress Notes (Signed)
Neurosurgery Service Progress Note  Subjective: No acute events overnight  Objective: Vitals:   04/23/21 0800 04/23/21 0900 04/23/21 1000 04/23/21 1105  BP: (!) 143/89 131/78 134/72   Pulse: (!) 106 (!) 105 (!) 105   Resp: 14 19 (!) 23   Temp: 98.7 F (37.1 C)     TempSrc: Oral     SpO2: 99% 97% 98% 98%  Weight:      Height:        Physical Exam: Intubated, sedation held, eyes open spontaneously, was able to wiggle toes on L to command, open eyes to command repeatedly, eyes OS medial deviation with fast saccade laterally, right without much conjugate movement   Assessment & Plan: 65 y.o. 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 CTH with pneumocephalus, blood products in the resection cavity, improvement in aqueductal stenosis but persistent supratentorial ventriculomegaly, 5/18 s/p R F EVD for persistent depressed mental status. 5/19 MRI w/ good resection, using rough measurements 86% of tumor volume, expected diffusion along the resection cavity, small dot at the right inf vs sup colliculus, no obvious perforator strokes  -given there is residual tumor, will give a pulse of steroids x3d to help with brainstem edema -cont EVD at 5cm H2O, will raise EVD to 10 tomorrow if she continues to do well -CCM recs for vent management -R eye closure is only complete with effort, likely HB3, will need to use lubricating gtt and tape shut for corneal protection - likely will help w/ diplopia too while she's awake -SCDs/TEDs, okay for [facility]  Tammy Hernandez  04/23/21 11:14 AM

## 2021-04-23 NOTE — Progress Notes (Signed)
Pharmacy Electrolyte Replacement  Recent Labs:  Recent Labs    04/23/21 0648  K 4.2  MG 2.3  PHOS 2.0*  CREATININE 0.54    Low Critical Values (K </= 2.5, Phos </= 1, Mg </= 1) Present: None   Plan:  - Phos 2 - give NaPhos 15 mmol x 1 - F/u Phos with AM labs  Alycia Rossetti, PharmD, BCPS  11:28 AM

## 2021-04-23 NOTE — Progress Notes (Signed)
NAME:  Tammy Hernandez MRN:  [id number] DOB:  09-28-1956 LOS: 3 ADMISSION DATE:  04/12/2021 CONSULTATION DATE:  04/22/2021 REFERRING MD:  Zada Finders CHIEF COMPLAINT:  Acute respiratory failure s/p craniotomy  History of Present Illness:  65 year old female with PMHx significant for T2DM, HLD, hypothyroidism and recent diagnosis of petroclival meningoma as evidenced by ataxia, R-sided hearing loss, myelopathy and dysphagia who presented to [facility] 5/17 for R retrosigmoid craniotomy for resection of meningioma.   Patient tolerated procedure well and was extubated in the OR; unfortunately, remained too somnolent to protect her airway and was subsequently reintubated. PCCM consulted for vent management.  Significant Hospital Events: Including procedures, antibiotic start and stop dates in addition to other pertinent events   . 5/17 - POD#0 R craniotomy for petroclival meningioma resection. Extubated in OR, too somnolent to protect her airway, reintubated. Admit to Neuro ICU post-op. . 04/21/2021 interventricular drain to be placed by neurosurgery . 04/21/2021 noted MRI . 04/21/2021 arterial line discontinued  Interim History / Subjective:  Remain on mechanical ventilator, off sedation Afebrile No overnight issues  Objective:  Blood pressure 117/72, pulse (!) 107, temperature 98.5 F (36.9 C), temperature source Oral, resp. rate 20, height 5' 3" (1.6 m), weight 74.5 kg, SpO2 98 %.    Vent Mode: PSV;CPAP FiO2 (%):  [40 %] 40 % Set Rate:  [16 bmp] 16 bmp Vt Set:  [420 mL] 420 mL PEEP:  [5 cmH20] 5 cmH20 Pressure Support:  [10 cmH20] 10 cmH20 Plateau Pressure:  [14 cmH20-17 cmH20] 14 cmH20   Intake/Output Summary (Last 24 hours) at 04/23/2021 0758 Last data filed at 04/23/2021 0700 Gross per 24 hour  Intake 996 ml  Output 1032 ml  Net -36 ml   Filed Weights   05/04/2021 0542 04/22/21 0500 04/23/21 0400  Weight: 71.7 kg 70.6 kg 74.5 kg   Physical Examination:   Physical exam: General:  Critically ill-appearing female, orally intubated HEENT: Status post EVD.  ETT and OGT in place.  Moist mucous membranes Neuro: Opens eyes spontaneously with disconjugate gaze, pupils are reactive bilaterally with positive cough.  Withdrawing in bilateral lower extremities, no movement on upper extremity to painful stimuli Chest: Coarse breath sounds, no wheezes or rhonchi Heart: Tachycardic, regular rhythm, no murmurs or gallops Abdomen: Soft, nontender, nondistended, bowel sounds present Skin: No rash   Labs/imaging that I have personally reviewed: (right click and "Reselect all SmartList Selections" daily)  04/21/2021 no chest x-ray or lab data was available at this time.  Have ordered chest x-ray CBC be met appropriate interactions will be taken based on those results  MRI brain 5/18: Postsurgical changes from subtotal resection of large right petroclival meningioma with residual tumor measuring approximately 2.3 x 1.5 cm. 2. Persistent prominent edema involving the brainstem and extending to the thalamus and lentiform nucleus resulting in narrowing of the cerebral aqueduct with supratentorial hydrocephalus, stable since prior postoperative CT. 3. Right frontal approach ventricular drain is well positioned with the tip in the frontal horn of the right lateral ventricle  Resolved Hospital Problem List:   Acute respiratory acidosis Hypokalemia Hypophosphatemia  Assessment & Plan:   Acute respiratory failure with hypoxia and hypercapnia requiring mechanical ventilation Patient was reintubated postprocedure as she was somnolent and unable to protect her airway, also noted to have hypercapnia Continue lung protective ventilation VAP bundle She is tolerating pressure support, watch for respiratory distress but unable to extubate due to her poor mental status  Meningioma s/p R craniotomy with retrosigmoid resection of  petroclival meningioma Obstructive hydrocephalus s/p EVD Management per  neurosurgery EVD in place at 5 cmH2O  Hypertension Labetalol has been ordered with a goal to keep systolic less than 370 Closely monitor  Diabetes mellitus type 2 Hold home metformin Continue sliding scale insulin  Hyponatremia Serum sodium at 134, closely monitor   Best Practice: (right click and "Reselect all SmartList Selections" daily)   Per primary team     Total critical care time: 32 minutes  Performed by: Brent care time was exclusive of separately billable procedures and treating other patients.   Critical care was necessary to treat or prevent imminent or life-threatening deterioration.   Critical care was time spent personally by me on the following activities: development of treatment plan with patient and/or surrogate as well as nursing, discussions with consultants, evaluation of patient's response to treatment, examination of patient, obtaining history from patient or surrogate, ordering and performing treatments and interventions, ordering and review of laboratory studies, ordering and review of radiographic studies, pulse oximetry and re-evaluation of patient's condition.   Jacky Kindle MD [facility] See Amion for pager If no response to pager, please call [phone] until 7pm After 7pm, Please call E-link [phone]

## 2021-04-24 DIAGNOSIS — Z9889 Other specified postprocedural states: Secondary | ICD-10-CM | POA: Diagnosis not present

## 2021-04-24 DIAGNOSIS — Z789 Other specified health status: Secondary | ICD-10-CM | POA: Diagnosis not present

## 2021-04-24 DIAGNOSIS — D496 Neoplasm of unspecified behavior of brain: Secondary | ICD-10-CM | POA: Diagnosis not present

## 2021-04-24 LAB — BASIC METABOLIC PANEL
Anion gap: 9 (ref 5–15)
BUN: 23 mg/dL (ref 8–23)
CO2: 25 mmol/L (ref 22–32)
Calcium: 8.9 mg/dL (ref 8.9–10.3)
Chloride: 102 mmol/L (ref 98–111)
Creatinine, Ser: 0.55 mg/dL (ref 0.44–1.00)
GFR, Estimated: >60 mL/min (ref >60)
Glucose, Bld: 202 mg/dL — ABNORMAL HIGH (ref 70–99)
Potassium: 4.6 mmol/L (ref 3.5–5.1)
Sodium: 136 mmol/L (ref 135–145)

## 2021-04-24 LAB — GLUCOSE, CAPILLARY
Glucose-Capillary: 185 mg/dL — ABNORMAL HIGH (ref 70–99)
Glucose-Capillary: 186 mg/dL — ABNORMAL HIGH (ref 70–99)
Glucose-Capillary: 175 mg/dL — ABNORMAL HIGH (ref 70–99)
Glucose-Capillary: 180 mg/dL — ABNORMAL HIGH (ref 70–99)
Glucose-Capillary: 175 mg/dL — ABNORMAL HIGH (ref 70–99)
Glucose-Capillary: 159 mg/dL — ABNORMAL HIGH (ref 70–99)

## 2021-04-24 LAB — PHOSPHORUS: Phosphorus: 1.7 mg/dL — ABNORMAL LOW (ref 2.5–4.6)

## 2021-04-24 MED ORDER — DEXMEDETOMIDINE HCL IN NACL 400 MCG/100ML IV SOLN: 0.4000 ug/kg/h | INTRAVENOUS | Status: DC

## 2021-04-24 MED ORDER — K PHOS MONO-SOD PHOS DI & MONO 155-852-130 MG PO TABS
500.0000 mg | ORAL_TABLET | Freq: Four times a day (QID) | ORAL | Status: AC
  Administered 2021-04-24 – 2021-04-25 (×5): 500 mg
  Filled 2021-04-24 (×5): qty 2

## 2021-04-24 MED ORDER — INSULIN ASPART 100 UNIT/ML IJ SOLN
3.0000 [IU] | INTRAMUSCULAR | Status: DC
  Administered 2021-04-24 – 2021-05-05 (×66): 3 [IU] via SUBCUTANEOUS

## 2021-04-24 MED ORDER — K PHOS MONO-SOD PHOS DI & MONO 155-852-130 MG PO TABS
500.0000 mg | ORAL_TABLET | Freq: Four times a day (QID) | ORAL | Status: DC
  Administered 2021-04-24 (×3): 500 mg via ORAL
  Filled 2021-04-24 (×4): qty 2

## 2021-04-24 NOTE — Progress Notes (Signed)
Neurosurgery Service Progress Note  Subjective: No acute events overnight, continued slow improvement in exam, wiggling toes b/l for nursing overnight  Objective: Vitals:   04/24/21 0800 04/24/21 0815 04/24/21 0900 04/24/21 1000  BP: 120/75 120/75 128/70 128/70  Pulse: 96 95 95 97  Resp: 17 17 15 17   Temp: 98.7 F (37.1 C)     TempSrc: Axillary     SpO2: 98% 98% 98% 98%  Weight:      Height:        Physical Exam: Intubated, sedation held, eyes open spontaneously, reliably wiggles toes to command bilaterally, R eye taped shut, L eye PERRL w/ full ROM, no movement BUE   Assessment & Plan: 65 y.o. 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 CTH with pneumocephalus, blood products in the resection cavity, improvement in aqueductal stenosis but persistent supratentorial ventriculomegaly, 5/18 s/p R F EVD for persistent depressed mental status. 5/19 MRI w/ good resection, using rough measurements 86% of tumor volume, expected diffusion along the resection cavity, small dot at the right inf vs sup colliculus, no obvious perforator strokes  -continue dex, tentative stop date 5/22 -EVD to +10, okay to drop back down to +5cmH2O if she is less responsive or stops following commands -CCM recs for vent management -BUE likely just need time, theoretically could have cruciate paralysis given that the caudal pole of the tumor was at the CV junction but that is exceedingly rare -R eye closure is only complete with effort, likely HB3, will need to use lubricating gtt and tape shut for corneal protection - likely will help w/ diplopia too while she's awake -SCDs/TEDs, okay for [facility]  Marcello Moores A Cort Dragoo  04/24/21 10:12 AM

## 2021-04-24 NOTE — Progress Notes (Signed)
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   Patient tolerated procedure well and was extubated in the OR; unfortunately, remained too somnolent to protect her airway and was subsequently reintubated. PCCM consulted for vent management.  Significant Hospital Events: Including procedures, antibiotic start and stop dates in addition to other pertinent events   . 5/17 - POD#0 R craniotomy for petroclival meningioma resection. Extubated in OR, too somnolent to protect her airway, reintubated. Admit to Neuro ICU post-op. . 04/21/2021 interventricular drain to be placed by neurosurgery . 04/21/2021 noted MRI . 04/21/2021 arterial line discontinued  Interim History / Subjective:  No events. Able to follow some commands intermittently.  Objective:  Blood pressure 128/70, pulse 95, temperature 98.7 F (37.1 C), temperature source Axillary, resp. rate 15, height 5\' 3"  (1.6 m), weight 74.3 kg, SpO2 98 %.    Vent Mode: CPAP;PSV FiO2 (%):  [40 %] 40 % Set Rate:  [16 bmp] 16 bmp Vt Set:  [420 mL] 420 mL PEEP:  [5 cmH20] 5 cmH20 Pressure Support:  [10 cmH20] 10 cmH20 Plateau Pressure:  [15 cmH20] 15 cmH20   Intake/Output Summary (Last 24 hours) at 04/24/2021 0949 Last data filed at 04/24/2021 0600 Gross per 24 hour  Intake 1355.24 ml  Output 1856 ml  Net -500.76 ml   Filed Weights   04/22/21 0500 04/23/21 0400 04/24/21 0400  Weight: 70.6 kg 74.5 kg 74.3 kg   Physical Examination: Constitutional: ill appearing woman in NAD  Eyes: L  pupil reactive, R eye dressed, able to track with left Ears, nose, mouth, and throat: ETT in place, minimal secretions, EVD in place with minimal output Cardiovascular: Tachycardic, ext warm Respiratory: Clear, triggering vent Gastrointestinal: soft, +BS Skin: No rashes, normal turgor Neurologic: can get her to wiggle toes to command, cannot get anything with arms Psychiatric: RASS 0    Labs/imaging that I have personally reviewed: (right click and "Reselect all SmartList Selections" daily)  Sugars up Phos low  Resolved Hospital Problem List:    Assessment & Plan:   Acute respiratory failure with hypoxia and hypercapnia requiring mechanical ventilation- due to inadequate respiratory drive from postop brainstem swelling Meningioma s/p R craniotomy with retrosigmoid resection of petroclival meningioma Obstructive hydrocephalus s/p EVD - Vent support, VAP prevention bundle - Needs to be a bit more awake to consider extubation - Sedation can be precedex PRN - Management of EVD, surgical site, steroids, and further head imaging per NSGY: EVD in place at 5 cmH2O, potentially to raise to 10cmH2O today  Hypertension -Labetalol has been ordered with a goal to keep systolic less than 016 Closely monitor  Diabetes mellitus type 2 with hyperglycemia - Start standing aspart and SSI, should get better as we ease off steroids  Hyponatremia- mild, stable, monitor  Updated husband at bedside  Best Practice: (right click and "Reselect all SmartList Selections" daily)   Per primary team  Patient critically ill due to respiratory failure Interventions to address this today vent titration Risk of deterioration without these interventions is high  I personally spent 36 minutes providing critical care not including any  separately billable procedures  Erskine Emery MD [facility]  Prefer epic messenger for cross cover needs If after hours, please call E-link

## 2021-04-25 DIAGNOSIS — J9601 Acute respiratory failure with hypoxia: Secondary | ICD-10-CM

## 2021-04-25 DIAGNOSIS — D496 Neoplasm of unspecified behavior of brain: Secondary | ICD-10-CM | POA: Diagnosis not present

## 2021-04-25 LAB — BASIC METABOLIC PANEL
Anion gap: 9 (ref 5–15)
BUN: 35 mg/dL — ABNORMAL HIGH (ref 8–23)
CO2: 29 mmol/L (ref 22–32)
Calcium: 8.6 mg/dL — ABNORMAL LOW (ref 8.9–10.3)
Chloride: 102 mmol/L (ref 98–111)
Creatinine, Ser: 0.61 mg/dL (ref 0.44–1.00)
GFR, Estimated: >60 mL/min (ref >60)
Glucose, Bld: 194 mg/dL — ABNORMAL HIGH (ref 70–99)
Potassium: 4 mmol/L (ref 3.5–5.1)
Sodium: 140 mmol/L (ref 135–145)

## 2021-04-25 LAB — GLUCOSE, CAPILLARY
Glucose-Capillary: 181 mg/dL — ABNORMAL HIGH (ref 70–99)
Glucose-Capillary: 183 mg/dL — ABNORMAL HIGH (ref 70–99)
Glucose-Capillary: 155 mg/dL — ABNORMAL HIGH (ref 70–99)
Glucose-Capillary: 180 mg/dL — ABNORMAL HIGH (ref 70–99)
Glucose-Capillary: 168 mg/dL — ABNORMAL HIGH (ref 70–99)
Glucose-Capillary: 200 mg/dL — ABNORMAL HIGH (ref 70–99)

## 2021-04-25 LAB — PHOSPHORUS: Phosphorus: 2.7 mg/dL (ref 2.5–4.6)

## 2021-04-25 LAB — MAGNESIUM: Magnesium: 2.5 mg/dL — ABNORMAL HIGH (ref 1.7–2.4)

## 2021-04-25 MED ORDER — CHLORHEXIDINE GLUCONATE 0.12 % MT SOLN
OROMUCOSAL | Status: AC
  Filled 2021-04-25: qty 15

## 2021-04-25 NOTE — Progress Notes (Signed)
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   Patient tolerated procedure well and was extubated in the OR; unfortunately, remained too somnolent to protect her airway and was subsequently reintubated. PCCM consulted for vent management.  Significant Hospital Events: Including procedures, antibiotic start and stop dates in addition to other pertinent events   . 5/17 - POD#0 R craniotomy for petroclival meningioma resection. Extubated in OR, too somnolent to protect her airway, reintubated. Admit to Neuro ICU post-op. . 04/21/2021 interventricular drain to be placed by neurosurgery . 04/21/2021 noted MRI . 04/21/2021 arterial line discontinued  Interim History / Subjective:  More awake today. Husband at bedside. EVD may be clogged.  Objective:  Blood pressure 123/70, pulse (!) 104, temperature 99 F (37.2 C), temperature source Oral, resp. rate 18, height 5\' 3"  (1.6 m), weight 71.5 kg, SpO2 97 %.    Vent Mode: PRVC FiO2 (%):  [40 %] 40 % Set Rate:  [16 bmp] 16 bmp Vt Set:  [420 mL] 420 mL PEEP:  [5 cmH20] 5 cmH20 Pressure Support:  [10 cmH20] 10 cmH20 Plateau Pressure:  [14 cmH20] 14 cmH20   Intake/Output Summary (Last 24 hours) at 04/25/2021 0932 Last data filed at 04/25/2021 0600 Gross per 24 hour  Intake 1200 ml  Output 1403 ml  Net -203 ml   Filed Weights   04/23/21 0400 04/24/21 0400 04/25/21 0400  Weight: 74.5 kg 74.3 kg 71.5 kg   Physical Examination: Constitutional: no acute distress laying in bed  Eyes: R eye  dressed,L eye with R gaze preference, reactive, able to track, EVD in place with some bloody output Ears, nose, mouth, and throat: ETT in place, minimal secretions Cardiovascular: tachycardic, ext warm Respiratory: clear, triggering vent Gastrointestinal: soft, +BS Skin: No rashes, normal turgor Neurologic: able to move lowers briskly to command, still no movement in uppers, cannot lift head up against gravity Psychiatric: RASS 0    Labs/imaging that I have personally reviewed: (right click and "Reselect all SmartList Selections" daily)  BMP/Mg/Phos look good  Resolved Hospital Problem List:   Hyponatremia- mild, stable, monitor  Assessment & Plan:   Acute respiratory failure with hypoxia and hypercapnia requiring mechanical ventilation- due to inadequate respiratory drive from postop brainstem swelling Meningioma s/p R craniotomy with retrosigmoid resection of petroclival meningioma Obstructive hydrocephalus s/p EVD - Vent support, VAP prevention bundle - Needs more strength from my standpoint to consider extubation; hopefully can avoid trach - Sedation can be precedex PRN - Management of EVD, surgical site, steroids, and further head imaging per NSGY  Hypertension -Labetalol has been ordered with a goal to keep systolic less than 671 Closely monitor  Diabetes mellitus type 2 with hyperglycemia - Better controlled with standing aspart and SSI, should get better as we ease off steroids  Updated husband at bedside  Best Practice: (right click and "Reselect all SmartList Selections" daily)   Per primary team  Patient critically ill due to respiratory failure Interventions to address this today vent titration Risk of deterioration without these interventions is high  I personally spent 34 minutes providing critical care not  including any separately billable procedures  Erskine Emery MD [facility]  Prefer epic messenger for cross cover needs If after  hours, please call E-link

## 2021-04-25 NOTE — Progress Notes (Signed)
Neurosurgery Service Progress Note  Subjective: No acute events overnight, drain clogged early this morning but pt still reliably following commands since then  Objective: Vitals:   04/25/21 1400 04/25/21 1500 04/25/21 1508 04/25/21 1600  BP: (!) 150/80 134/85 134/85 (!) 143/81  Pulse: (!) 118 (!) 121 (!) 126 (!) 116  Resp: (!) 22 (!) 23 (!) 22 16  Temp:      TempSrc:      SpO2: 95% 95% 95% 95%  Weight:      Height:        Physical Exam: Intubated, sedation held, eyes open spontaneously, reliably wiggles toes to command bilaterally, R eye taped shut, L eye PERRL w/ full ROM, no movement BUE   Assessment & Plan: 66 y.o. 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 CTH with pneumocephalus, blood products in the resection cavity, improvement in aqueductal stenosis but persistent supratentorial ventriculomegaly, 5/18 s/p R F EVD for persistent depressed mental status. 5/19 MRI w/ good resection, using rough measurements 86% of tumor volume, expected diffusion along the resection cavity, small dot at the right inf vs sup colliculus, no obvious perforator strokes  -continue dex, tentative stop date 5/22 -EVD flushed and working, keep open at +10cm H2O. If it occludes again, we can watch her clinically. If she continues to follow commands, no need for change in plan, will effectively become a clamp trial.  -CCM recs for vent management -R eye closure is only complete with effort, likely HB3, will need to use lubricating gtt and tape shut for corneal protection - likely will help w/ diplopia too while she's awake -SCDs/TEDs, SQH  Judith Part  04/25/21 4:17 PM

## 2021-04-26 ENCOUNTER — Inpatient Hospital Stay (HOSPITAL_COMMUNITY): Payer: 59

## 2021-04-26 DIAGNOSIS — J9601 Acute respiratory failure with hypoxia: Secondary | ICD-10-CM | POA: Diagnosis not present

## 2021-04-26 LAB — GLUCOSE, CAPILLARY
Glucose-Capillary: 184 mg/dL — ABNORMAL HIGH (ref 70–99)
Glucose-Capillary: 191 mg/dL — ABNORMAL HIGH (ref 70–99)
Glucose-Capillary: 202 mg/dL — ABNORMAL HIGH (ref 70–99)
Glucose-Capillary: 184 mg/dL — ABNORMAL HIGH (ref 70–99)
Glucose-Capillary: 176 mg/dL — ABNORMAL HIGH (ref 70–99)
Glucose-Capillary: 188 mg/dL — ABNORMAL HIGH (ref 70–99)

## 2021-04-26 LAB — BASIC METABOLIC PANEL
Anion gap: 9 (ref 5–15)
BUN: 42 mg/dL — ABNORMAL HIGH (ref 8–23)
CO2: 30 mmol/L (ref 22–32)
Calcium: 8.5 mg/dL — ABNORMAL LOW (ref 8.9–10.3)
Chloride: 103 mmol/L (ref 98–111)
Creatinine, Ser: 0.62 mg/dL (ref 0.44–1.00)
GFR, Estimated: >60 mL/min (ref >60)
Glucose, Bld: 199 mg/dL — ABNORMAL HIGH (ref 70–99)
Potassium: 4.3 mmol/L (ref 3.5–5.1)
Sodium: 142 mmol/L (ref 135–145)

## 2021-04-26 LAB — MAGNESIUM: Magnesium: 2.5 mg/dL — ABNORMAL HIGH (ref 1.7–2.4)

## 2021-04-26 LAB — PHOSPHORUS: Phosphorus: 3.6 mg/dL (ref 2.5–4.6)

## 2021-04-26 IMAGING — CT CT HEAD W/O CM
4 series · 15 of 47 positions shown, 17 images · non-contrast
Comparison: Brain MRI [DATE] and earlier.

CLINICAL DATA: 64-year-old female with history of petrous clival
meningioma resection. Lower brainstem hemorrhage. Brainstem edema.
Ventriculomegaly and EVD placed. Subsequent encounter.

EXAM:
CT HEAD WITHOUT CONTRAST
TECHNIQUE: Contiguous axial images were obtained from the base of the skull
through the vertex without intravenous contrast.

[Series 3: head without · axial · non-contrast · 0.43mm/px · z∈[+6,+126]mm · 7 of 33 slices shown, 9 images]
[im 5/33  brain]
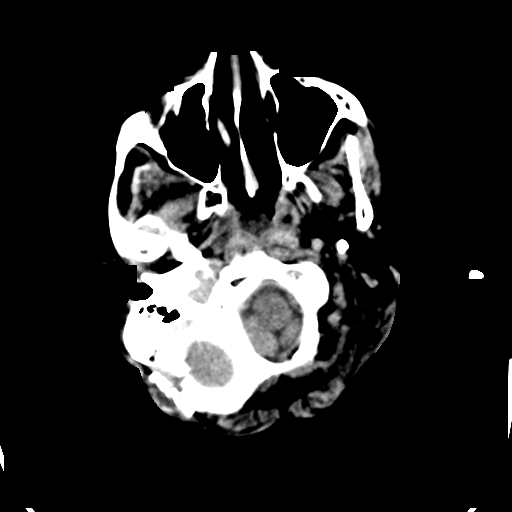
[im 5/33  bone]
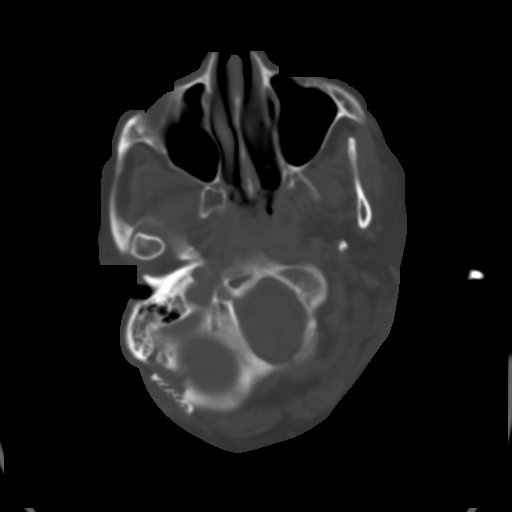
[im 9/33  brain]
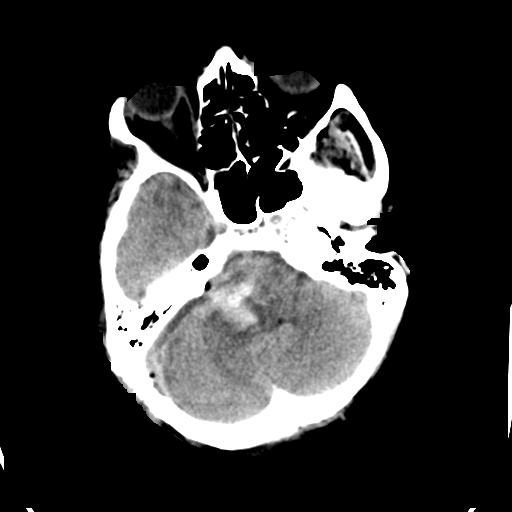
[im 13/33  brain]
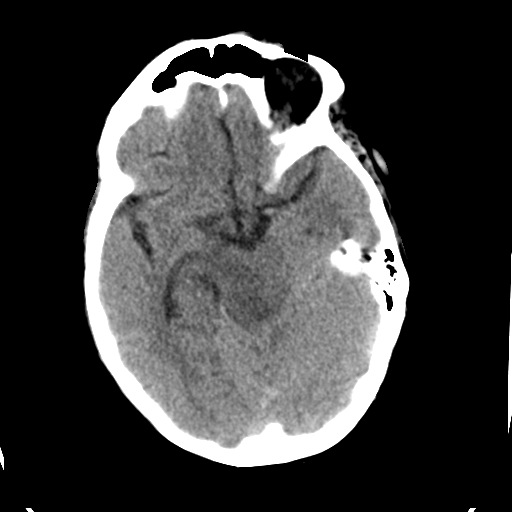
[im 17/33  brain]
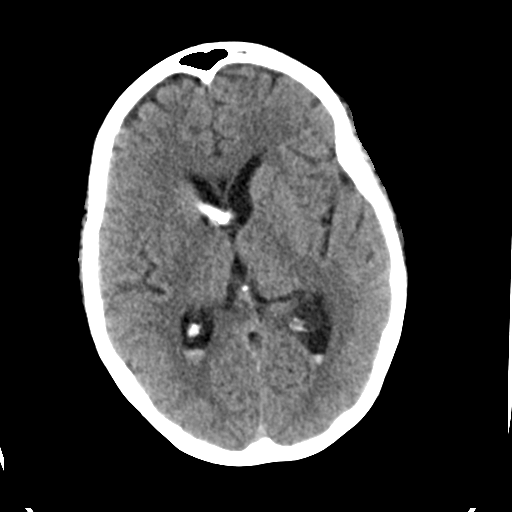
[im 21/33  brain]
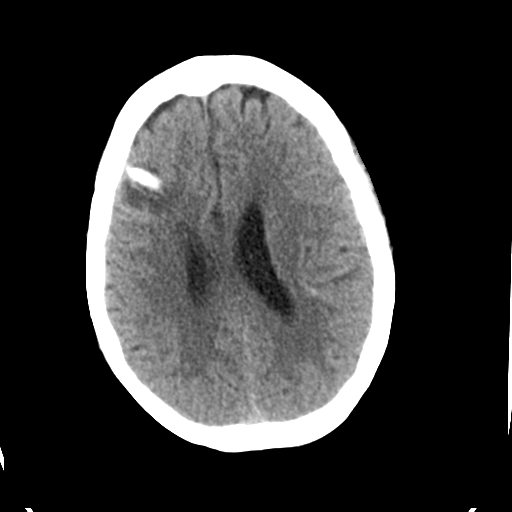
[im 21/33  bone]
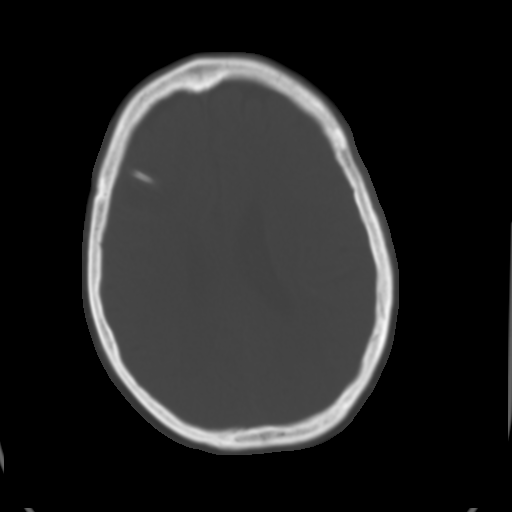
[im 25/33  brain]
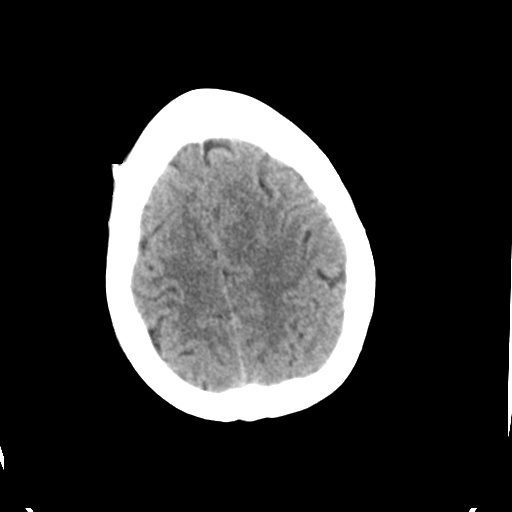
[im 29/33  brain]
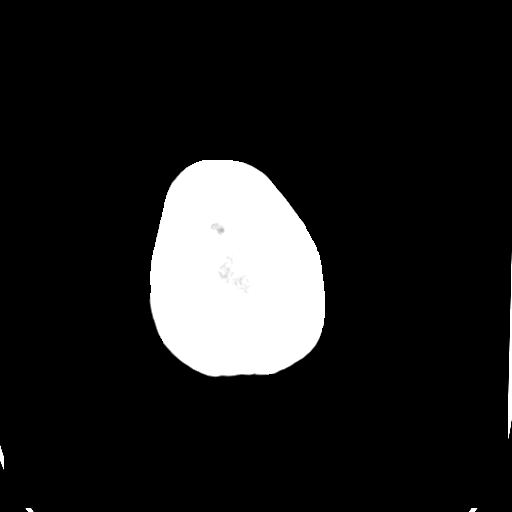

[Series 4: head bone · axial · 0.43mm/px · z∈[+2,+18]mm · 2 of 83 slices shown]
[im 9/83  bone]
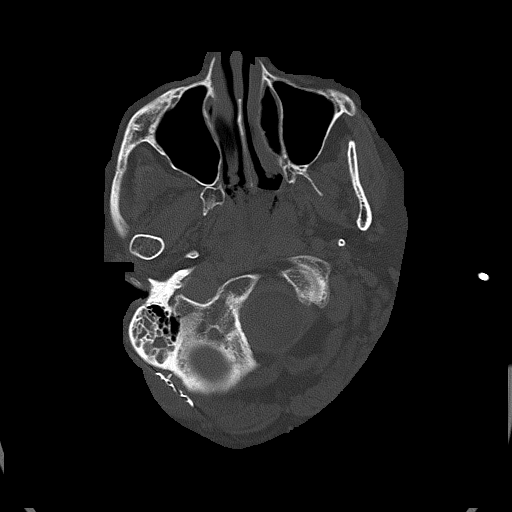
[im 17/83  bone]
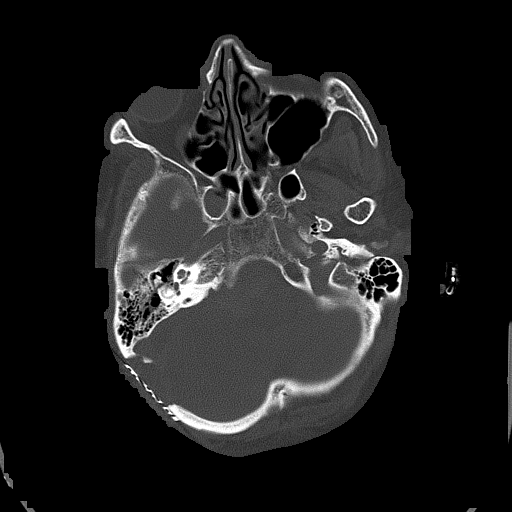

[Series 5: head without cor · coronal · non-contrast · 0.32mm/px · 3 of 68 slices shown]
[im 23/68  brain]
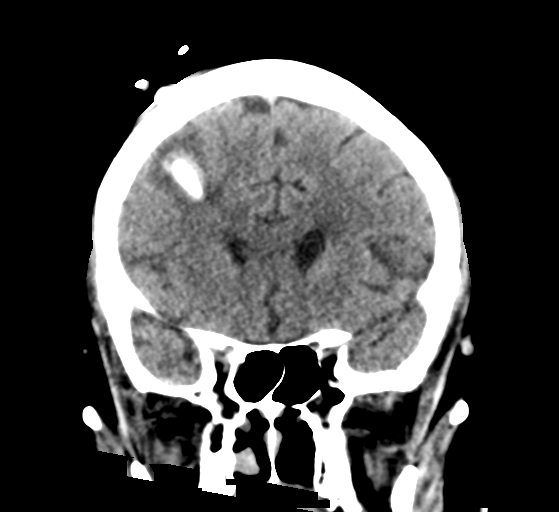
[im 30/68  brain]
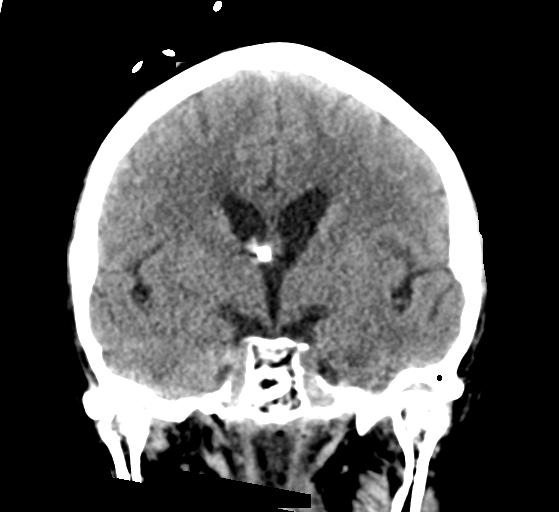
[im 38/68  brain]
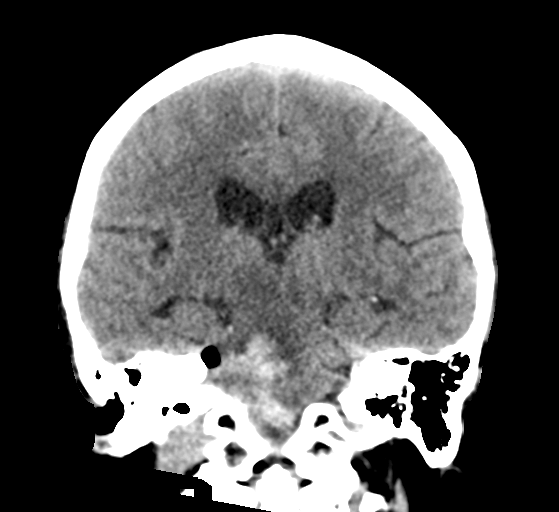

[Series 6: head without sag · sagittal · non-contrast · 0.32mm/px · 3 of 66 slices shown]
[im 24/66  brain]
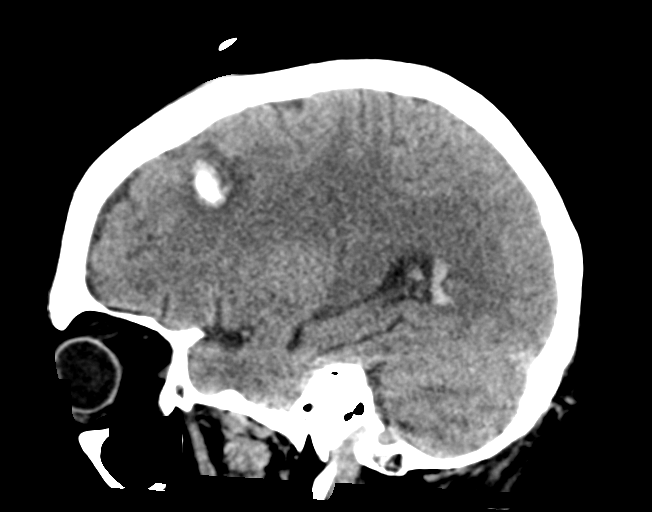
[im 33/66  brain]
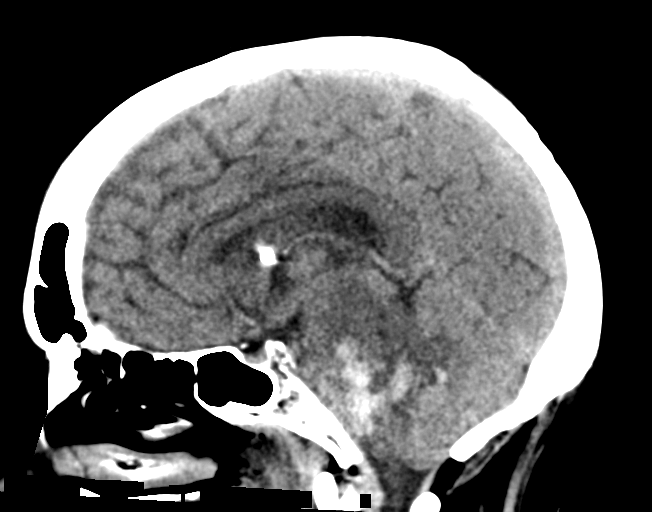
[im 42/66  brain]
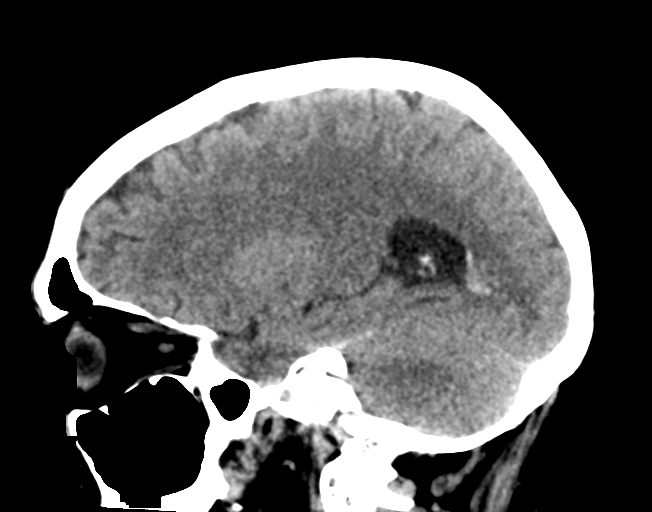

[15 of 47 positions shown; findings below may reference images not displayed]

FINDINGS: Brain: Largely resolved pneumocephalus since [DATE], trace
residual at the right cerebellopontine angle.

Right frontal approach EVD now in place and communicates with the
right lateral ventricle on coronal image 28. There is a trace amount
of hemorrhage and mild edema along the course of the catheter. Small
volume new intraventricular hemorrhage, but resolved
ventriculomegaly since [DATE].

Heterogeneous hemorrhage and edema in the right brainstem
encompassing 3.4 cm craniocaudal does not appear significantly
changed since [DATE] (see sagittal image 33 today and compare to
series 5, image 25 on [REDACTED]. A minority of this hemorrhage
appears to be extra-axial as before. Mass effect on the 4th
ventricle has not significantly changed. Cerebellum gray-white
matter differentiation appears stable. Small volume of low-density
extra-axial fluid underlying the craniectomy.

Suprasellar cistern has improved. No supratentorial midline shift or
acute cortically based infarct.

Vascular: Calcified atherosclerosis at the skull base.

Skull: Right suboccipital craniectomy, cranioplasty. Stable
visualized osseous structures.

Sinuses/Orbits: Mild fluid and mucosal thickening in the right
sphenoid sinus. Right mastoid effusion has increased since
[DATE]. Tympanic cavities remain clear.

Other: Intubated on the scout view. Fluid in the visible pharynx.
Postoperative changes to the scalp. Stable and negative orbits.
IMPRESSION: 1. Right frontal approach EVD placed with resolved ventriculomegaly
since [DATE]. Trace hemorrhage and mild edema along the course
of the catheter, and small volume new IVH.
2. Brainstem hemorrhage and edema not significantly changed since
[DATE]. Decreased pneumocephalus. Small volume of postoperative
extra-axial fluid/blood at the cisterna magna and along the right
lower cerebellum.
3. No new intracranial abnormality.
4. Right suboccipital craniectomy abuts the right mastoid where a
fusion has increased since [DATE].

## 2021-04-26 IMAGING — DX DG CHEST 1V PORT
1 series · 1 of 1 positions shown · non-contrast
Comparison: Portable chest [DATE] and earlier.

CLINICAL DATA: 64-year-old female intubated. Status post skull base
meningioma resection with brainstem hemorrhage.

EXAM:
PORTABLE CHEST 1 VIEW

[chest ap]
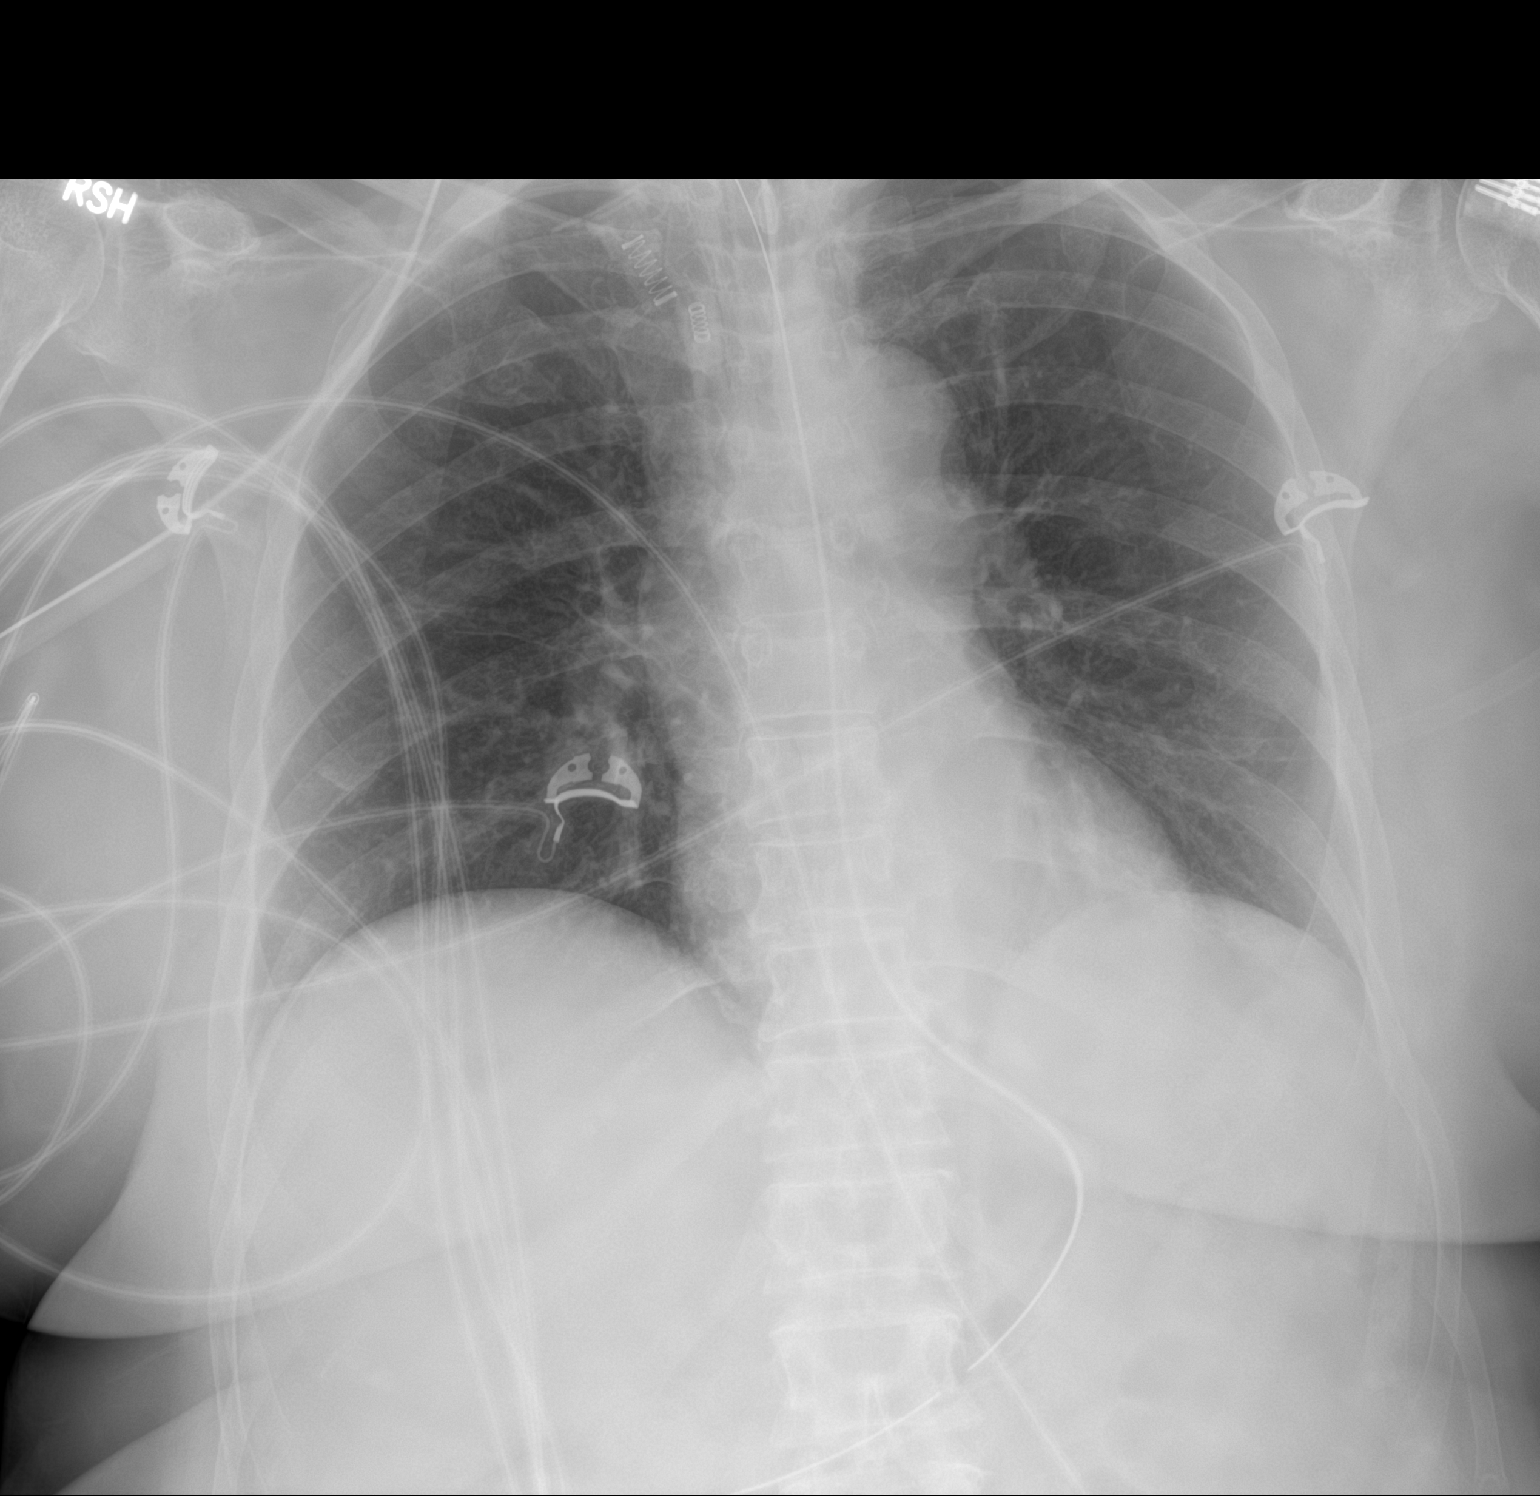

[1 of 1 positions shown; findings below may reference images not displayed]

FINDINGS: Portable AP semi upright view at [KP] hours. Stable lines and tubes.
Low normal lung volumes. Normal cardiac size and mediastinal
contours. Allowing for portable technique the lungs are clear. No
pneumothorax or pleural effusion. Paucity of bowel gas in the upper
abdomen. No acute osseous abnormality identified.
IMPRESSION: 1. Stable lines and tubes.
2. No acute cardiopulmonary abnormality.

## 2021-04-26 NOTE — Progress Notes (Signed)
Physical Therapy Treatment Patient Details Name: Tammy Hernandez MRN: [id number] DOB: 03-31-1956 Today's Date: 04/26/2021    History of Present Illness 65 y.o. woman with progressive ataxia, R sided hearing loss, and myelopathy as well as some dysphagia. 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 REintubated in OR due to somnolence. 5/18 Right frontal ventriculostomy with EVD placement. /19 MRI w/ good resection, using rough measurements 86% of tumor volume, expected diffusion along the resection cavity, small dot at the right inf vs sup colliculus, no obvious perforator strokes. Marland Kitchen EVD clotted on 5/22. PMH - arthritis; pre-diabetic; osteoporosis.    PT Comments    The pt was seen by PT/OT in conjunction to allow for safe progression of bed mobility at this time. She remains intubated and inconsistently following commands for BLEs with delay (3-6 seconds for RLE, 10+ seconds for LLE). The pt was able to tolerate sitting EOB with total posterior support for 10-12 min, and needed support to maintain upright head position (pt prefers flexion with head resting on chest). The pt was able to follow simple commands with BLE ("wiggle your toes") but no active command following with BUE or visual tracking noted at this time. Will continue to follow and progress mobility as tolerated, continue to recommend post-acute therapies to maximize functional recovery.    Follow Up Recommendations  CIR     Equipment Recommendations  None recommended by PT (defer to next venue and continued OOB mobility)    Recommendations for Other Services       Precautions / Restrictions Precautions Precautions: Other (comment) Precaution Comments: ventric; intubated/ET/vent;EVD clotted Restrictions Weight Bearing Restrictions: No Other Position/Activity Restrictions: EVD; clotted    Mobility  Bed Mobility Overal bed mobility: Needs Assistance Bed Mobility: Supine to Sit;Sit to Supine      Supine to sit: Total assist;+2 for physical assistance Sit to supine: Total assist;+2 for physical assistance   General bed mobility comments: Total A for bed mobility to sit at EOB. Pt slight HR increased for sitting EOB from 110 to 120s.    Transfers                 General transfer comment: Defered for safety     Modified Rankin (Stroke Patients Only) Modified Rankin (Stroke Patients Only) Pre-Morbid Rankin Score: No significant disability Modified Rankin: Severe disability     Balance Overall balance assessment: History of Falls (and ataxia)                                          Cognition Arousal/Alertness: Lethargic Behavior During Therapy: Flat affect                                   General Comments: Inconsistently following commands for movement of BLEs with delay (3-6 seconds for RLE, 10+ seconds for LLE).      Exercises Other Exercises Other Exercises: BUE PROM through full ROM Other Exercises: BLE PROM across all joints    General Comments General comments (skin integrity, edema, etc.): VSS on vent. Vent on 60% O2 conc and PEEP 5. HR elevating to 120s with bed mobility.      Pertinent Vitals/Pain Pain Assessment: Faces Faces Pain Scale: No hurt Pain Location: withdraws to noxious stimulation in BLE Pain Intervention(s): Monitored during session  PT Goals (current goals can now be found in the care plan section) Acute Rehab PT Goals Patient Stated Goal: per husband for his wife to get better PT Goal Formulation: Patient unable to participate in goal setting Time For Goal Achievement: 05/06/21 Potential to Achieve Goals: Fair Progress towards PT goals: Not progressing toward goals - comment (remains lethargic)    Frequency    Min 3X/week      PT Plan Current plan remains appropriate    Co-evaluation PT/OT/SLP Co-Evaluation/Treatment: Yes Reason for Co-Treatment: Necessary to address  cognition/behavior during functional activity;For patient/therapist safety;To address functional/ADL transfers PT goals addressed during session: Balance;Strengthening/ROM;Mobility/safety with mobility        AM-PAC PT "6 Clicks" Mobility   Outcome Measure  Help needed turning from your back to your side while in a flat bed without using bedrails?: Total Help needed moving from lying on your back to sitting on the side of a flat bed without using bedrails?: Total Help needed moving to and from a bed to a chair (including a wheelchair)?: Total Help needed standing up from a chair using your arms (e.g., wheelchair or bedside chair)?: Total Help needed to walk in hospital room?: Total Help needed climbing 3-5 steps with a railing? : Total 6 Click Score: 6    End of Session Equipment Utilized During Treatment: Oxygen (pt on vent FiO2 60% and PEEP of 5) Activity Tolerance: Patient limited by fatigue Patient left: in bed;with bed alarm set;with SCD's reapplied Nurse Communication: Mobility status PT Visit Diagnosis: Other abnormalities of gait and mobility (R26.89);Muscle weakness (generalized) (M62.81)     Time: 0109-3235 PT Time Calculation (min) (ACUTE ONLY): 31 min  Charges:  $Therapeutic Activity: 8-22 mins                     Karma Ganja, PT, DPT   Acute Rehabilitation Department Pager #: [phone]   Otho Bellows 04/26/2021, 6:23 PM

## 2021-04-26 NOTE — Progress Notes (Signed)
Neurosurgery Service Progress Note  Subjective: No acute events overnight, drain with slowed output, wasn't reliably FC for nursing overnight but was on rounds  Objective: Vitals:   04/26/21 0400 04/26/21 0500 04/26/21 0600 04/26/21 0700  BP: (!) 142/79 (!) 147/98 (!) 142/90 123/71  Pulse: (!) 101 (!) 109 (!) 105 98  Resp: 17 (!) 22 19 16   Temp: 99 F (37.2 C)     TempSrc: Axillary     SpO2: 97% 97% 98% 98%  Weight:      Height:        Physical Exam: Intubated, sedation held, eyes open spontaneously, wiggles toes to command bilaterally, R eye taped shut, L eye PERRL w/ full ROM, no movement BUE   Assessment & Plan: 65 y.o. 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 CTH with pneumocephalus, blood products in the resection cavity, improvement in aqueductal stenosis but persistent supratentorial ventriculomegaly, 5/18 s/p R F EVD for persistent depressed mental status. 5/19 MRI w/ good resection, using rough measurements 86% of tumor volume, expected diffusion along the resection cavity, small dot at the right inf vs sup colliculus, no obvious perforator strokes  -continue dex, will fall off today -EVD working but the low output combined with less reliable FC, will get a CTH to evaluate ventricular size to see if we need to terminate her clamp trial and flush / tPA vs replace the drain.  -CCM recs for vent management -R eye closure is only complete with effort, likely HB3, will need to use lubricating gtt and tape shut for corneal protection - likely will help w/ diplopia too while she's awake -SCDs/TEDs, SQH  Addendum: CTH reviewed, ventricles actually look significantly better. On the sagittal recons, her aqueduct appears open again, likely 2/2 reduction in edema and tumor mass. There is a hyperdensity along the left side of the catheter tip, likely producing the partial obstruction. Was FC for me this morning, as long  as she clinically does well then won't change anything with the hope of her getting strong enough for extubation.  Judith Part  04/26/21 7:54 AM

## 2021-04-26 NOTE — Progress Notes (Signed)
OT Cancellation Note  Patient Details Name: Tammy Hernandez MRN: [id number] DOB: 1955/12/28   Cancelled Treatment:    Reason Eval/Treat Not Completed: Patient at procedure or test/ unavailable (CT. Will return as schedule allows.)  [place], OTR/L Acute Rehab Pager: [phone] Office: [phone] 04/26/2021, 10:12 AM

## 2021-04-26 NOTE — Progress Notes (Signed)
RT NOTE: RT transported patient on ventilator from room 4N17 to CT and back to room 4N17 with no complications. Vitals are stable. RT will continue to monitor.  °

## 2021-04-26 NOTE — Progress Notes (Addendum)
Occupational Therapy Treatment Patient Details Name: Tammy Hernandez MRN: [id number] DOB: September 18, 1956 Today's Date: 04/26/2021    History of present illness 65 y.o. woman with progressive ataxia, R sided hearing loss, and myelopathy as well as some dysphagia. 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 REintubated in OR due to somnolence. 5/18 Right frontal ventriculostomy with EVD placement. /19 MRI w/ good resection, using rough measurements 86% of tumor volume, expected diffusion along the resection cavity, small dot at the right inf vs sup colliculus, no obvious perforator strokes. PMH - arthritis; pre-diabetic; osteoporosis.   OT comments  Pt continues to present with decreased arousal and following of commands. Pt tolerating bed mobility and sitting at EOB with Total A +2. Pt requiring total hand over hand to perform oral care and washing face; no active movement of BUEs. Facilitating BUE/BLE PROM. VSS on vent with setting at 60% O2 conc and PEEP 5; slight elevation in HR to 122 with bed mobility. Will continue to assess for dc recommendation. Will continue to follow acutely as admitted.    Follow Up Recommendations  Supervision/Assistance - 24 hour (Will further assess)    Equipment Recommendations  Other (comment) (TBA)    Recommendations for Other Services      Precautions / Restrictions Precautions Precautions: Other (comment) Precaution Comments: ventric; intubated/ET/vent;EVD clogged Restrictions Other Position/Activity Restrictions: EVD; clotted       Mobility Bed Mobility Overal bed mobility: Needs Assistance Bed Mobility: Supine to Sit;Sit to Supine     Supine to sit: Total assist;+2 for physical assistance Sit to supine: Total assist;+2 for physical assistance   General bed mobility comments: Total A for bed mobility to sit at EOB. Pt slight HR increased for sitting EOB from 110 to 120s.    Transfers                 General  transfer comment: Defered for safety    Balance Overall balance assessment: History of Falls (and ataxia)                                         ADL either performed or assessed with clinical judgement   ADL Overall ADL's : Needs assistance/impaired     Grooming: Oral care;Total assistance;Sitting Grooming Details (indicate cue type and reason): Total A for sitting balance. Total hand over hand for oral care with suction                               General ADL Comments: Total A for ADLs and bed mobility     Vision   Additional Comments: R eye tapped closed. Not opening L eye; eye not tracking when eye lid opened manually.   Perception     Praxis      Cognition Arousal/Alertness: Lethargic Behavior During Therapy: Flat affect                                   General Comments: Inconsistently following commands for movement of BLEs with delay (3-6 seconds for RLE, 10+ seconds for LLE).        Exercises Exercises: Other exercises Other Exercises Other Exercises: BUE PROM through full ROM Other Exercises: BLE PROM across all joints   Shoulder Instructions  General Comments VSS on vent. Vent on 60% O2 conc and PEEP 5. HR elevating to 120s with bed mobility.    Pertinent Vitals/ Pain       Pain Assessment: Faces Faces Pain Scale: No hurt Pain Intervention(s): Monitored during session  Home Living                                          Prior Functioning/Environment              Frequency  Min 2X/week        Progress Toward Goals  OT Goals(current goals can now be found in the care plan section)  Progress towards OT goals: Not progressing toward goals - comment  Acute Rehab OT Goals Patient Stated Goal: per husband for his wife to get better OT Goal Formulation: With family Time For Goal Achievement: 05/06/21 Potential to Achieve Goals: Good ADL Goals Additional ADL Goal #1:  Pt will follow 1 step commands consistently in nondistracting environment Additional ADL Goal #2: Pt will complete hand to mouth pattern with min vc in preparation for ADL activities  Plan Discharge plan remains appropriate    Co-evaluation    PT/OT/SLP Co-Evaluation/Treatment: Yes            AM-PAC OT "6 Clicks" Daily Activity     Outcome Measure   Help from another person eating meals?: Total Help from another person taking care of personal grooming?: Total Help from another person toileting, which includes using toliet, bedpan, or urinal?: Total Help from another person bathing (including washing, rinsing, drying)?: Total Help from another person to put on and taking off regular upper body clothing?: Total Help from another person to put on and taking off regular lower body clothing?: Total 6 Click Score: 6    End of Session Equipment Utilized During Treatment: Oxygen  OT Visit Diagnosis: Other abnormalities of gait and mobility (R26.89);Muscle weakness (generalized) (M62.81);History of falling (Z91.81);Low vision, both eyes (H54.2);Ataxia, unspecified (R27.0);Other symptoms and signs involving cognitive function   Activity Tolerance Patient limited by lethargy   Patient Left in bed;with call bell/phone within reach;with bed alarm set   Nurse Communication Mobility status        Time: 6789-3810 OT Time Calculation (min): 30 min  Charges: OT General Charges $OT Visit: 1 Visit OT Treatments $Therapeutic Activity: 8-22 mins  [place], OTR/L Acute Rehab Pager: [phone] Office: [place] 04/26/2021, 3:23 PM

## 2021-04-26 NOTE — Progress Notes (Signed)
NAME:  CHRYL HOLTEN MRN:  [id number] DOB:  Feb 27, 1956 LOS: 6 ADMISSION DATE:  05/04/2021 CONSULTATION DATE:  04/25/2021 REFERRING MD:  Zada Finders CHIEF COMPLAINT:  Acute respiratory failure s/p craniotomy  History of Present Illness:  65 year old female with PMHx significant for T2DM, HLD, hypothyroidism and recent diagnosis of petroclival meningoma as evidenced by ataxia, R-sided hearing loss, myelopathy and dysphagia who presented to [facility] 5/17 for R retrosigmoid craniotomy for resection of meningioma.   Patient tolerated procedure well and was extubated in the OR; unfortunately, remained too somnolent to protect her airway and was subsequently reintubated. PCCM consulted for vent management.  Significant Hospital Events: Including procedures, antibiotic start and stop dates in addition to other pertinent events   . 5/17 - POD#0 R craniotomy for petroclival meningioma resection. Extubated in OR, too somnolent to protect her airway, reintubated. Admit to Neuro ICU post-op. . 04/21/2021 interventricular drain to be placed by neurosurgery . 04/21/2021 noted MRI . 04/21/2021 arterial line discontinued . 5/21 - Intermittently following commands . 5/22 - C/f EVD clogging . 5/23 - More awake this AM, following commands in LE. Decreased EVD output. Repeat CT Head to assess ventricles.  Interim History / Subjective:  No significant events overnight Initially more somnolent/not waking up/not following commands this morning More awake in late morning, following commands in LE, tracking with L eye NSGY at bedside, updated husband  Objective:  Blood pressure 123/71, pulse 98, temperature 99 F (37.2 C), temperature source Axillary, resp. rate 16, height 5\' 3"  (1.6 m), weight 71.5 kg, SpO2 98 %.    Vent Mode: PRVC FiO2 (%):  [30 %-70 %] 60 % Set Rate:  [16 bmp] 16 bmp Vt Set:  [420 mL] 420 mL PEEP:  [5 cmH20] 5 cmH20 Pressure Support:  [8 cmH20] 8 cmH20 Plateau Pressure:  [14 cmH20-15 cmH20] 14  cmH20   Intake/Output Summary (Last 24 hours) at 04/26/2021 0723 Last data filed at 04/26/2021 0600 Gross per 24 hour  Intake 1250 ml  Output 1053 ml  Net 197 ml   Filed Weights   04/23/21 0400 04/24/21 0400 04/25/21 0400  Weight: 74.5 kg 74.3 kg 71.5 kg   Physical Examination: General: Chronically ill-appearing woman in NAD. HEENT: [place]/AT, anicteric sclera, R eye bandaged, L pupil 17mm, reactive; tracking with L eye with slight R gaze preference, moist mucous membranes. ETT in place with moderate secretions. Neuro: Awake, alert. Responds to verbal stimuli. Following commands consistently. Moves BLE spontaneously. +Cough and +Gag  CV: RRR, no m/g/r. PULM: Breathing even and unlabored on vent (PEEP 5 ,FiO2 60%). Lung fields CTAB anteriorly, diminished at bilateral bases. GI: Soft, nontender, nondistended. Normoactive bowel sounds. Extremities: Trace bilateral symmetric LE edema noted. Skin: Warm/dry, no rashes.  Labs/imaging that I have personally reviewed: (right click and "Reselect all SmartList Selections" daily)  Na 142, K 4.3, CO2 30, BUN 42, Cr 0.62 Ca 8.5, Phos 3.6, Mg 2.5  Glucoses 155-200  CXR 5/19 - Mild L basilar atelectasis/infiltrate, improved pulmonary insufflation  Resolved Hospital Problem List:   Hyponatremia - mild, stable, monitor  Assessment & Plan:   Acute respiratory failure with hypoxia and hypercapnia requiring mechanical ventilation - due to inadequate respiratory drive from postoperative brainstem swelling - Continue full vent support (4-8cc/kg IBW) - Wean FiO2 for O2 sat > 90% - Daily WUA/SBT - VAP bundle - Pulmonary hygiene - Repeat CXR today 5/23 - Mental status improving; extubation remains precluded by weakness/increased FiO2 need - will need to see improvement before extubation attempt,  goal to avoid tracheostomy  Meningioma s/p R craniotomy with retrosigmoid resection of petroclival meningioma Obstructive hydrocephalus s/p EVD -  Postoperative care per NSGY - S/p dex course (end 5/22) - EVD management and further head imaging per NSGY - Repeat CT Head today, 5/23  Hypertension - Continue labetalol for goal SBP < 160  Diabetes mellitus type 2 with hyperglycemia - Continue standing insulin, SSI - Now s/p steroid course, would expect insulin needs will decrease  Best Practice: (right click and "Reselect all SmartList Selections" daily)   Per primary team  Critical care time: 30 minutes   Lestine Mount, PA-C [facility] 04/26/21 7:35 AM  Please see [email or web address] for pager details.  From 7A-7P if no response, please call [phone] After hours, please call ELink [phone]

## 2021-04-27 DIAGNOSIS — J9601 Acute respiratory failure with hypoxia: Secondary | ICD-10-CM | POA: Diagnosis not present

## 2021-04-27 LAB — BASIC METABOLIC PANEL
Anion gap: 9 (ref 5–15)
BUN: 47 mg/dL — ABNORMAL HIGH (ref 8–23)
CO2: 27 mmol/L (ref 22–32)
Calcium: 8.7 mg/dL — ABNORMAL LOW (ref 8.9–10.3)
Chloride: 105 mmol/L (ref 98–111)
Creatinine, Ser: 0.65 mg/dL (ref 0.44–1.00)
GFR, Estimated: >60 mL/min (ref >60)
Glucose, Bld: 183 mg/dL — ABNORMAL HIGH (ref 70–99)
Potassium: 4.5 mmol/L (ref 3.5–5.1)
Sodium: 141 mmol/L (ref 135–145)

## 2021-04-27 LAB — GLUCOSE, CAPILLARY
Glucose-Capillary: 170 mg/dL — ABNORMAL HIGH (ref 70–99)
Glucose-Capillary: 170 mg/dL — ABNORMAL HIGH (ref 70–99)
Glucose-Capillary: 182 mg/dL — ABNORMAL HIGH (ref 70–99)
Glucose-Capillary: 191 mg/dL — ABNORMAL HIGH (ref 70–99)
Glucose-Capillary: 204 mg/dL — ABNORMAL HIGH (ref 70–99)
Glucose-Capillary: 206 mg/dL — ABNORMAL HIGH (ref 70–99)

## 2021-04-27 LAB — PHOSPHORUS: Phosphorus: 2.7 mg/dL (ref 2.5–4.6)

## 2021-04-27 LAB — MAGNESIUM: Magnesium: 2.5 mg/dL — ABNORMAL HIGH (ref 1.7–2.4)

## 2021-04-27 NOTE — Progress Notes (Signed)
Pt placed on PSV 12/5 per wean protocol and pt is tolerating well at this time. RN aware. RT to continue to monitor.

## 2021-04-27 NOTE — Progress Notes (Signed)
Neurosurgery Service Progress Note  Subjective: No acute events overnight, EVD with no output, exam stable but a little bit less interactive today  Objective: Vitals:   04/27/21 1400 04/27/21 1500 04/27/21 1503 04/27/21 1600  BP: (!) 154/89 (!) 152/77 (!) 152/77   Pulse: (!) 109 (!) 107 (!) 109   Resp: (!) 21 (!) 28 (!) 24   Temp:    99.1 F (37.3 C)  TempSrc:    Axillary  SpO2: 90% 91% (S) 90%   Weight:      Height:        Physical Exam: Intubated, sedation held, eyes open to pain, wiggles toes to command bilaterally, R eye taped shut, L eye PERRL w/ full ROM, no movement BUE   Assessment & Plan: 65 y.o. 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 CTH with pneumocephalus, blood products in the resection cavity, improvement in aqueductal stenosis but persistent supratentorial ventriculomegaly, 5/18 s/p R F EVD for persistent depressed mental status. 5/19 MRI w/ good resection, using rough measurements 86% of tumor volume, expected diffusion along the resection cavity, small dot at the right inf vs sup colliculus, no obvious perforator strokes, 5/23 rpt CTH with resolution of aqueductal stenosis  -doing fairly well with EVD clamp trial, given that she's slightly less awake today, will get a CTH in the am and d/c EVD in AM if exam stable overnight and CTH shows decompressed ventricles -CCM recs for vent management -R eye closure is only complete with effort, likely HB3, will need to use lubricating gtt and tape shut for corneal protection - likely will help w/ diplopia too while she's awake -SCDs/TEDs, SQH   Judith Part  04/27/21 5:22 PM

## 2021-04-27 NOTE — Progress Notes (Signed)
NAME:  Tammy Hernandez MRN:  [id number] DOB:  1956/03/06 LOS: 7 ADMISSION DATE:  04/14/2021 CONSULTATION DATE:  04/07/2021 REFERRING MD:  Zada Finders CHIEF COMPLAINT:  Acute respiratory failure s/p craniotomy  History of Present Illness:  65 year old female with PMHx significant for T2DM, HLD, hypothyroidism and recent diagnosis of petroclival meningoma as evidenced by ataxia, R-sided hearing loss, myelopathy and dysphagia who presented to [facility] 5/17 for R retrosigmoid craniotomy for resection of meningioma.   Patient tolerated procedure well and was extubated in the OR; unfortunately, remained too somnolent to protect her airway and was subsequently reintubated. PCCM consulted for vent management.  Significant Hospital Events: Including procedures, antibiotic start and stop dates in addition to other pertinent events   . 5/17 - POD#0 R craniotomy for petroclival meningioma resection. Extubated in OR, too somnolent to protect her airway, reintubated. Admit to Neuro ICU post-op. . 04/21/2021 interventricular drain to be placed by neurosurgery . 04/21/2021 noted MRI . 04/21/2021 arterial line discontinued . 5/21 - Intermittently following commands . 5/22 - C/f EVD clogging . 5/23 - More awake this AM, following commands in LE. Decreased EVD output. Repeat CT Head to assess ventricles. . 5/24 - CT Head with improved appearance of ventricles per NSGY. Decreased FiO2 need, stable at 40%. Still with waxing/waning mental status.  Interim History / Subjective:  No significant events overnight Very drowsy/somnolent on my exam this AM, not awake enough to follow commands Decreased EVD output CT Head with improved ventricle size Continuing to work toward strengthening for successful extubation  Objective:  Blood pressure (!) 140/92, pulse (!) 108, temperature 99.5 F (37.5 C), temperature source Axillary, resp. rate 19, height 5\' 3"  (1.6 m), weight 71.5 kg, SpO2 92 %.    Vent Mode: PRVC FiO2 (%):  [50  %-60 %] 50 % Set Rate:  [16 bmp] 16 bmp Vt Set:  [420 mL] 420 mL PEEP:  [5 cmH20] 5 cmH20 Plateau Pressure:  [14 cmH20-18 cmH20] 18 cmH20   Intake/Output Summary (Last 24 hours) at 04/27/2021 0735 Last data filed at 04/27/2021 0600 Gross per 24 hour  Intake 1200 ml  Output 1225 ml  Net -25 ml   Filed Weights   04/24/21 0400 04/25/21 0400 04/27/21 0500  Weight: 74.3 kg 71.5 kg 71.5 kg   Physical Examination: General: Chronically ill-appearing elderly female in NAD. HEENT: [place]/AT, anicteric sclera, R eye with dressing in place taped closed, L pupil 58mm, round and reactive, moist mucous membranes. Neuro: Lethargic. Not readily opening eyes/tracking today on my exam. Responds to tactile stimuli. Following commands intermittently. Moves BLE spontaneously. +Cough and +Gag  CV: RRR, no m/g/r. PULM: Breathing even and unlabored on vent (PEEP 5, FiO2 40%). Lung fields CTAB. GI: Soft, nontender, nondistended. Normoactive bowel sounds. Extremities: No LE edema noted. Skin: Warm/dry, no rashes.  Labs/imaging that I have personally reviewed: (right click and "Reselect all SmartList Selections" daily)  Na 141, K 4.5, CO2 27, BUN 47, Cr 0.65 Ca 8.7, Phos 2.7, Mg 2.5  Glucoses 170-204 last 24H  CXR 5/23 - No acute cardiopulmonary abnormality  CT Head 5/23 - R frontal approach EVD placed with resolved ventriculomegaly since 5/18, trace hemorrhage with mild edema along the course of the catheter and small volume new IVH, no new intracranial abnormality  Resolved Hospital Problem List:   Hyponatremia - mild, stable, monitor  Assessment & Plan:   Acute respiratory failure with hypoxia and hypercapnia requiring mechanical ventilation - due to inadequate respiratory drive from postoperative brainstem swelling -  Continue full vent support (4-8cc/kg IBW) - Wean FiO2 for O2 sat > 90% - Daily WUA/SBT - VAP bundle - Pulmonary hygiene - Mental status continues to wax and wane, extubations remains  precluded by weakness; continues to work with RT/PT/OT; goal to improve strength for successful extubation and to avoid tracheostomy  Meningioma s/p R craniotomy with retrosigmoid resection of petroclival meningioma Obstructive hydrocephalus s/p EVD - Postoperative care per NSGY - S/p dex course (ended 5/22) - EVD management and any additional head imaging per NSGY  Hypertension - Continue labetalol for goal SBP < 160  Diabetes mellitus type 2 with hyperglycemia - Continue standing insulin, SSI - Improving glucoses in the setting of completed dex course  Best Practice: (right click and "Reselect all SmartList Selections" daily)   Per primary team  Critical care time: 30 minutes   Lestine Mount, PA-C [place] 04/27/21 7:35 AM  Please see [email or web address] for pager details.  From 7A-7P if no response, please call [phone] After hours, please call ELink [phone]

## 2021-04-27 NOTE — Progress Notes (Signed)
Pt placed back on full vent support due to sats dropping. Pt tolerated 4 hrs of PSV.

## 2021-04-28 ENCOUNTER — Inpatient Hospital Stay (HOSPITAL_COMMUNITY): Payer: 59

## 2021-04-28 DIAGNOSIS — J9601 Acute respiratory failure with hypoxia: Secondary | ICD-10-CM

## 2021-04-28 LAB — PHOSPHORUS: Phosphorus: 2.9 mg/dL (ref 2.5–4.6)

## 2021-04-28 LAB — HEPATIC FUNCTION PANEL
ALT: 430 U/L — ABNORMAL HIGH (ref 0–44)
AST: 210 U/L — ABNORMAL HIGH (ref 15–41)
Albumin: 2.5 g/dL — ABNORMAL LOW (ref 3.5–5.0)
Alkaline Phosphatase: 102 U/L (ref 38–126)
Bilirubin, Direct: 0.1 mg/dL (ref 0.0–0.2)
Indirect Bilirubin: 0.6 mg/dL (ref 0.3–0.9)
Total Bilirubin: 0.7 mg/dL (ref 0.3–1.2)
Total Protein: 5.5 g/dL — ABNORMAL LOW (ref 6.5–8.1)

## 2021-04-28 LAB — BASIC METABOLIC PANEL
Anion gap: 7 (ref 5–15)
BUN: 44 mg/dL — ABNORMAL HIGH (ref 8–23)
CO2: 30 mmol/L (ref 22–32)
Calcium: 8.6 mg/dL — ABNORMAL LOW (ref 8.9–10.3)
Chloride: 105 mmol/L (ref 98–111)
Creatinine, Ser: 0.61 mg/dL (ref 0.44–1.00)
GFR, Estimated: >60 mL/min (ref >60)
Glucose, Bld: 173 mg/dL — ABNORMAL HIGH (ref 70–99)
Potassium: 4.7 mmol/L (ref 3.5–5.1)
Sodium: 142 mmol/L (ref 135–145)

## 2021-04-28 LAB — GLUCOSE, CAPILLARY
Glucose-Capillary: 166 mg/dL — ABNORMAL HIGH (ref 70–99)
Glucose-Capillary: 178 mg/dL — ABNORMAL HIGH (ref 70–99)
Glucose-Capillary: 184 mg/dL — ABNORMAL HIGH (ref 70–99)
Glucose-Capillary: 187 mg/dL — ABNORMAL HIGH (ref 70–99)
Glucose-Capillary: 190 mg/dL — ABNORMAL HIGH (ref 70–99)
Glucose-Capillary: 194 mg/dL — ABNORMAL HIGH (ref 70–99)

## 2021-04-28 LAB — MAGNESIUM: Magnesium: 2.6 mg/dL — ABNORMAL HIGH (ref 1.7–2.4)

## 2021-04-28 IMAGING — CT CT HEAD W/O CM
2 of 3 series · 14 of 30 positions shown, 16 images · non-contrast
Comparison: [DATE].

CLINICAL DATA: Hydrocephalus, EVD clamped. Follow-up ventricular
size.

EXAM:
CT HEAD WITHOUT CONTRAST
TECHNIQUE: Contiguous axial images were obtained from the base of the skull
through the vertex without intravenous contrast.

[Series 4: head bone · axial · 0.41mm/px · z∈[-608,-514]mm · 6 of 82 slices shown]
[im 6/82  bone]
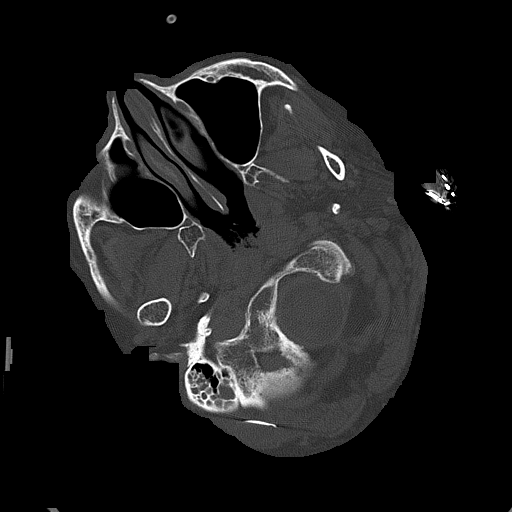
[im 18/82  bone]
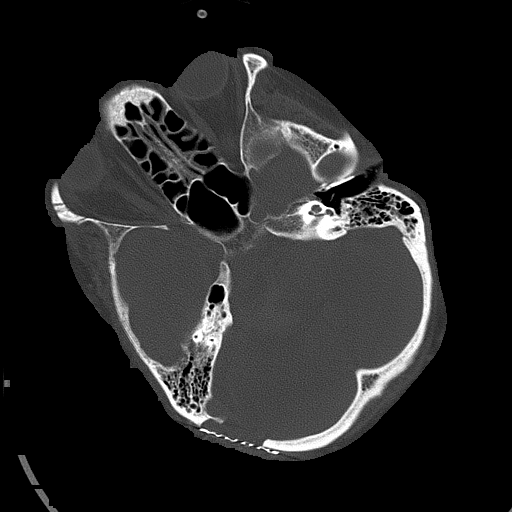
[im 29/82  bone]
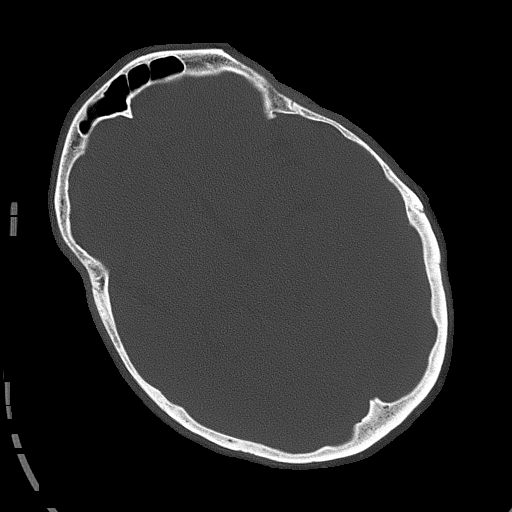
[im 35/82  bone]
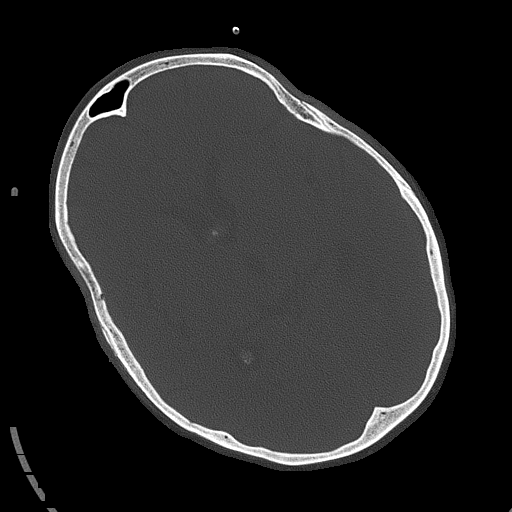
[im 47/82  bone]
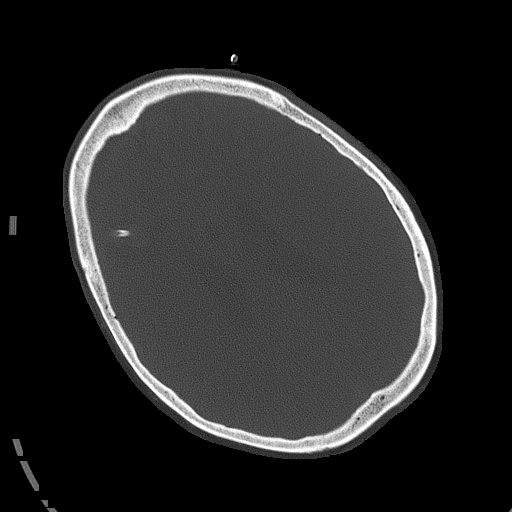
[im 53/82  bone]
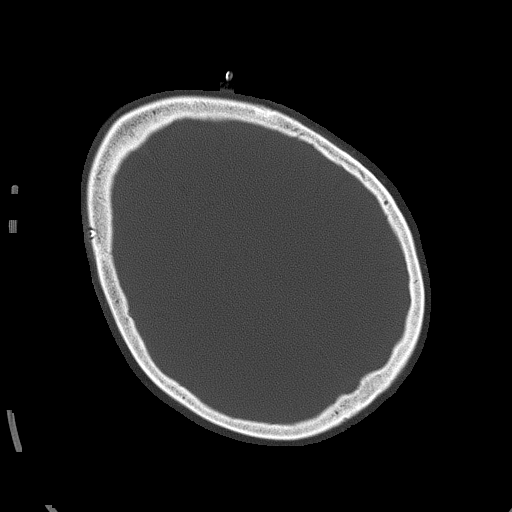

[Series 7: head without ax · axial · non-contrast · 0.31mm/px · z∈[-607,-481]mm · 8 of 55 slices shown, 10 images]
[im 7/55  brain]
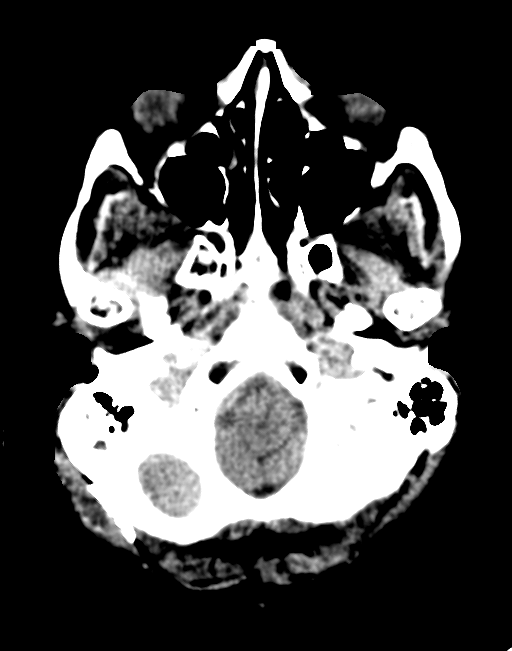
[im 7/55  bone]
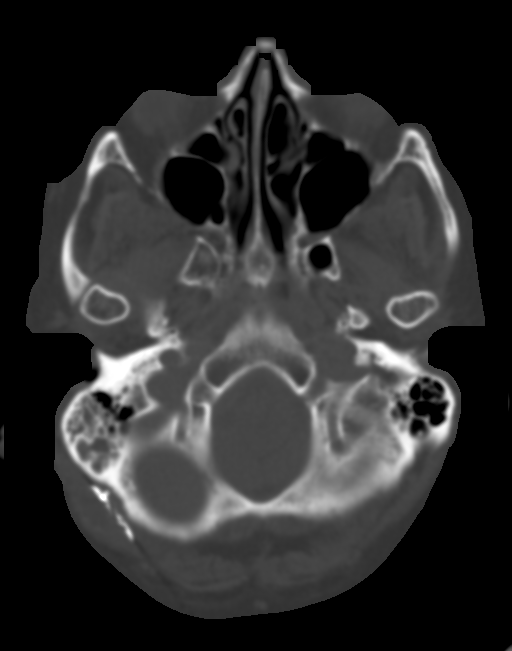
[im 13/55  brain]
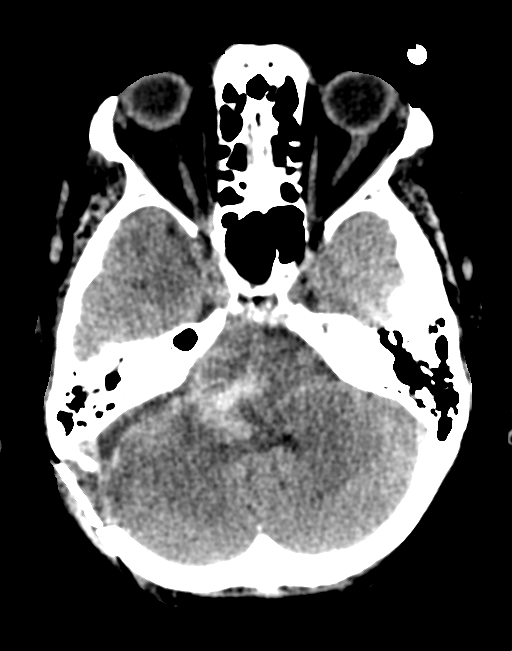
[im 19/55  brain]
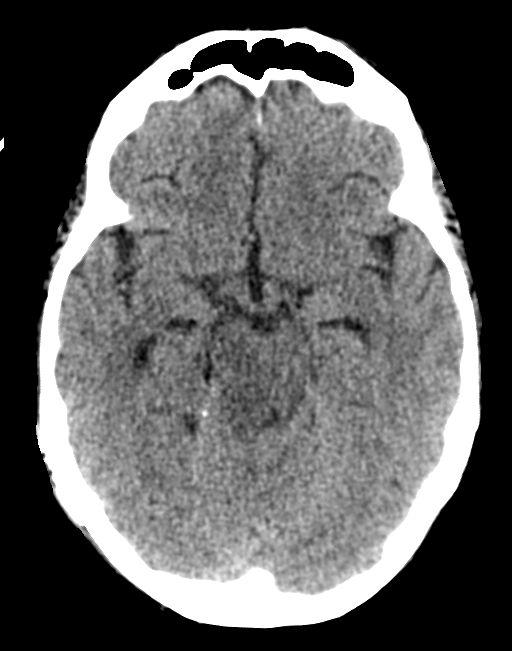
[im 25/55  brain]
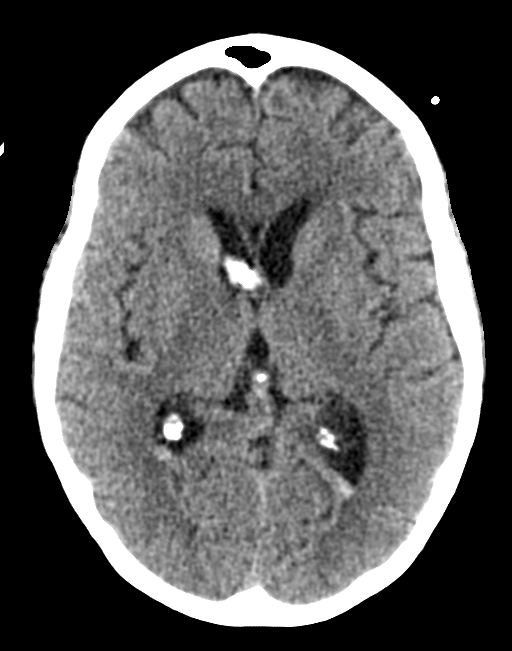
[im 31/55  brain]
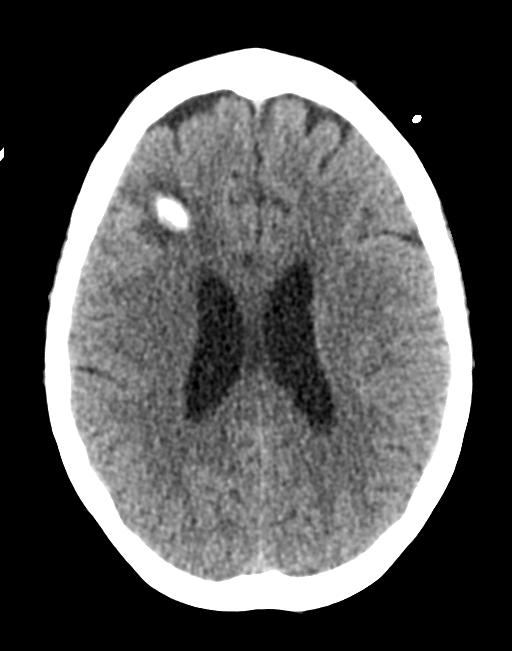
[im 31/55  bone]
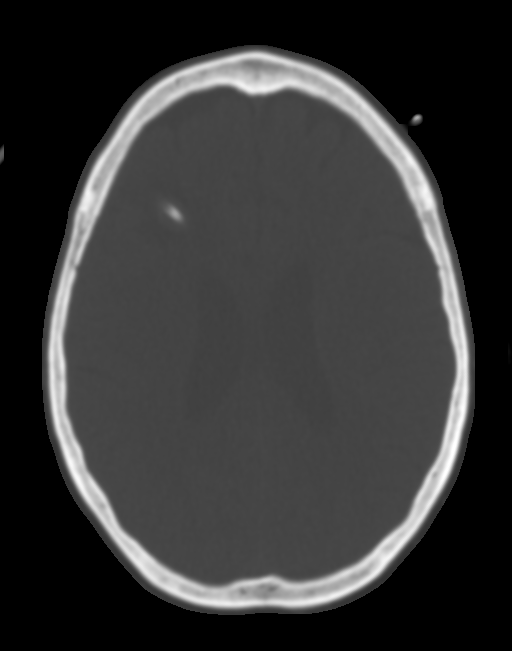
[im 37/55  brain]
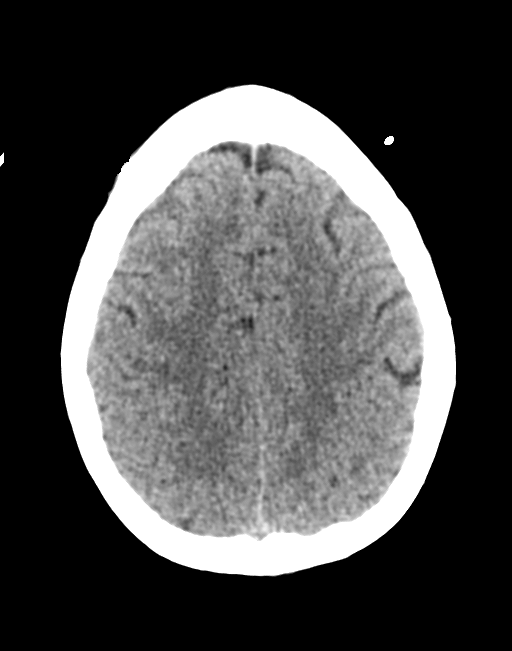
[im 43/55  brain]
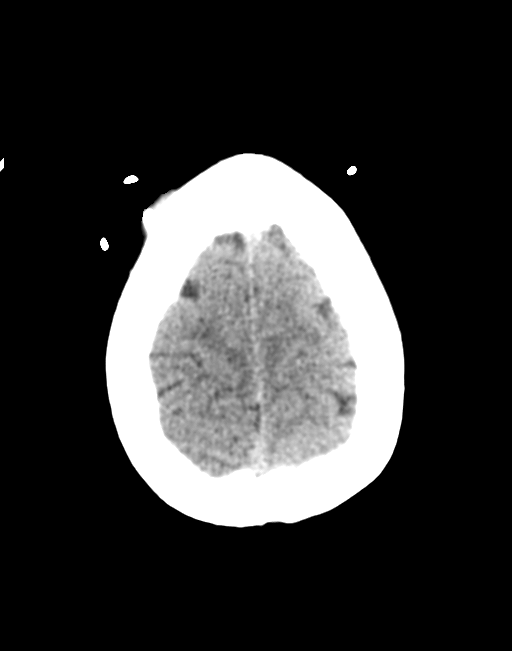
[im 49/55  brain]
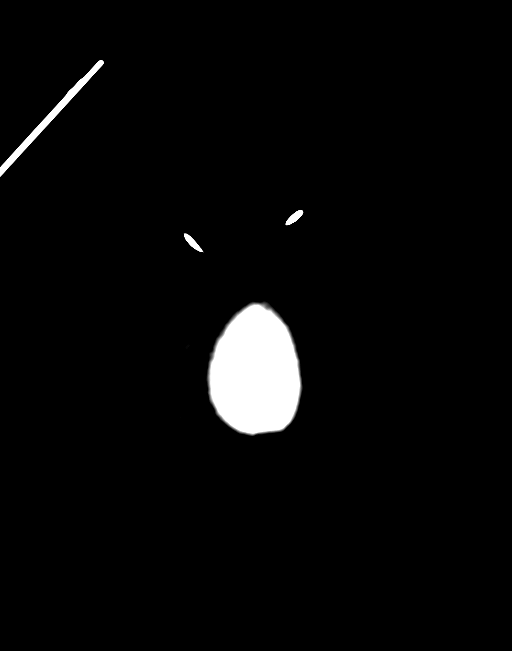

[14 of 30 positions shown; findings below may reference images not displayed]

FINDINGS: Brain: Similar position a right frontal approach ventricular
catheter with the tip in the anterior right lateral ventricle near
the foramina [place]. Similar mild edema and hemorrhage along the
ventricular catheter tract in the right frontal lobe. Similar small
volume of layering intraventricular hemorrhage in the lateral
ventricles bilaterally. Similar ventricular size.

Similar heterogeneous intraparenchymal hemorrhage involving the
right brainstem. Similar suspected small volume of associated
adjacent extra-axial hemorrhage. Similar mass effect on the adjacent
fourth ventricle. Similar small volume of hemorrhage/fluid adjacent
to craniectomy site.

No evidence of acute/interval large vascular territory infarct. No
midline shift.

Vascular: No hyperdense vessel identified. Calcific atherosclerosis.

Skull: Right suboccipital craniectomy and cranioplasty. Right
frontal burr hole. No acute finding.

Sinuses/Orbits: Mild mucosal thickening of scattered ethmoid air
cells and the right sphenoid sinus. Fluid layering in the pharynx,
likely related to intubation.

Other: Similar moderate right mastoid effusion and middle ear fluid.
IMPRESSION: 1. Similar ventricular size with similar positioning of the right
frontal ventricular catheter, mild hemorrhage/edema along the
catheter, and small volume of intraventricular hemorrhage.
2. Similar appearance of brainstem hemorrhage and edema and small
volume of postoperative extra-axial fluid/blood adjacent to the
right suboccipital craniectomy.
3. Similar moderate right mastoid effusion and middle ear fluid.

## 2021-04-28 IMAGING — DX DG ABD PORTABLE 1V
1 series · 1 of 1 positions shown · non-contrast
Comparison: None.

CLINICAL DATA: Feeding tube placement.

EXAM:
PORTABLE ABDOMEN - 1 VIEW

[abdomen kub]
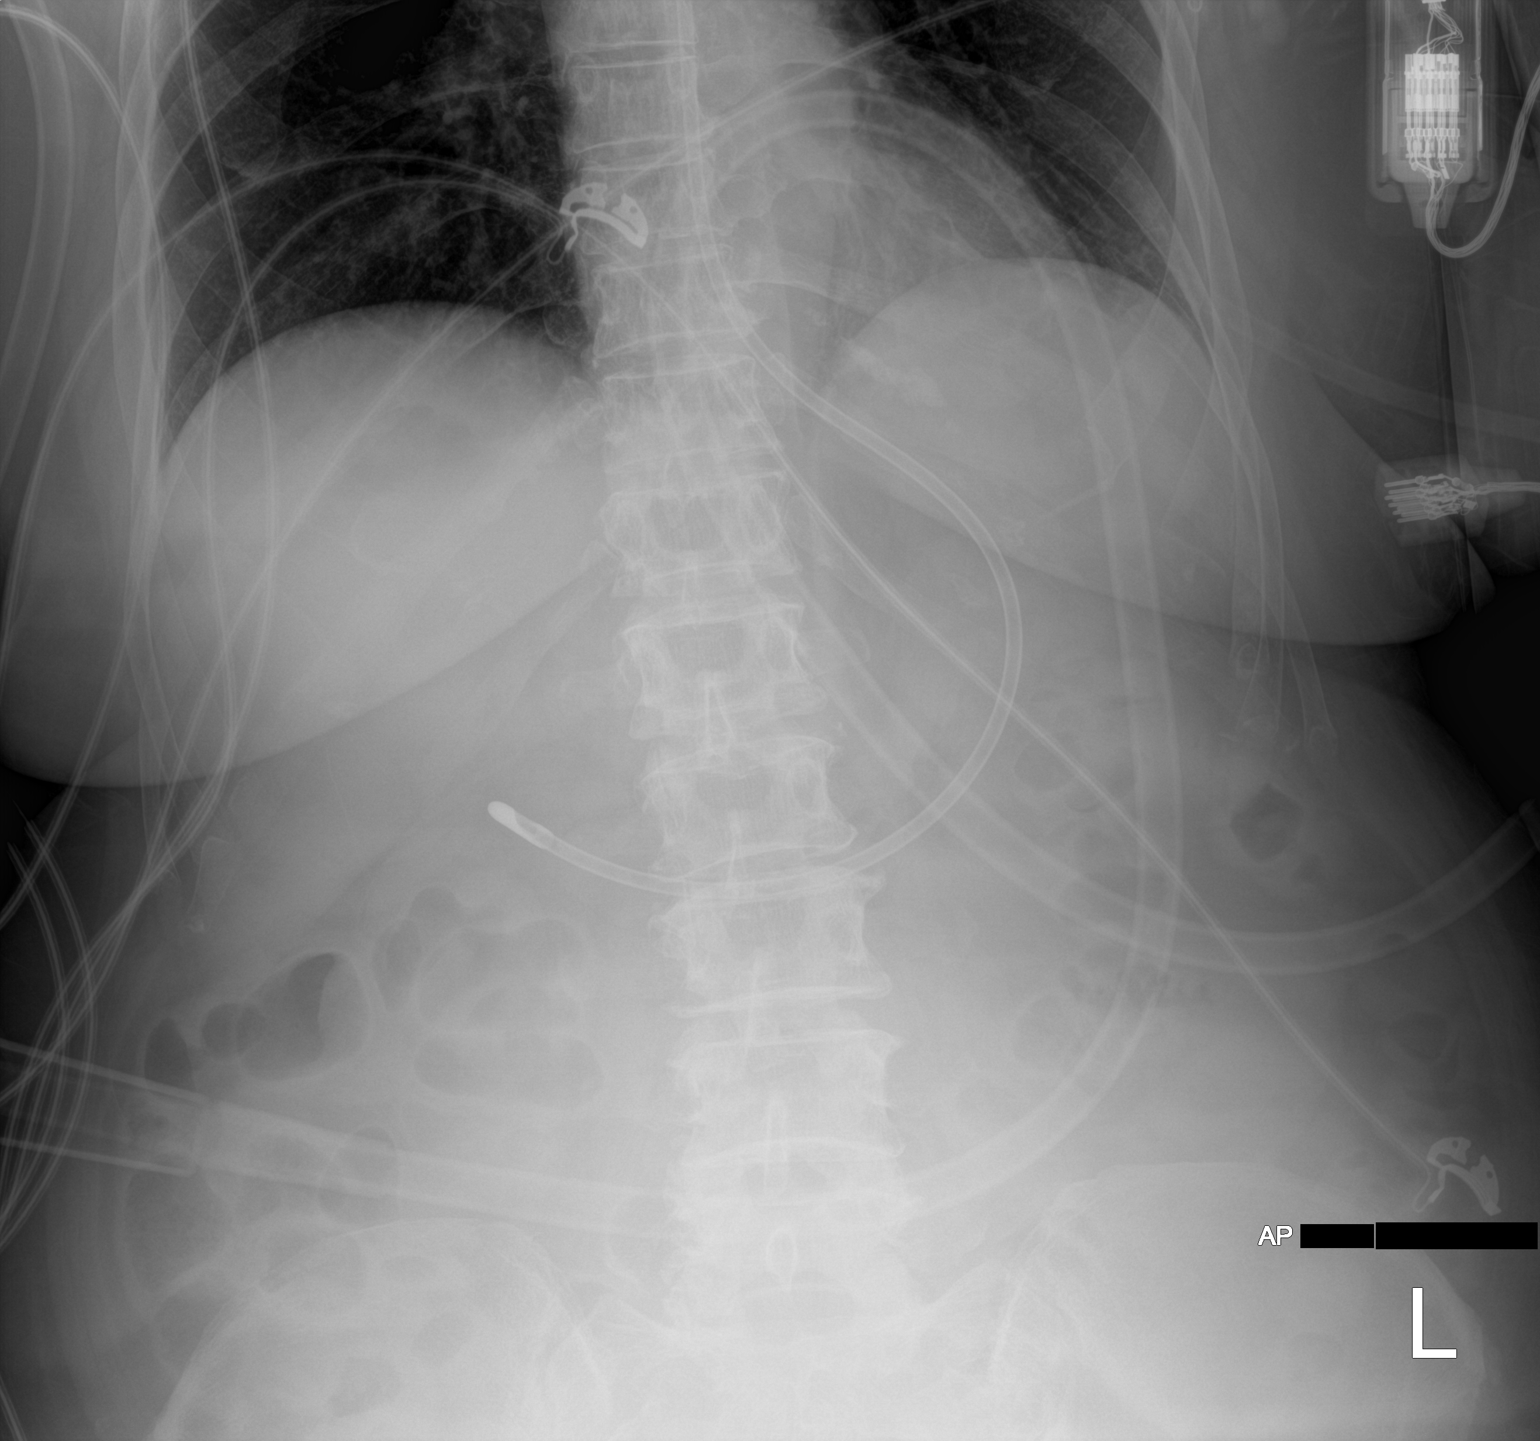

[1 of 1 positions shown; findings below may reference images not displayed]

FINDINGS: Tip of the weighted enteric tube below the diaphragm in the right
upper quadrant, in the region of the pylorus. Overlying monitoring
devices project over the abdomen. Nonobstructive bowel gas pattern
in the upper abdomen.
IMPRESSION: Tip of the weighted enteric tube below the diaphragm in the right
upper quadrant, in the region of the pylorus.

## 2021-04-28 MED ORDER — LIDOCAINE HCL (PF) 1 % IJ SOLN
INTRAMUSCULAR | Status: AC
  Filled 2021-04-28: qty 5

## 2021-04-28 NOTE — Progress Notes (Signed)
Occupational Therapy Treatment Patient Details Name: Tammy Hernandez MRN: [id number] DOB: 1956-06-05 Today's Date: 04/28/2021    History of present illness 65 y.o. woman with progressive ataxia, R sided hearing loss, and myelopathy as well as some dysphagia. 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 REintubated in OR due to somnolence. 5/18 Right frontal ventriculostomy with EVD placement. /19 MRI w/ good resection, using rough measurements 86% of tumor volume, expected diffusion along the resection cavity, small dot at the right inf vs sup colliculus, no obvious perforator strokes. Marland Kitchen EVD clotted on 5/22. PMH - arthritis; pre-diabetic; osteoporosis.   OT comments  Pt with stable VSS on PS CPAP currently and total +2 total (A) for all movement. Pt does not follow commands. Pt blinks to threat once EOB. Pt overall total (A) for all care at this time. Pt does tolerate EOB on CPAP setting without any suction required. Recommendation Vent SNF at this time.   Follow Up Recommendations  SNF (vent)    Equipment Recommendations  Hospital bed    Recommendations for Other Services      Precautions / Restrictions Precautions Precaution Comments: EVD, intubated ET/ Vent Restrictions Other Position/Activity Restrictions: EVD clotted per RN       Mobility Bed Mobility Overal bed mobility: Needs Assistance Bed Mobility: Supine to Sit;Sit to Supine     Supine to sit: Total assist;+2 for physical assistance Sit to supine: Total assist;+2 for physical assistance   General bed mobility comments: opening L eye only to upright posture, blink to threat, noted to have some nystagmus but no purposeful movement otherwise    Transfers                      Balance                                           ADL either performed or assessed with clinical judgement   ADL Overall ADL's : Needs assistance/impaired                                        General ADL Comments: total (A) For all     Vision   Additional Comments: R eye remains open despite tape present. R gaze preference, does not track at this time   Perception     Praxis      Cognition Arousal/Alertness: Lethargic Behavior During Therapy: Flat affect Overall Cognitive Status: Difficult to assess                                 General Comments: not following any commands. response to painful stimuli in L LE only, blinks to threat only        Exercises Exercises: Other exercises Other Exercises Other Exercises: BUE PROM   Shoulder Instructions       General Comments BP 144/91 (106) HR 107 RR 14 on arrival. Pt sitting 146/100 HR 133 PS CPAP 40% FIO2 Peep 5    Pertinent Vitals/ Pain       Pain Assessment: Faces Faces Pain Scale: No hurt Pain Intervention(s): Monitored during session;Repositioned  Home Living  Prior Functioning/Environment              Frequency  Min 2X/week        Progress Toward Goals  OT Goals(current goals can now be found in the care plan section)  Progress towards OT goals: Not progressing toward goals - comment  Acute Rehab OT Goals Patient Stated Goal: pt unable to participate OT Goal Formulation: With family Time For Goal Achievement: 05/06/21 Potential to Achieve Goals: Good ADL Goals Additional ADL Goal #1: Pt will follow 1 step commands consistently in nondistracting environment Additional ADL Goal #2: Pt will complete hand to mouth pattern with min vc in preparation for ADL activities  Plan Discharge plan remains appropriate    Co-evaluation    PT/OT/SLP Co-Evaluation/Treatment: Yes Reason for Co-Treatment: Complexity of the patient's impairments (multi-system involvement);Necessary to address cognition/behavior during functional activity;For patient/therapist safety;To address functional/ADL transfers   OT goals  addressed during session: ADL's and self-care;Proper use of Adaptive equipment and DME;Strengthening/ROM      AM-PAC OT "6 Clicks" Daily Activity     Outcome Measure   Help from another person eating meals?: Total Help from another person taking care of personal grooming?: Total Help from another person toileting, which includes using toliet, bedpan, or urinal?: Total Help from another person bathing (including washing, rinsing, drying)?: Total Help from another person to put on and taking off regular upper body clothing?: Total Help from another person to put on and taking off regular lower body clothing?: Total 6 Click Score: 6    End of Session Equipment Utilized During Treatment: Oxygen  OT Visit Diagnosis: Other abnormalities of gait and mobility (R26.89);Muscle weakness (generalized) (M62.81);History of falling (Z91.81);Low vision, both eyes (H54.2);Ataxia, unspecified (R27.0);Other symptoms and signs involving cognitive function   Activity Tolerance Patient limited by lethargy   Patient Left in bed;with call bell/phone within reach;with bed alarm set   Nurse Communication Mobility status        Time: [phone] OT Time Calculation (min): 23 min  Charges: OT General Charges $OT Visit: 1 Visit OT Treatments $Therapeutic Activity: 8-22 mins   Brynn, OTR/L  Acute Rehabilitation Services Pager: [phone] Office: [phone] .    Jeri Modena 04/28/2021, 8:33 AM

## 2021-04-28 NOTE — Progress Notes (Signed)
Transported pt on vent to CT and back to room 4N17 with no issues.

## 2021-04-28 NOTE — Progress Notes (Signed)
Orthopedic Tech Progress Note Patient Details:  Tammy Hernandez September 28, 1956 [id number]  Ortho Devices Type of Ortho Device: Prafo boot/shoe Ortho Device/Splint Location: BLE Ortho Device/Splint Interventions: Ordered,Application   Post Interventions Patient Tolerated: Well Instructions Provided: [facility] [place] 04/28/2021, 11:02 AM

## 2021-04-28 NOTE — Progress Notes (Addendum)
NAME:  Tammy Hernandez MRN:  [id number] DOB:  1956-05-02 LOS: 4 ADMISSION DATE:  04/07/2021 CONSULTATION DATE:  04/13/2021 REFERRING MD:  Zada Finders CHIEF COMPLAINT:  Acute respiratory failure s/p craniotomy  History of Present Illness:  65 year old female with PMHx significant for T2DM, HLD, hypothyroidism and recent diagnosis of petroclival meningoma as evidenced by ataxia, R-sided hearing loss, myelopathy and dysphagia who presented to [facility] 5/17 for R retrosigmoid craniotomy for resection of meningioma.   Patient tolerated procedure well and was extubated in the OR; unfortunately, remained too somnolent to protect her airway and was subsequently reintubated. PCCM consulted for vent management.  Significant Hospital Events: Including procedures, antibiotic start and stop dates in addition to other pertinent events   . 5/17 - POD#0 R craniotomy for petroclival meningioma resection. Extubated in OR, too somnolent to protect her airway, reintubated. Admit to Neuro ICU post-op. . 04/21/2021 interventricular drain to be placed by neurosurgery . 04/21/2021 noted MRI . 04/21/2021 arterial line discontinued . 5/21 - Intermittently following commands . 5/22 - C/f EVD clogging . 5/23 - More awake this AM, following commands in LE. Decreased EVD output. Repeat CT Head to assess ventricles. . 5/24 - CT Head with improved appearance of ventricles per NSGY. Decreased FiO2 need, stable at 40%. Still with waxing/waning mental status. . 5/25 Able to tolerate PSV, still somnolent, EVD removed  Interim History / Subjective:  Stable this AM without overnight events CTH stable, EVD removed Able to tolerate weaning, but remains somnolent  Objective:  Blood pressure (!) 154/89, pulse (!) 117, temperature 99.4 F (37.4 C), temperature source Axillary, resp. rate 19, height 5\' 3"  (1.6 m), weight 71 kg, SpO2 92 %.    Vent Mode: PRVC FiO2 (%):  [40 %] 40 % Set Rate:  [16 bmp] 16 bmp Vt Set:  [420 mL] 420 mL PEEP:   [5 cmH20] 5 cmH20 Pressure Support:  [12 cmH20] 12 cmH20 Plateau Pressure:  [14 cmH20-19 cmH20] 16 cmH20   Intake/Output Summary (Last 24 hours) at 04/28/2021 0731 Last data filed at 04/28/2021 0600 Gross per 24 hour  Intake 1200 ml  Output 925 ml  Net 275 ml   Filed Weights   04/25/21 0400 04/27/21 0500 04/28/21 0500  Weight: 71.5 kg 71.5 kg 71 kg      General:  Elderly F, critically ill,  intubated and minimally responsive, no signs of distress  HEENT: MM pink/moist, R eye bandaged and taped, L pupil resonsive, ETT in place Neuro: somnolent, will move LE slightly with sternal rub, tolerating PSV 12/5 40% FiO2 with adequate O2 sats and respirations, + gag, no UE movement CV: s1s2 mildly tachycardic, no m/r/g PULM:  Bronchial breath sounds bilateral bases, no rhonchi or wheezing, no accessory muscle use GI: soft, bsx4 active  Extremities: warm/dry, no edema  Skin: no rashes or lesions    Labs/imaging that I have personally reviewed: (right click and "Reselect all SmartList Selections" daily)   Glu-173 BUN 44, creat 0.6 Mag 2.6 Phos 2.9  5/25 CTH-similar appearance of brainstem hemorrhage and positioning of catheter  Resolved Hospital Problem List:   Hyponatremia - mild, stable, monitor  Assessment & Plan:   Acute respiratory failure with hypoxia and hypercapnia requiring mechanical ventilation - due to inadequate respiratory drive from postoperative brainstem swelling Intubated and re-intubated 5/17  P: -Able to tolerate PSV this AM 12/5 40% FiO2, however mental status and global weakness preclude extubation -Day 8 of intubation, continue to work towards successful extubation, however will introduce possibility  of tracheostomy with family today --Maintain full vent support with SAT/SBT as tolerated -titrate Vent setting to maintain SpO2 greater than or equal to 90%. -HOB elevated 30 degrees. -Plateau pressures less than 30 cm H20.  -Follow chest x-ray, ABG prn.    -Bronchial hygiene and RT/bronchodilator protocol.    Meningioma s/p R craniotomy with retrosigmoid resection of petroclival meningioma Obstructive hydrocephalus s/p EVD Repeat head CT stable today and EVD removed by NSGY Completed course of dexamethasone 5/22 P: -management per neurosurgery -continue neuro checks and close monitoring    Hypertension -Continue Labetalol for goal SBP <160    Diabetes mellitus type 2 with hyperglycemia - Continue standing insulin, SSI - Improving glucoses in the setting of completed dex course  Best Practice: (right click and "Reselect all SmartList Selections" daily)   Per Primary  Family: Spoke with patient's husband to introduce the idea of possible trach if unable to successfully wean from vent and extubate.  He said he would think on this, but initially thought he and patient would want every intervention done to support her recovery  Critical care time: 35 minutes   CRITICAL CARE Performed by: Otilio Carpen Jeshua Ransford   Total critical care time: 35 minutes  Critical care time was exclusive of separately billable procedures and treating other patients.  Critical care was necessary to treat or prevent imminent or life-threatening deterioration.  Critical care was time spent personally by me on the following activities: development of treatment plan with patient and/or surrogate as well as nursing, discussions with consultants, evaluation of patient's response to treatment, examination of patient, obtaining history from patient or surrogate, ordering and performing treatments and interventions, ordering and review of laboratory studies, ordering and review of radiographic studies, pulse oximetry and re-evaluation of patient's condition.   Otilio Carpen Sandie Swayze, PA-C [facility] See Amion for pager If no response to pager , please call [phone] until 7pm After 7:00 pm call Elink  [phone]?[place]

## 2021-04-28 NOTE — Progress Notes (Signed)
Nutrition Follow-up  DOCUMENTATION CODES:   Not applicable  INTERVENTION:   Tube feeding via Cortrak tube: Vital AF 1.2 at 50 ml/h (1200 ml per day) Prosource TF 45 ml once daily  Provides 1480 kcal, 101 gm protein, 973 ml free water daily.   NUTRITION DIAGNOSIS:   Inadequate oral intake related to inability to eat as evidenced by NPO status. Ongoing.   GOAL:   Patient will meet greater than or equal to 90% of their needs Met with TF.   MONITOR:   Vent status,TF tolerance,Labs  REASON FOR ASSESSMENT:   Ventilator,Consult Enteral/tube feeding initiation and management  ASSESSMENT:   65 yo female admitted S/P R craniotomy with resection of meningioma. Course complicated by hydrocephalus and acute respiratory failure requiring intubation. PMH includes DM-2, HLD, hypothyroidism, HTN, recent diagnosis of meningioma.  Pt discussed during ICU rounds and with RN.  Spoke with MD, plan for Cortrak placement. Possible exutation/trach soon. Drain removed.    5/25 cortrak placed; tip gastric   Patient is currently intubated on ventilator support MV: 9.1 L/min Temp (24hrs), Avg:99.4 F (37.4 C), Min:97.9 F (36.6 C), Max:100.4 F (38 C)   Medications reviewed and include: SSI, novolog Precedex Labs reviewed: Albumin: 2.5 CBG's: 166-194  Diet Order:   Diet Order    None      EDUCATION NEEDS:   Not appropriate for education at this time  Skin:  Skin Assessment: Reviewed RN Assessment  Last BM:  5/24  Height:   Ht Readings from Last 1 Encounters:  04/05/2021 5' 3"  (1.6 m)    Weight:   Wt Readings from Last 1 Encounters:  04/28/21 71 kg    Ideal Body Weight:  52.3 kg  BMI:  Body mass index is 27.73 kg/m.  Estimated Nutritional Needs:   Kcal:  1425  Protein:  100-115 gm  Fluid:  >/= 1.5 L  Toleen Lachapelle P., RD, LDN, CNSC See AMiON for contact information

## 2021-04-28 NOTE — Progress Notes (Signed)
Physical Therapy Treatment Patient Details Name: Tammy Hernandez MRN: [id number] DOB: February 16, 1956 Today's Date: 04/28/2021    History of Present Illness 65 y.o. woman with progressive ataxia, R sided hearing loss, and myelopathy as well as some dysphagia. 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 REintubated in OR due to somnolence. 5/18 Right frontal ventriculostomy with EVD placement. /19 MRI w/ good resection, using rough measurements 86% of tumor volume, expected diffusion along the resection cavity, small dot at the right inf vs sup colliculus, no obvious perforator strokes. Marland Kitchen EVD clotted on 5/22. PMH - arthritis; pre-diabetic; osteoporosis.    PT Comments    Pt with stable VSS on PS CPAP currently and total +2 total (A) for all movement. Pt does not follow commands. Pt blinks to threat once EOB. Pt does tolerate EOB on CPAP setting without any suction required. Provided PROM to neck, trunk, UEs, and lower extremities and ordered hard PRAFOs for bil legs to maintain ROM in preparation for mobility. If pt improves pt could benefit from intensive therapy in the CIR setting. Will continue to follow acutely.     Follow Up Recommendations  CIR     Equipment Recommendations  None recommended by PT (defer to next venue and continued OOB mobility)    Recommendations for Other Services       Precautions / Restrictions Precautions Precautions: Other (comment) Precaution Comments: EVD, intubated ET/ Vent Restrictions Weight Bearing Restrictions: No Other Position/Activity Restrictions: EVD clotted per RN    Mobility  Bed Mobility Overal bed mobility: Needs Assistance Bed Mobility: Supine to Sit;Sit to Supine     Supine to sit: Total assist;+2 for physical assistance Sit to supine: Total assist;+2 for physical assistance   General bed mobility comments: opening L eye only to upright posture, blink to threat, noted to have some nystagmus but no purposeful  movement otherwise    Transfers                 General transfer comment: Defered for safety  Ambulation/Gait             General Gait Details: Unable   Stairs             Wheelchair Mobility    Modified Rankin (Stroke Patients Only) Modified Rankin (Stroke Patients Only) Pre-Morbid Rankin Score: No significant disability Modified Rankin: Severe disability     Balance Overall balance assessment: History of Falls (and ataxia)                                          Cognition Arousal/Alertness: Lethargic Behavior During Therapy: Flat affect Overall Cognitive Status: Difficult to assess                                 General Comments: not following any commands. response to painful stimuli in L LE only, blinks to threat only      Exercises Other Exercises Other Exercises: BUE PROM Other Exercises: Bil ankle dorsiflexion PROM Other Exercises: Gentle trunk rotation PROM Other Exercises: Gentle cervical extension PROM    General Comments General comments (skin integrity, edema, etc.): BP 144/91 (106) HR 107 RR 14 on arrival. Pt sitting 146/100 HR 133 PS CPAP 40% FIO2 Peep 5; Hard PRAFOs ordered and ortho tech called      Pertinent Vitals/Pain Pain  Assessment: Faces Faces Pain Scale: No hurt Pain Intervention(s): Monitored during session    Home Living                      Prior Function            PT Goals (current goals can now be found in the care plan section) Acute Rehab PT Goals Patient Stated Goal: pt unable to participate PT Goal Formulation: Patient unable to participate in goal setting Time For Goal Achievement: 05/06/21 Potential to Achieve Goals: Fair Progress towards PT goals: Not progressing toward goals - comment (remains lethargic)    Frequency    Min 3X/week      PT Plan Current plan remains appropriate    Co-evaluation PT/OT/SLP Co-Evaluation/Treatment: Yes Reason for  Co-Treatment: Necessary to address cognition/behavior during functional activity;Complexity of the patient's impairments (multi-system involvement);For patient/therapist safety;To address functional/ADL transfers PT goals addressed during session: Mobility/safety with mobility;Balance OT goals addressed during session: ADL's and self-care;Proper use of Adaptive equipment and DME;Strengthening/ROM      AM-PAC PT "6 Clicks" Mobility   Outcome Measure  Help needed turning from your back to your side while in a flat bed without using bedrails?: Total Help needed moving from lying on your back to sitting on the side of a flat bed without using bedrails?: Total Help needed moving to and from a bed to a chair (including a wheelchair)?: Total Help needed standing up from a chair using your arms (e.g., wheelchair or bedside chair)?: Total Help needed to walk in hospital room?: Total Help needed climbing 3-5 steps with a railing? : Total 6 Click Score: 6    End of Session Equipment Utilized During Treatment: Oxygen Activity Tolerance: Patient limited by lethargy Patient left: in bed;with call bell/phone within reach;with bed alarm set Nurse Communication: Mobility status PT Visit Diagnosis: Other abnormalities of gait and mobility (R26.89);Muscle weakness (generalized) (M62.81);Difficulty in walking, not elsewhere classified (R26.2);Other symptoms and signs involving the nervous system (F29.244)     Time: 6286-3817 PT Time Calculation (min) (ACUTE ONLY): 23 min  Charges:  $Therapeutic Exercise: 8-22 mins                     Moishe Spice, PT, DPT Acute Rehabilitation Services  Pager: [phone] Office: [place] 04/28/2021, 10:55 AM

## 2021-04-28 NOTE — Progress Notes (Signed)
Pt placed on PSV 12/5 per wean protocol. Pt is tolerating well at this time. RT to continue to monitor.

## 2021-04-28 NOTE — Progress Notes (Signed)
Neurosurgery Service Progress Note  Subjective: No acute events overnight  Objective: Vitals:   04/28/21 0500 04/28/21 0600 04/28/21 0700 04/28/21 0749  BP: (!) 142/85 (!) 154/89 (!) 144/91   Pulse: (!) 108 (!) 117 (!) 107 (!) 114  Resp: 15 19 16 18   Temp:      TempSrc:      SpO2: 92% 92% 93% 94%  Weight: 71 kg     Height:        Physical Exam: Intubated, sedation held, eyes open to pain, wiggles toes to command bilaterally, R eye taped shut, L eye PERRL w/ full ROM, no movement BUE   Assessment & Plan: 65 y.o. 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 CTH with pneumocephalus, blood products in the resection cavity, improvement in aqueductal stenosis but persistent supratentorial ventriculomegaly, 5/18 s/p R F EVD for persistent depressed mental status. 5/19 MRI w/ good resection, using rough measurements 86% of tumor volume, expected diffusion along the resection cavity, small dot at the right inf vs sup colliculus, no obvious perforator strokes, 5/23 rpt CTH with resolution of aqueductal stenosis, 5/25 CTH stable ventricles, passed clamp trial, EVD removed  -EVD removed on rounds this morning -CCM recs for vent management -R eye closure improving, can un-tape eye and see if she is able to blink well enough to protect the cornea -SCDs/TEDs, SQH   Judith Part  04/28/21 8:55 AM

## 2021-04-28 NOTE — Procedures (Signed)
Cortrak  Person Inserting Tube:  Esaw Dace, RD Tube Type:  Cortrak - 43 inches Tube Location:  Right nare Initial Placement:  Stomach Secured by: Bridle Technique Used to Measure Tube Placement:  Documented cm marking at nare/ corner of mouth Cortrak Secured At:  65 cm Procedure Comments:  Cortrak Tube Team Note:  Consult received to place a Cortrak feeding tube.   X-ray is required, abdominal x-ray has been ordered by the Cortrak team. Please confirm tube placement before using the Cortrak tube.   If the tube becomes dislodged please keep the tube and contact the Cortrak team at [email or web address] ([email or web address]) for replacement.  If after hours and replacement cannot be delayed, place a NG tube and confirm placement with an abdominal x-ray.    Kerman Passey MS, RDN, LDN, CNSC Registered Dietitian III Clinical Nutrition RD Pager and On-Call Pager Number Located in [place]

## 2021-04-28 NOTE — Progress Notes (Signed)
Orthopedic Tech Progress Note Patient Details:  Tammy Hernandez [birth year] [id number] Called in order to [facility] for BUE RESTING HAND SPLINTS  Patient ID: [name], female   DOB: 1956-04-07, 66 y.o.   MRN: [id number]   Janit Pagan 04/28/2021, 10:03 AM

## 2021-04-29 ENCOUNTER — Inpatient Hospital Stay (HOSPITAL_COMMUNITY): Payer: 59

## 2021-04-29 DIAGNOSIS — Z789 Other specified health status: Secondary | ICD-10-CM

## 2021-04-29 DIAGNOSIS — R509 Fever, unspecified: Secondary | ICD-10-CM

## 2021-04-29 DIAGNOSIS — J9601 Acute respiratory failure with hypoxia: Secondary | ICD-10-CM | POA: Diagnosis not present

## 2021-04-29 LAB — BASIC METABOLIC PANEL
Anion gap: 10 (ref 5–15)
BUN: 49 mg/dL — ABNORMAL HIGH (ref 8–23)
CO2: 30 mmol/L (ref 22–32)
Calcium: 8.5 mg/dL — ABNORMAL LOW (ref 8.9–10.3)
Chloride: 105 mmol/L (ref 98–111)
Creatinine, Ser: 0.61 mg/dL (ref 0.44–1.00)
GFR, Estimated: >60 mL/min (ref >60)
Glucose, Bld: 204 mg/dL — ABNORMAL HIGH (ref 70–99)
Potassium: 4.5 mmol/L (ref 3.5–5.1)
Sodium: 145 mmol/L (ref 135–145)

## 2021-04-29 LAB — URINALYSIS, ROUTINE W REFLEX MICROSCOPIC
Bilirubin Urine: NEGATIVE
Glucose, UA: NEGATIVE mg/dL
Ketones, ur: NEGATIVE mg/dL
Nitrite: NEGATIVE
Protein, ur: 100 mg/dL — AB
Specific Gravity, Urine: 1.034 — ABNORMAL HIGH (ref 1.005–1.030)
WBC, UA: >50 WBC/hpf — ABNORMAL HIGH (ref 0–5)
pH: 5 (ref 5.0–8.0)

## 2021-04-29 LAB — MRSA PCR SCREENING: MRSA by PCR: NEGATIVE

## 2021-04-29 LAB — CBC
HCT: 46 % (ref 36.0–46.0)
Hemoglobin: 14.7 g/dL (ref 12.0–15.0)
MCH: 30.4 pg (ref 26.0–34.0)
MCHC: 32 g/dL (ref 30.0–36.0)
MCV: 95.2 fL (ref 80.0–100.0)
Platelets: 394 10*3/uL (ref 150–400)
RBC: 4.83 MIL/uL (ref 3.87–5.11)
RDW: 13.2 % (ref 11.5–15.5)
WBC: 22.9 10*3/uL — ABNORMAL HIGH (ref 4.0–10.5)
nRBC: 0.2 % (ref 0.0–0.2)

## 2021-04-29 LAB — GLUCOSE, CAPILLARY
Glucose-Capillary: 258 mg/dL — ABNORMAL HIGH (ref 70–99)
Glucose-Capillary: 168 mg/dL — ABNORMAL HIGH (ref 70–99)
Glucose-Capillary: 177 mg/dL — ABNORMAL HIGH (ref 70–99)
Glucose-Capillary: 184 mg/dL — ABNORMAL HIGH (ref 70–99)
Glucose-Capillary: 192 mg/dL — ABNORMAL HIGH (ref 70–99)
Glucose-Capillary: 169 mg/dL — ABNORMAL HIGH (ref 70–99)

## 2021-04-29 LAB — AMMONIA: Ammonia: 75 umol/L — ABNORMAL HIGH (ref 9–35)

## 2021-04-29 LAB — MAGNESIUM: Magnesium: 2.9 mg/dL — ABNORMAL HIGH (ref 1.7–2.4)

## 2021-04-29 LAB — PHOSPHORUS: Phosphorus: 2.5 mg/dL (ref 2.5–4.6)

## 2021-04-29 IMAGING — DX DG CHEST 1V PORT
1 series · 1 of 1 positions shown · non-contrast
Comparison: [DATE]

CLINICAL DATA: Fever

EXAM:
PORTABLE CHEST 1 VIEW

[chest ap]
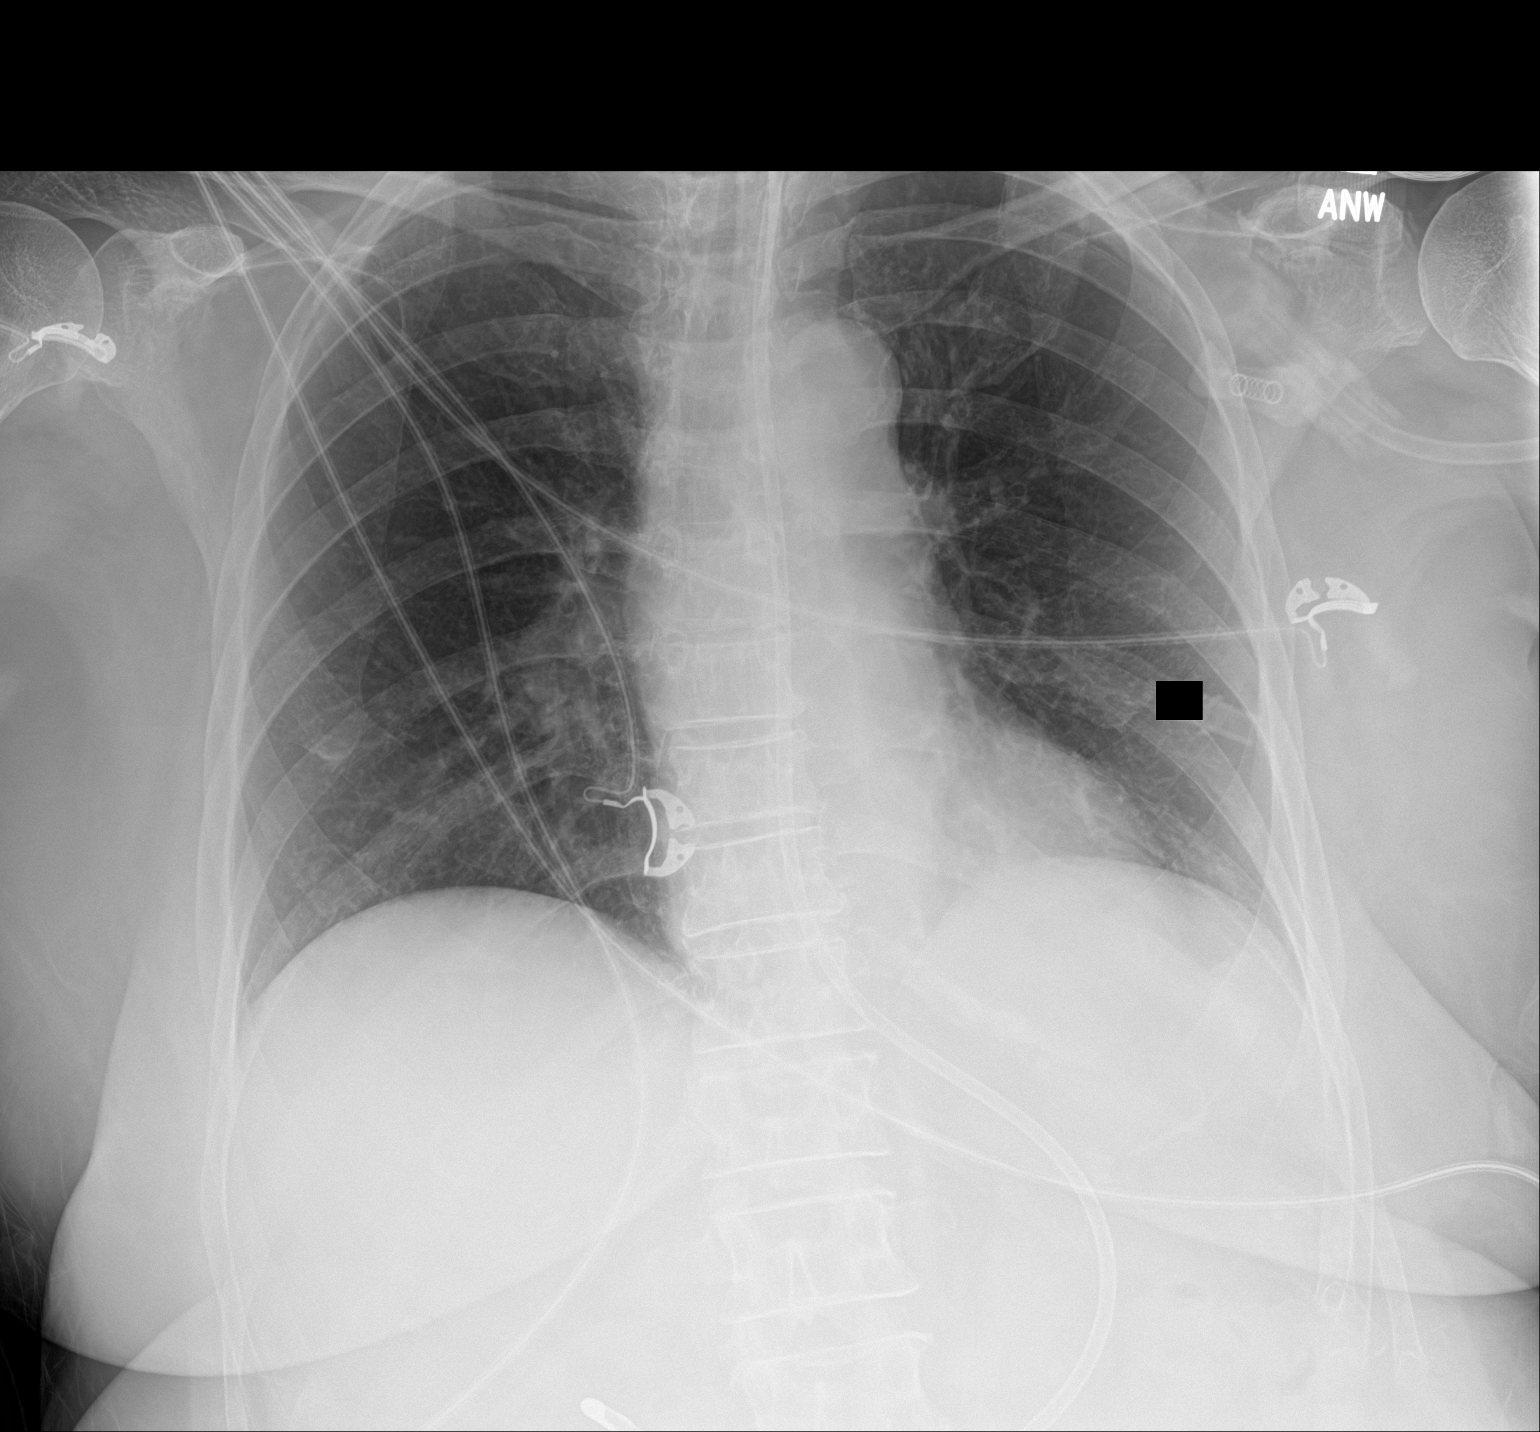

[1 of 1 positions shown; findings below may reference images not displayed]

FINDINGS: Endotracheal and enteric tubes are present. Minimal patchy density
is again identified at the left lung base. No pleural effusion.
Normal heart size.
IMPRESSION: Persistent minimal left basilar atelectasis/consolidation.

## 2021-04-29 IMAGING — CT CT HEAD W/O CM
3 series · 17 of 40 positions shown, 19 images · non-contrast
Comparison: Head CT from yesterday

CLINICAL DATA: Less responsive this morning.

EXAM:
CT HEAD WITHOUT CONTRAST
TECHNIQUE: Contiguous axial images were obtained from the base of the skull
through the vertex without intravenous contrast.

[Series 3: head wo · axial · 0.45mm/px · z∈[-94,+20]mm · 7 of 31 slices shown, 9 images]
[im 4/31  brain]
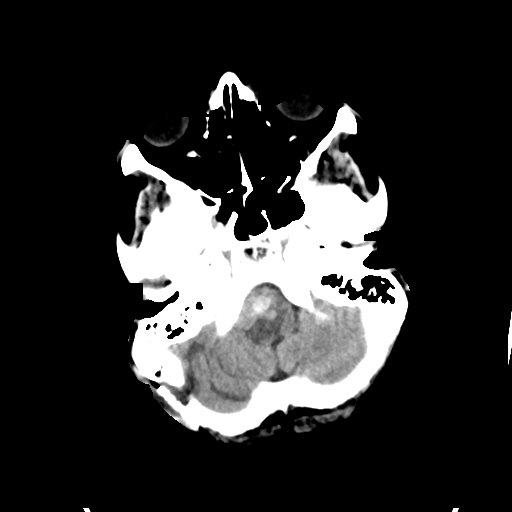
[im 4/31  bone]
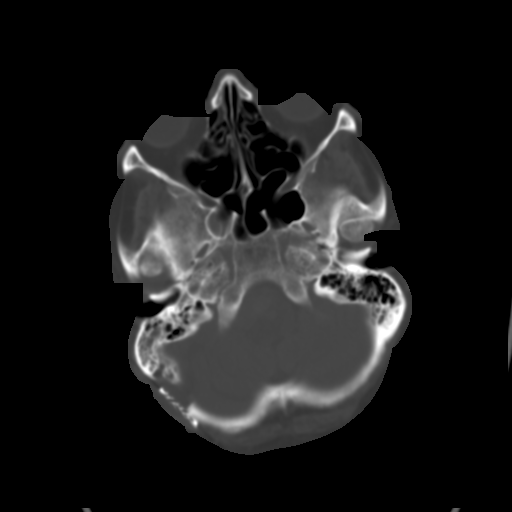
[im 8/31  brain]
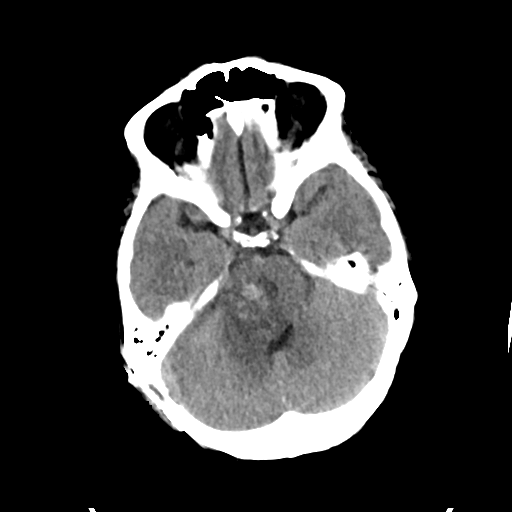
[im 12/31  brain]
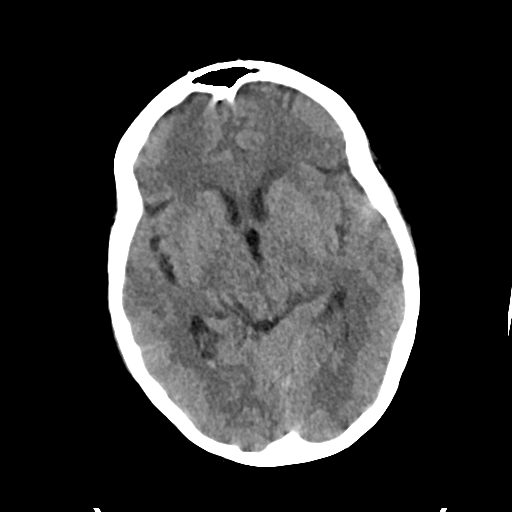
[im 16/31  brain]
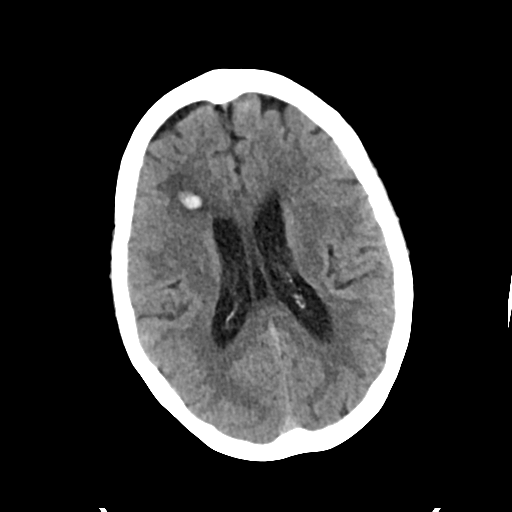
[im 19/31  brain]
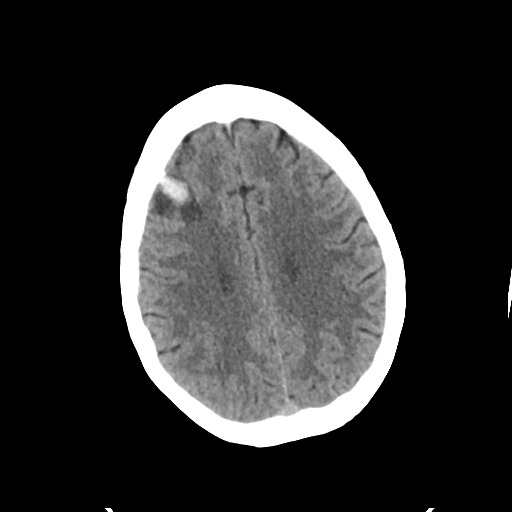
[im 19/31  bone]
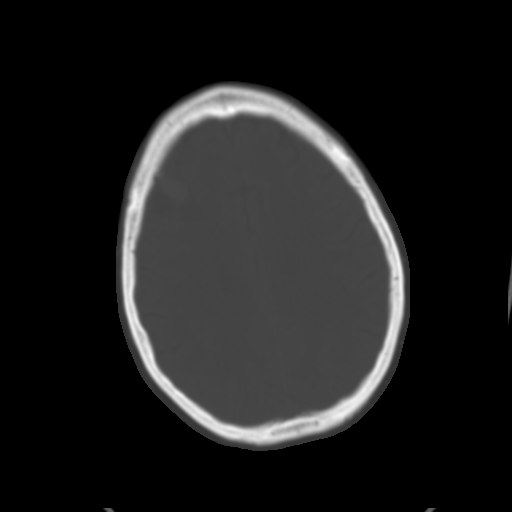
[im 23/31  brain]
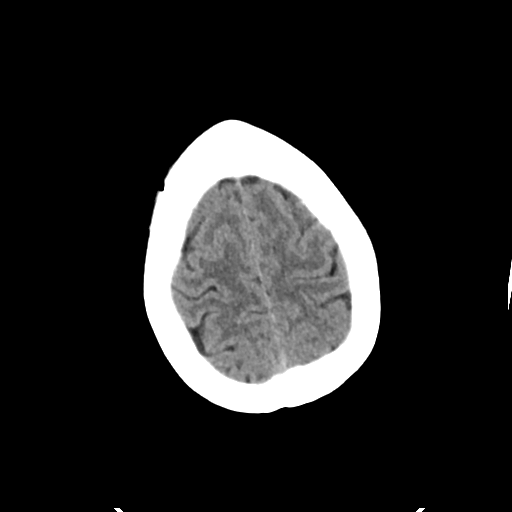
[im 27/31  brain]
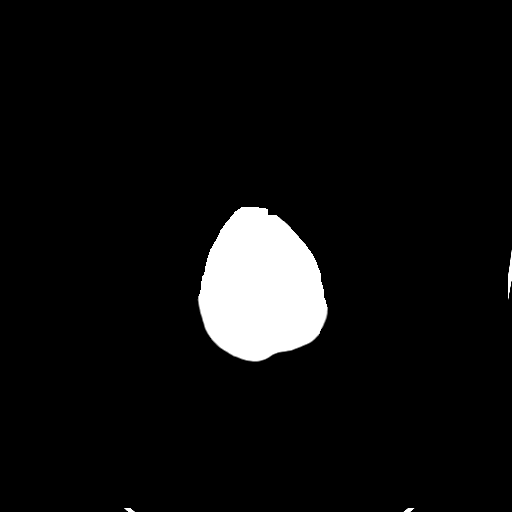

[Series 4: head bone · axial · 0.45mm/px · z∈[-96,+10]mm · 7 of 76 slices shown]
[im 8/76  bone]
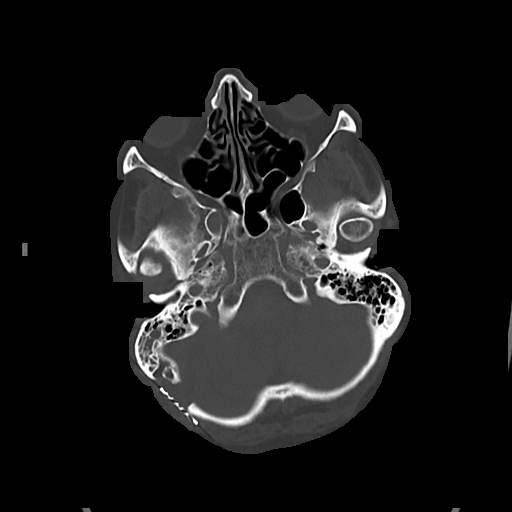
[im 16/76  bone]
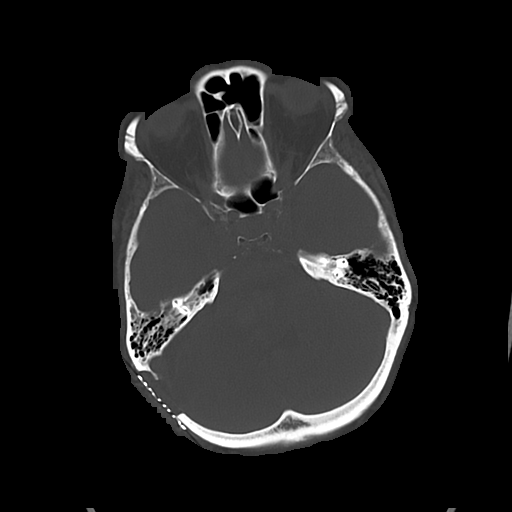
[im 23/76  bone]
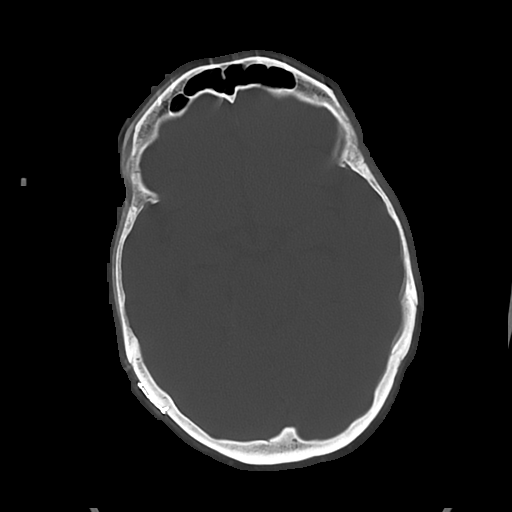
[im 34/76  bone]
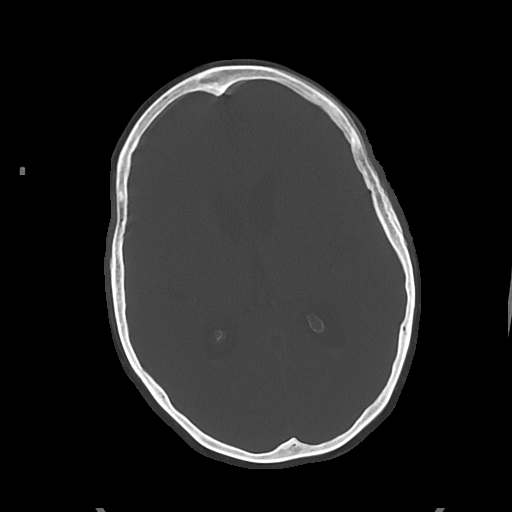
[im 42/76  bone]
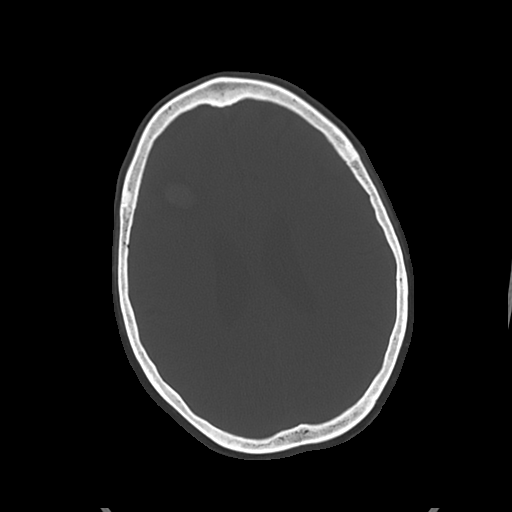
[im 53/76  bone]
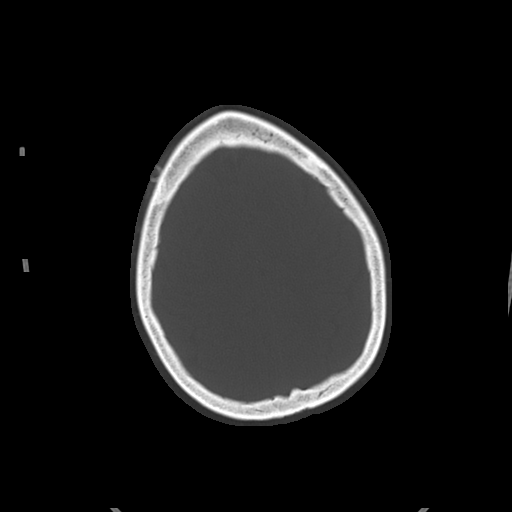
[im 61/76  bone]
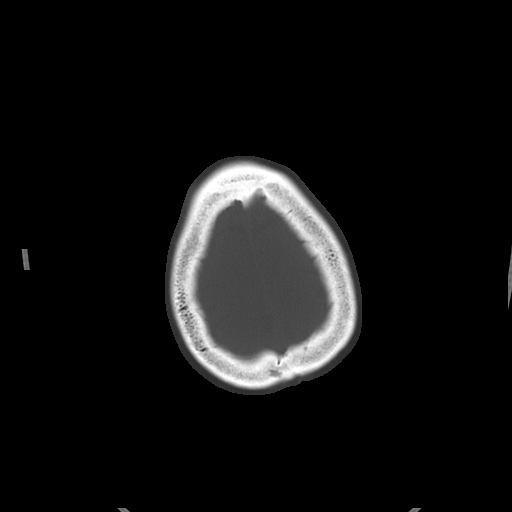

[Series 5: cor soft · coronal · 0.30mm/px · 3 of 72 slices shown]
[im 24/72  brain]
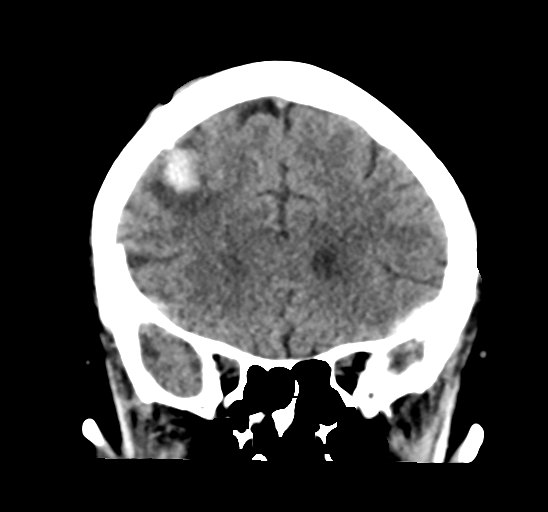
[im 32/72  brain]
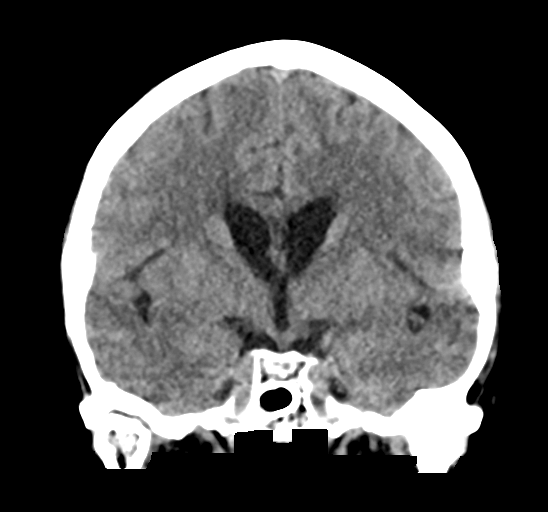
[im 40/72  brain]
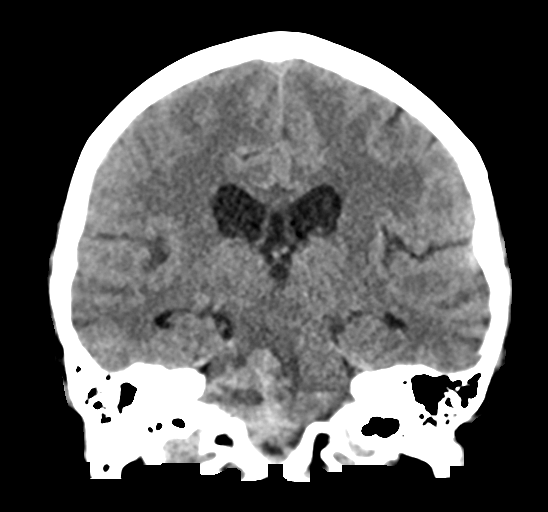

[17 of 40 positions shown; findings below may reference images not displayed]

FINDINGS: Brain: Interval removal of EVD. There is hemorrhage and edema along
the tract with mild progression of the hemorrhage. Small volume
intraventricular hemorrhage which is stable. Stable ventricular
volume with borderline lateral ventriculomegaly. Unchanged edema and
hemorrhage in the right brainstem and middle cerebellar peduncle. No
acute infarct.

Vascular: Unremarkable

Skull: Right retro mastoid craniotomy.

Sinuses/Orbits: Partial opacification of right mastoid air cells
which may have been entered a craniotomy.
IMPRESSION: 1. EVD removal with mild increased hemorrhage along the tract.
2. Stable intraventricular hemorrhage. Stable, normal ventricular
volume.
3. Unchanged edema and hemorrhage in the right brainstem and
cerebellar peduncle.

## 2021-04-29 MED ORDER — LACTATED RINGERS IV BOLUS
500.0000 mL | Freq: Once | INTRAVENOUS | Status: AC
  Administered 2021-04-29: 500 mL via INTRAVENOUS

## 2021-04-29 MED ORDER — SODIUM CHLORIDE 0.9 % IV SOLN
2.0000 g | Freq: Three times a day (TID) | INTRAVENOUS | Status: DC
  Administered 2021-04-29 – 2021-04-30 (×4): 2 g via INTRAVENOUS
  Filled 2021-04-29 (×4): qty 2

## 2021-04-29 MED ORDER — LACTULOSE 10 GM/15ML PO SOLN: 30.0000 g | Freq: Three times a day (TID) | ORAL | Status: DC

## 2021-04-29 MED ORDER — LACTULOSE 10 GM/15ML PO SOLN
30.0000 g | Freq: Three times a day (TID) | ORAL | Status: DC
  Administered 2021-04-29 – 2021-04-30 (×4): 30 g
  Filled 2021-04-29 (×5): qty 45

## 2021-04-29 MED ORDER — INSULIN ASPART 100 UNIT/ML IJ SOLN
0.0000 [IU] | INTRAMUSCULAR | Status: DC
  Administered 2021-04-29 – 2021-04-30 (×8): 3 [IU] via SUBCUTANEOUS
  Administered 2021-05-01: 2 [IU] via SUBCUTANEOUS
  Administered 2021-05-01: 3 [IU] via SUBCUTANEOUS
  Administered 2021-05-01: 2 [IU] via SUBCUTANEOUS
  Administered 2021-05-02: 5 [IU] via SUBCUTANEOUS
  Administered 2021-05-02 (×2): 2 [IU] via SUBCUTANEOUS
  Administered 2021-05-02: 5 [IU] via SUBCUTANEOUS
  Administered 2021-05-02 – 2021-05-03 (×3): 3 [IU] via SUBCUTANEOUS
  Administered 2021-05-03 (×2): 2 [IU] via SUBCUTANEOUS
  Administered 2021-05-03 – 2021-05-04 (×2): 3 [IU] via SUBCUTANEOUS
  Administered 2021-05-04 (×3): 2 [IU] via SUBCUTANEOUS
  Administered 2021-05-05: 3 [IU] via SUBCUTANEOUS
  Administered 2021-05-05 (×3): 2 [IU] via SUBCUTANEOUS

## 2021-04-29 NOTE — Progress Notes (Signed)
RT note. Patient transported to ct and back without any complications.

## 2021-04-29 NOTE — Progress Notes (Signed)
Pt more lethargic, not opening eyes, not following commands.  Margo Aye, NP neuroSx on call notified.  STAT CT head ordered.

## 2021-04-29 NOTE — Progress Notes (Signed)
   04/29/21 0746  Airway 7 mm  Placement Date/Time: 05/03/2021 (c) 1635   Grade View: Grade 2  Airway Device: Endotracheal Tube  Laryngoscope Blade: Miller;2  ETT Types: Oral  Size (mm): 7 mm  Cuffed: Cuffed;Min.occ.pres.  Insertion attempts: 1  Airway Equipment: Stylet  Placement Co...  Secured at (cm) 21 cm  Measured From Lips  Secured Location Left  Secured By Commercial Tube Holder  Tube Holder Repositioned Yes  Prone position No  Cuff Pressure (cm H2O) Green OR 18-26 CmH2O  Site Condition Dry  Adult Ventilator Settings  Vent Type Servo i  Humidity HME  Vent Mode PSV;CPAP  FiO2 (%) 40 %  Pressure Support 12 cmH20  PEEP 5 cmH20  Adult Ventilator Measurements  Peak Airway Pressure 17 L/min  Mean Airway Pressure 9 cmH20  Resp Rate Spontaneous 14 br/min  Resp Rate Total 14 br/min  Spont TV 428 mL  Measured Ve 5.9 mL  Auto PEEP 0 cmH20  Total PEEP 5 cmH20  SpO2 95 %  Adult Ventilator Alarms  Alarms On Y  Ve High Alarm 18 L/min  Ve Low Alarm 4 L/min  Resp Rate High Alarm 38 br/min  Resp Rate Low Alarm 10  PEEP Low Alarm 3 cmH2O  Press High Alarm 45 cmH2O  T Apnea 20 sec(s)  VAP Prevention  HME changed No  Ventilator changed No  Transported while on vent No  HOB> 30 Degrees Y  Equipment wiped down Yes  Daily Weaning Assessment  Daily Assessment of Readiness to Wean Wean protocol criteria met (SBT performed)  SBT Method CPAP 5 cm H20 and PS 5 cm H20 (12/5)  Weaning Start Time 0746  Patient response Passed (Tolerated well)  Breath Sounds  Bilateral Breath Sounds Clear;Diminished  Airway Suctioning/Secretions  Suction Type ETT  Suction Device  Catheter  Secretion Amount Small  Secretion Color White  Secretion Consistency Thick  Suction Tolerance Tolerated well  Suctioning Adverse Effects None   

## 2021-04-29 NOTE — Progress Notes (Signed)
Neurosurgery Service Progress Note  Subjective: No acute events overnight, was less responsive, CTH repeated which showed some tract hemorrhage p EVD removal, but stable vents  Objective: Vitals:   04/29/21 0339 04/29/21 0400 04/29/21 0500 04/29/21 0600  BP: 126/78 131/74 (!) 148/96 (!) 131/25  Pulse: (!) 108 (!) 110 (!) 107 (!) 102  Resp: 16 16 17 17   Temp:      TempSrc:      SpO2: 95% 95% 91% 97%  Weight:   71 kg   Height:        Physical Exam: Intubated, sedation held, eyes open to voice after multiple attempts then keeps them open, wiggles toes to command bilaterally, R eye with good closure but some scleral edema, L eye PERRL w/ full ROM, no movement BUE   Assessment & Plan: 65 y.o. 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 CTH with pneumocephalus, blood products in the resection cavity, improvement in aqueductal stenosis but persistent supratentorial ventriculomegaly, 5/18 s/p R F EVD for persistent depressed mental status. 5/19 MRI w/ good resection, using rough measurements 86% of tumor volume, expected diffusion along the resection cavity, small dot at the right inf vs sup colliculus, no obvious perforator strokes, 5/23 rpt CTH with resolution of aqueductal stenosis, 5/25 CTH stable ventricles, passed clamp trial, EVD removed, 5/26 rpt CTH stable  -CCM recs for vent management -R eye closure improving, can un-tape eye and see if she is able to blink well enough to protect the cornea, continue eye gtt OD -unfortunately, it has been 9d post-op and she's still too weak for weaning, I think we should at least probably start the discussion regarding trach & PEG placement.  -SCDs/TEDs, SQH   Joyice Faster Melanny Wire  04/29/21 7:07 AM

## 2021-04-29 NOTE — Progress Notes (Signed)
Pharmacy Antibiotic Note  Tammy Hernandez is a 65 y.o. female admitted on 04/16/2021 with progressive ataxia, hearing loss and myelopathy.  Underwent retrosigmoid craniotomy for resection of meningioma.  Now with fever and leukocytosis, so Pharmacy has been consulted for cefepime dosing for PNA.  CXR shows persistent atelectasis/consolidation.   Renal function stable, Tmax 100.6, WBC up to 22.9.  Plan: Cefepime 2gm IV Q8H Monitor renal fxn, clinical progress, micro data  Height: 5\' 3"  (160 cm) Weight: 71 kg (156 lb 8.4 oz) IBW/kg (Calculated) : 52.4  Temp (24hrs), Avg:100 F (37.8 C), Min:99.5 F (37.5 C), Max:100.6 F (38.1 C)  Recent Labs  Lab 04/23/21 0648 04/24/21 0159 04/25/21 0300 04/26/21 0434 04/27/21 0104 04/28/21 0151 04/29/21 0309 04/29/21 0439  WBC 14.7*  --   --   --   --   --  22.9*  --   CREATININE 0.54   < > 0.61 0.62 0.65 0.61  --  0.61   < > = values in this interval not displayed.    Estimated Creatinine Clearance: 65.3 mL/min (by C-G formula based on SCr of 0.61 mg/dL).    No Known Allergies  Cefepime 5/26 >>  5/26 MRSA PCR -  5/26 TA -  Tammy Hernandez, PharmD, BCPS, [place] 04/29/2021, 11:13 AM

## 2021-04-29 NOTE — Progress Notes (Signed)
NAME:  Tammy Hernandez MRN:  [id number] DOB:  1956/03/23 LOS: 12 ADMISSION DATE:  04/09/2021 CONSULTATION DATE:  04/24/2021 REFERRING MD:  Zada Finders CHIEF COMPLAINT:  Acute respiratory failure s/p craniotomy  History of Present Illness:  65 year old female with PMHx significant for T2DM, HLD, hypothyroidism and recent diagnosis of petroclival meningoma as evidenced by ataxia, R-sided hearing loss, myelopathy and dysphagia who presented to [facility] 5/17 for R retrosigmoid craniotomy for resection of meningioma.   Patient tolerated procedure well and was extubated in the OR; unfortunately, remained too somnolent to protect her airway and was subsequently reintubated. PCCM consulted for vent management.  Significant Hospital Events: Including procedures, antibiotic start and stop dates in addition to other pertinent events   . 5/17 - POD#0 R craniotomy for petroclival meningioma resection. Extubated in OR, too somnolent to protect her airway, reintubated. Admit to Neuro ICU post-op. . 04/21/2021 interventricular drain to be placed by neurosurgery . 04/21/2021 noted MRI . 04/21/2021 arterial line discontinued . 5/21 - Intermittently following commands . 5/22 - C/f EVD clogging . 5/23 - More awake this AM, following commands in LE. Decreased EVD output. Repeat CT Head to assess ventricles. . 5/24 - CT Head with improved appearance of ventricles per NSGY. Decreased FiO2 need, stable at 40%. Still with waxing/waning mental status. . 5/25 Able to tolerate PSV, still somnolent, EVD removed . 5/26 More somnolent today, CTH without significant changes, concern for developing infection, Cefepime initiated  Interim History / Subjective:  Concern for increasing somnolence this AM, off sedation Repeat head CT ordered by NSGY Fever and tachycardia with increasing WBC, possible infiltrate on CXR, started Cefepime and UA pending  Objective:  Blood pressure (!) 131/25, pulse (!) 102, temperature 99.5 F (37.5 C),  temperature source Axillary, resp. rate 17, height 5\' 3"  (1.6 m), weight 71 kg, SpO2 95 %.    Vent Mode: PSV;CPAP FiO2 (%):  [40 %] 40 % Set Rate:  [16 bmp] 16 bmp Vt Set:  [420 mL] 420 mL PEEP:  [5 cmH20] 5 cmH20 Pressure Support:  [12 cmH20] 12 cmH20 Plateau Pressure:  [14 cmH20-16 cmH20] 14 cmH20   Intake/Output Summary (Last 24 hours) at 04/29/2021 0755 Last data filed at 04/29/2021 0600 Gross per 24 hour  Intake 1200 ml  Output 1300 ml  Net -100 ml   Filed Weights   04/27/21 0500 04/28/21 0500 04/29/21 0500  Weight: 71.5 kg 71 kg 71 kg     General:  Critically ill, intubated F in no distress HEENT: MM pink/moist, ETT in place, sclera anicteric, suctioned moderate amount white, frothy sputum Neuro: somnolent off sedation, will open eyes to sternal rub and triggering breaths on vent, minimal gag, pupils equal and reactive CV: s1s2 tachycardic, regular, no m/r/g PULM:  Bronchial breath sounds and slightly decreased air entry in bilateral bases without significant rhonchi or wheezing. Tolerating SBT this AM despite lethargy GI: soft, bsx4 active  Extremities: warm/dry, no edema  Skin: no rashes or lesions     Labs/imaging that I have personally reviewed: (right click and "Reselect all SmartList Selections" daily)   CBC- WBC 22.9 up from 14.7 BMP- Gluc 204 Mag 2.9 Phos 2.5   CXR-L basilar atelectasis vs consolidation  Resolved Hospital Problem List:   Hyponatremia   Assessment & Plan:   Acute respiratory failure with hypoxia and hypercapnia requiring mechanical ventilation - due to inadequate respiratory drive from postoperative brainstem swelling Intubated and re-intubated 5/17  P: -somnolence remains a barrier to extubation, repeat head CT  without significant changes, off sedation, though able to tolerate PSV again this AM -Husband introduced to idea of trach yesterday and is open to continuing with aggressive care -increased white sputum production today with  low grade fever, leukocytosis and tachycardia.  CXR with persistent L basilar atelectasis/consolidation.   -Respiratory culture and start empiric Cefepime, 500cc LR bolus --Maintain full vent support with SAT/SBT as tolerated -titrate Vent setting to maintain SpO2 greater than or equal to 90%. -HOB elevated 30 degrees. -Plateau pressures less than 30 cm H20.  -Follow chest x-ray, ABG prn.   -Bronchial hygiene and RT/bronchodilator protocol.      Meningioma s/p R craniotomy with retrosigmoid resection of petroclival meningioma Obstructive hydrocephalus s/p EVD Repeat head CT stable today and EVD removed 5/25 by NSGY Completed course of dexamethasone 5/22 P: -management per neurosurgery -continue neuro checks and close monitoring    Hypertension -Continue Labetalol for goal SBP <160    Diabetes mellitus type 2 with hyperglycemia - Continue standing insulin, SSI     Hypothyroidism -continue Synthroid  Best Practice: (right click and "Reselect all SmartList Selections" daily)   Per Primary    Critical care time: 33 minutes   CRITICAL CARE Performed by: Otilio Carpen [name]   Total critical care time: 33 minutes  Critical care time was exclusive of separately billable procedures and treating other patients.  Critical care was necessary to treat or prevent imminent or life-threatening deterioration.  Critical care was time spent personally by me on the following activities: development of treatment plan with patient and/or surrogate as well as nursing, discussions with consultants, evaluation of patient's response to treatment, examination of patient, obtaining history from patient or surrogate, ordering and performing treatments and interventions, ordering and review of laboratory studies, ordering and review of radiographic studies, pulse oximetry and re-evaluation of patient's condition.   Otilio Carpen Jennica Tagliaferri, PA-C [facility] See Amion for  pager If no response to pager , please call [phone] until 7pm After 7:00 pm call Elink  [phone]?[place]

## 2021-04-30 ENCOUNTER — Inpatient Hospital Stay (HOSPITAL_COMMUNITY): Payer: 59

## 2021-04-30 DIAGNOSIS — E870 Hyperosmolality and hypernatremia: Secondary | ICD-10-CM

## 2021-04-30 DIAGNOSIS — G9341 Metabolic encephalopathy: Secondary | ICD-10-CM

## 2021-04-30 DIAGNOSIS — R6520 Severe sepsis without septic shock: Secondary | ICD-10-CM

## 2021-04-30 DIAGNOSIS — K819 Cholecystitis, unspecified: Secondary | ICD-10-CM

## 2021-04-30 DIAGNOSIS — D329 Benign neoplasm of meninges, unspecified: Principal | ICD-10-CM

## 2021-04-30 DIAGNOSIS — J9601 Acute respiratory failure with hypoxia: Secondary | ICD-10-CM | POA: Diagnosis not present

## 2021-04-30 DIAGNOSIS — A419 Sepsis, unspecified organism: Secondary | ICD-10-CM

## 2021-04-30 LAB — COMPREHENSIVE METABOLIC PANEL
ALT: 501 U/L — ABNORMAL HIGH (ref 0–44)
AST: 261 U/L — ABNORMAL HIGH (ref 15–41)
Albumin: 2.4 g/dL — ABNORMAL LOW (ref 3.5–5.0)
Alkaline Phosphatase: 114 U/L (ref 38–126)
Anion gap: 8 (ref 5–15)
BUN: 55 mg/dL — ABNORMAL HIGH (ref 8–23)
CO2: 29 mmol/L (ref 22–32)
Calcium: 8 mg/dL — ABNORMAL LOW (ref 8.9–10.3)
Chloride: 109 mmol/L (ref 98–111)
Creatinine, Ser: 0.75 mg/dL (ref 0.44–1.00)
GFR, Estimated: >60 mL/min (ref >60)
Glucose, Bld: 173 mg/dL — ABNORMAL HIGH (ref 70–99)
Potassium: 4.2 mmol/L (ref 3.5–5.1)
Sodium: 146 mmol/L — ABNORMAL HIGH (ref 135–145)
Total Bilirubin: 0.8 mg/dL (ref 0.3–1.2)
Total Protein: 5 g/dL — ABNORMAL LOW (ref 6.5–8.1)

## 2021-04-30 LAB — CBC WITH DIFFERENTIAL/PLATELET
Abs Immature Granulocytes: 1.09 10*3/uL — ABNORMAL HIGH (ref 0.00–0.07)
Basophils Absolute: 0.1 10*3/uL (ref 0.0–0.1)
Basophils Relative: 0 %
Eosinophils Absolute: 0 10*3/uL (ref 0.0–0.5)
Eosinophils Relative: 0 %
HCT: 48 % — ABNORMAL HIGH (ref 36.0–46.0)
Hemoglobin: 14.6 g/dL (ref 12.0–15.0)
Immature Granulocytes: 4 %
Lymphocytes Relative: 6 %
Lymphs Abs: 1.9 10*3/uL (ref 0.7–4.0)
MCH: 30.1 pg (ref 26.0–34.0)
MCHC: 30.4 g/dL (ref 30.0–36.0)
MCV: 99 fL (ref 80.0–100.0)
Monocytes Absolute: 4.2 10*3/uL — ABNORMAL HIGH (ref 0.1–1.0)
Monocytes Relative: 13 %
Neutro Abs: 24.3 10*3/uL — ABNORMAL HIGH (ref 1.7–7.7)
Neutrophils Relative %: 77 %
Platelets: 391 10*3/uL (ref 150–400)
RBC: 4.85 MIL/uL (ref 3.87–5.11)
RDW: 13.2 % (ref 11.5–15.5)
WBC: 31.6 10*3/uL — ABNORMAL HIGH (ref 4.0–10.5)
nRBC: 0.1 % (ref 0.0–0.2)

## 2021-04-30 LAB — GLUCOSE, CAPILLARY
Glucose-Capillary: 157 mg/dL — ABNORMAL HIGH (ref 70–99)
Glucose-Capillary: 193 mg/dL — ABNORMAL HIGH (ref 70–99)
Glucose-Capillary: 157 mg/dL — ABNORMAL HIGH (ref 70–99)
Glucose-Capillary: 165 mg/dL — ABNORMAL HIGH (ref 70–99)
Glucose-Capillary: 116 mg/dL — ABNORMAL HIGH (ref 70–99)
Glucose-Capillary: 138 mg/dL — ABNORMAL HIGH (ref 70–99)

## 2021-04-30 IMAGING — US US ABDOMEN LIMITED
1 series · 14 of 25 positions shown · non-contrast
Comparison: None.

CLINICAL DATA: Transaminitis

EXAM:
ULTRASOUND ABDOMEN LIMITED RIGHT UPPER QUADRANT

[Series 1: us abdomen limited ruq (liver/gb) · 14 of 34 slices shown]
[im 1/34]
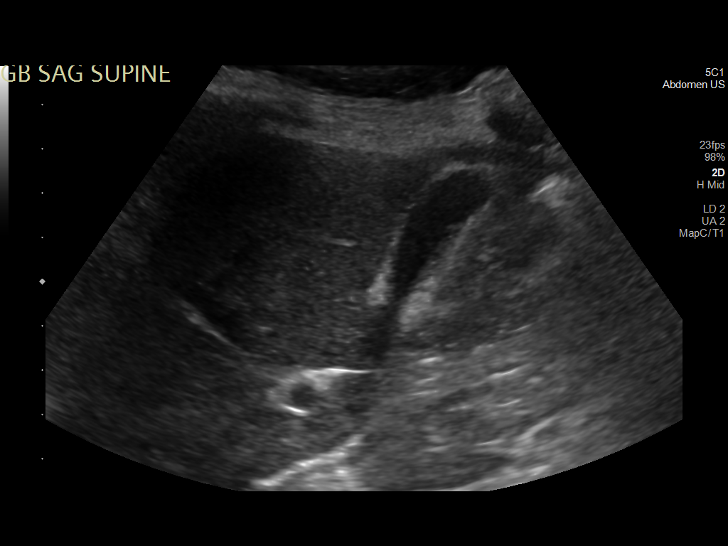
[im 3/34]
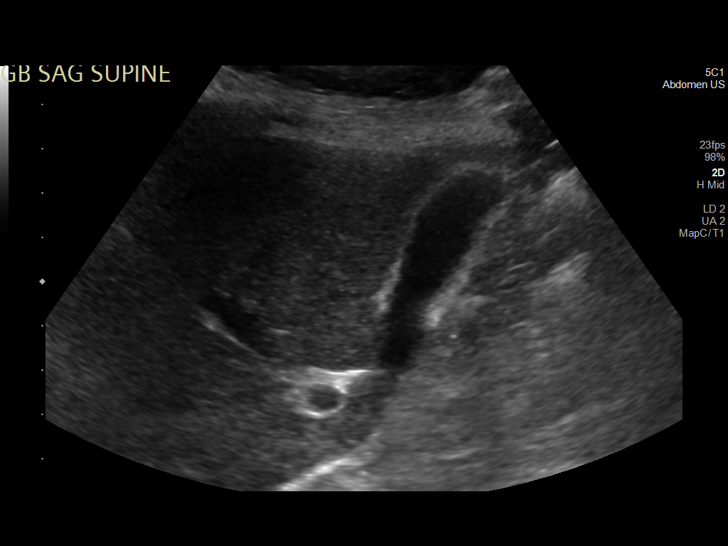
[im 6/34]
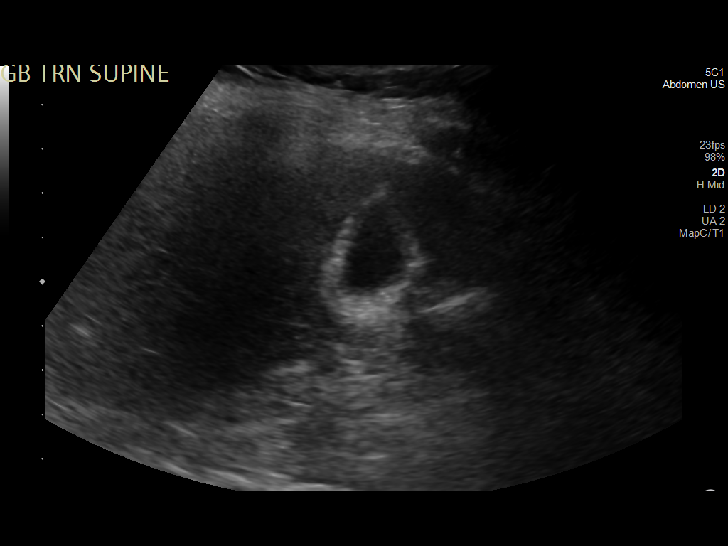
[im 9/34]
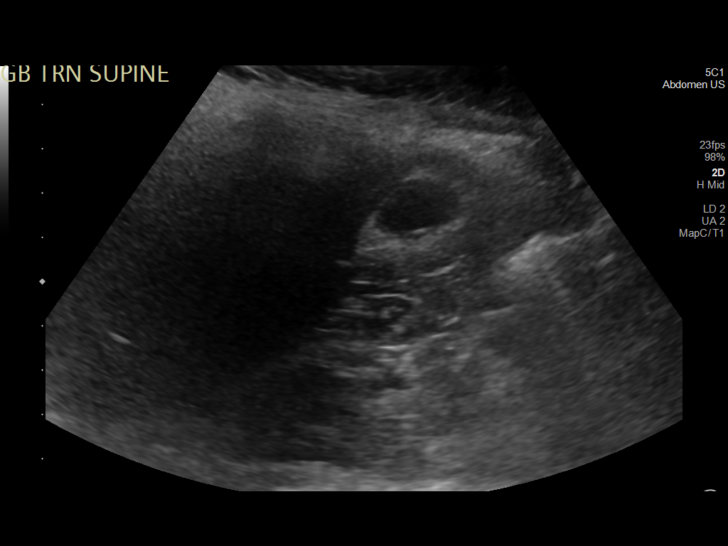
[im 12/34]
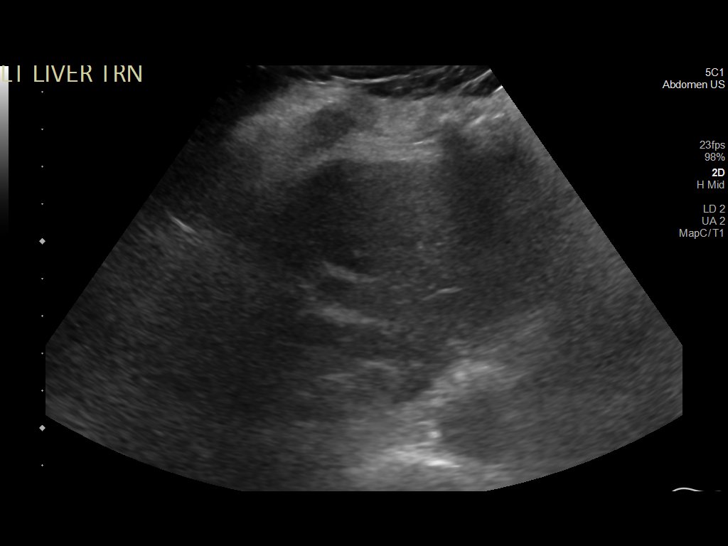
[im 13/34]
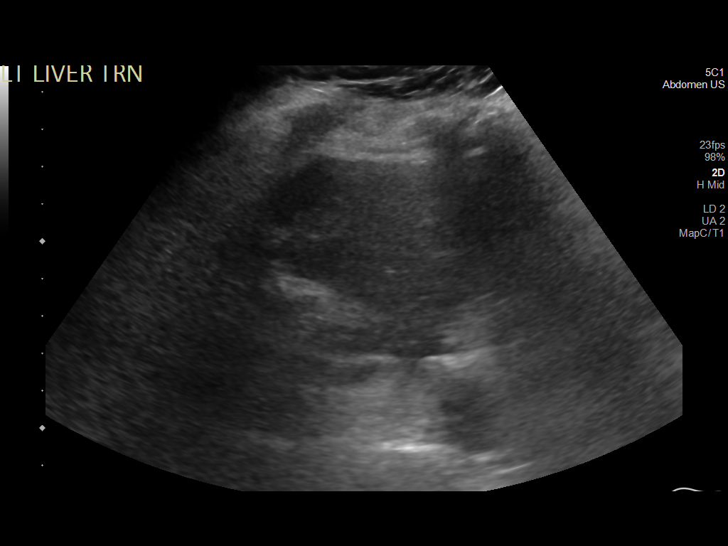
[im 16/34]
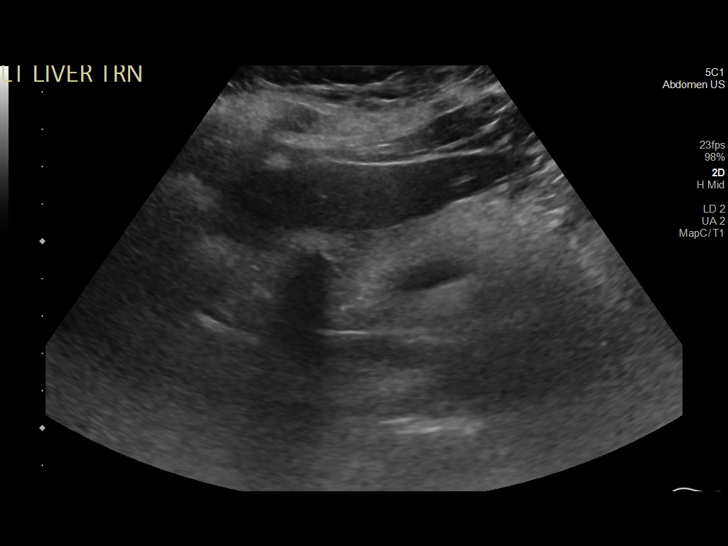
[im 18/34]
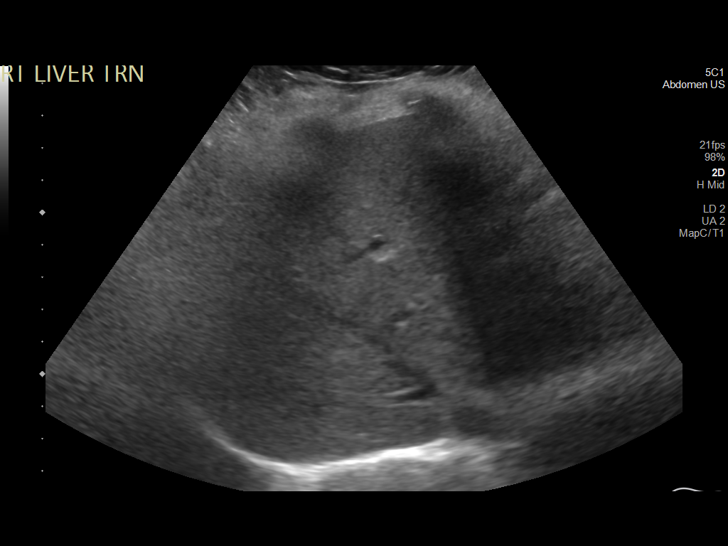
[im 21/34]
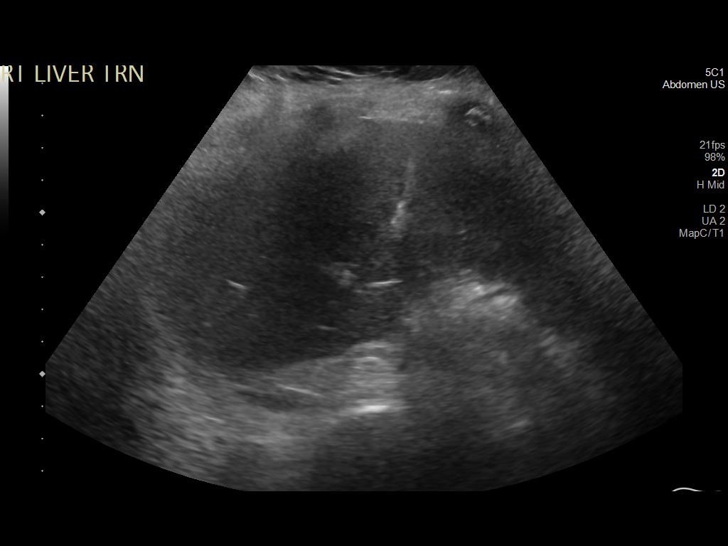
[im 23/34]
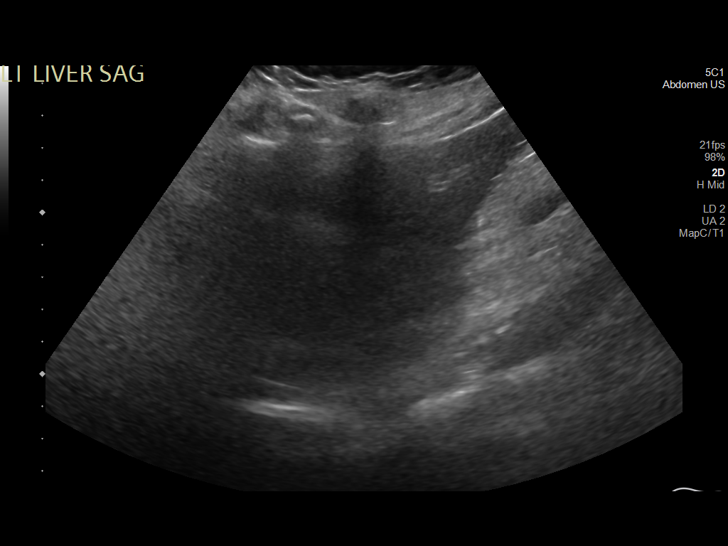
[im 25/34]
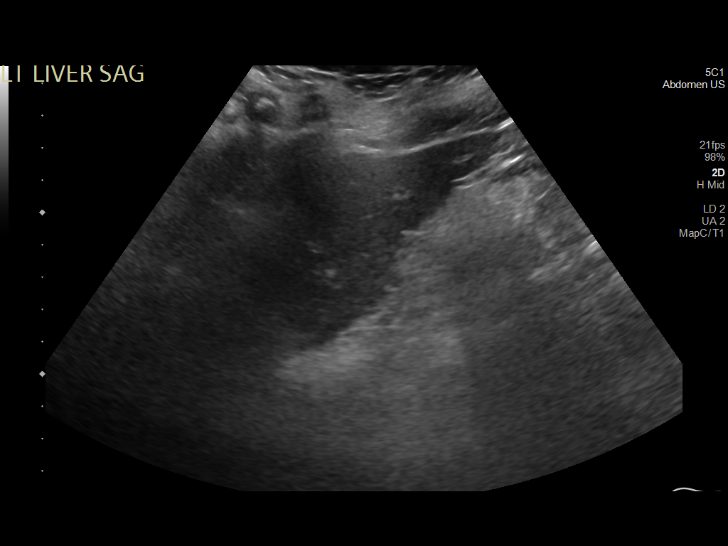
[im 28/34]
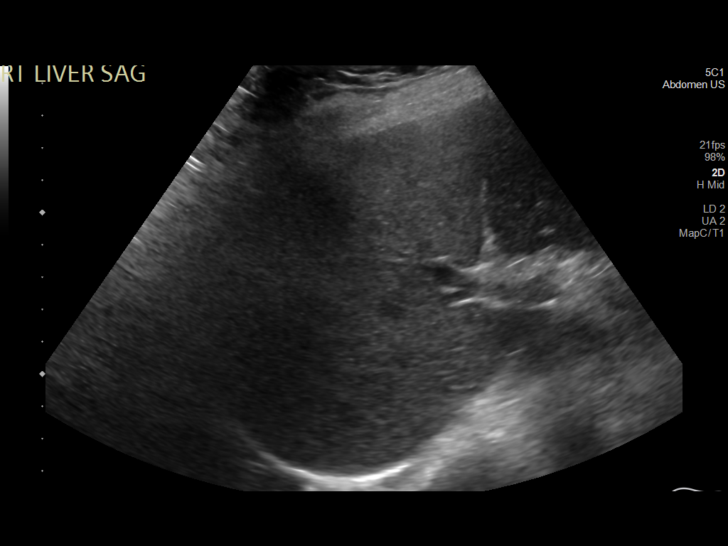
[im 31/34]
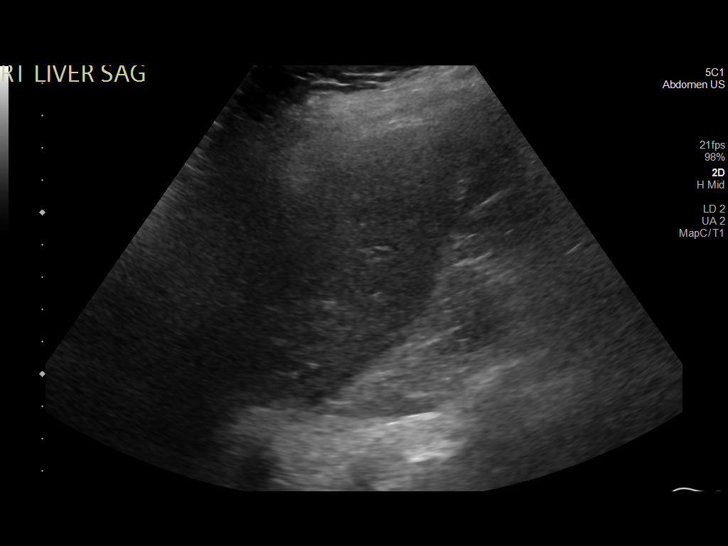
[im 34/34]
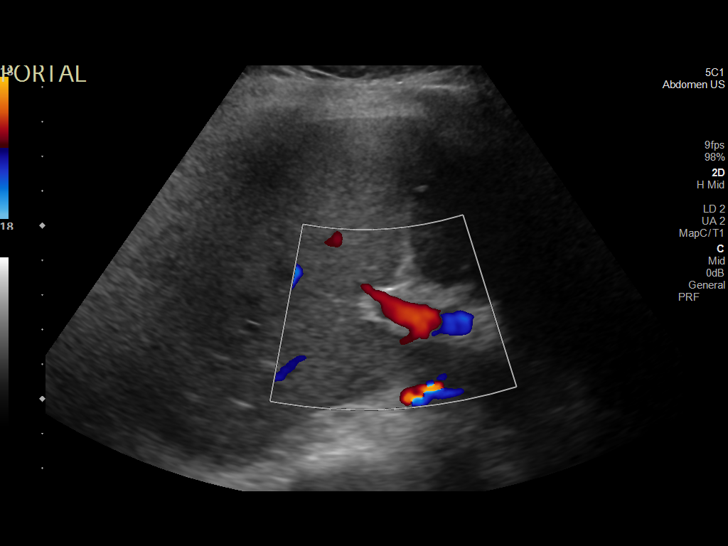

[14 of 25 positions shown; findings below may reference images not displayed]

FINDINGS: Gallbladder:

No gallstones or wall thickening visualized. No sonographic Murphy
sign noted by sonographer.

Common bile duct:

Diameter: 3.5 mm

Liver:

No focal lesion identified on this limited exam. Within normal
limits in parenchymal echogenicity. Portal vein is patent on color
Doppler imaging with normal direction of blood flow towards the
liver.

Other: None.
IMPRESSION: Unremarkable right upper quadrant ultrasound.

## 2021-04-30 IMAGING — DX DG ABD PORTABLE 1V
1 series · 1 of 1 positions shown · non-contrast
Comparison: [DATE]

CLINICAL DATA: Emesis.

EXAM:
PORTABLE ABDOMEN - 1 VIEW

[abdomen supine]
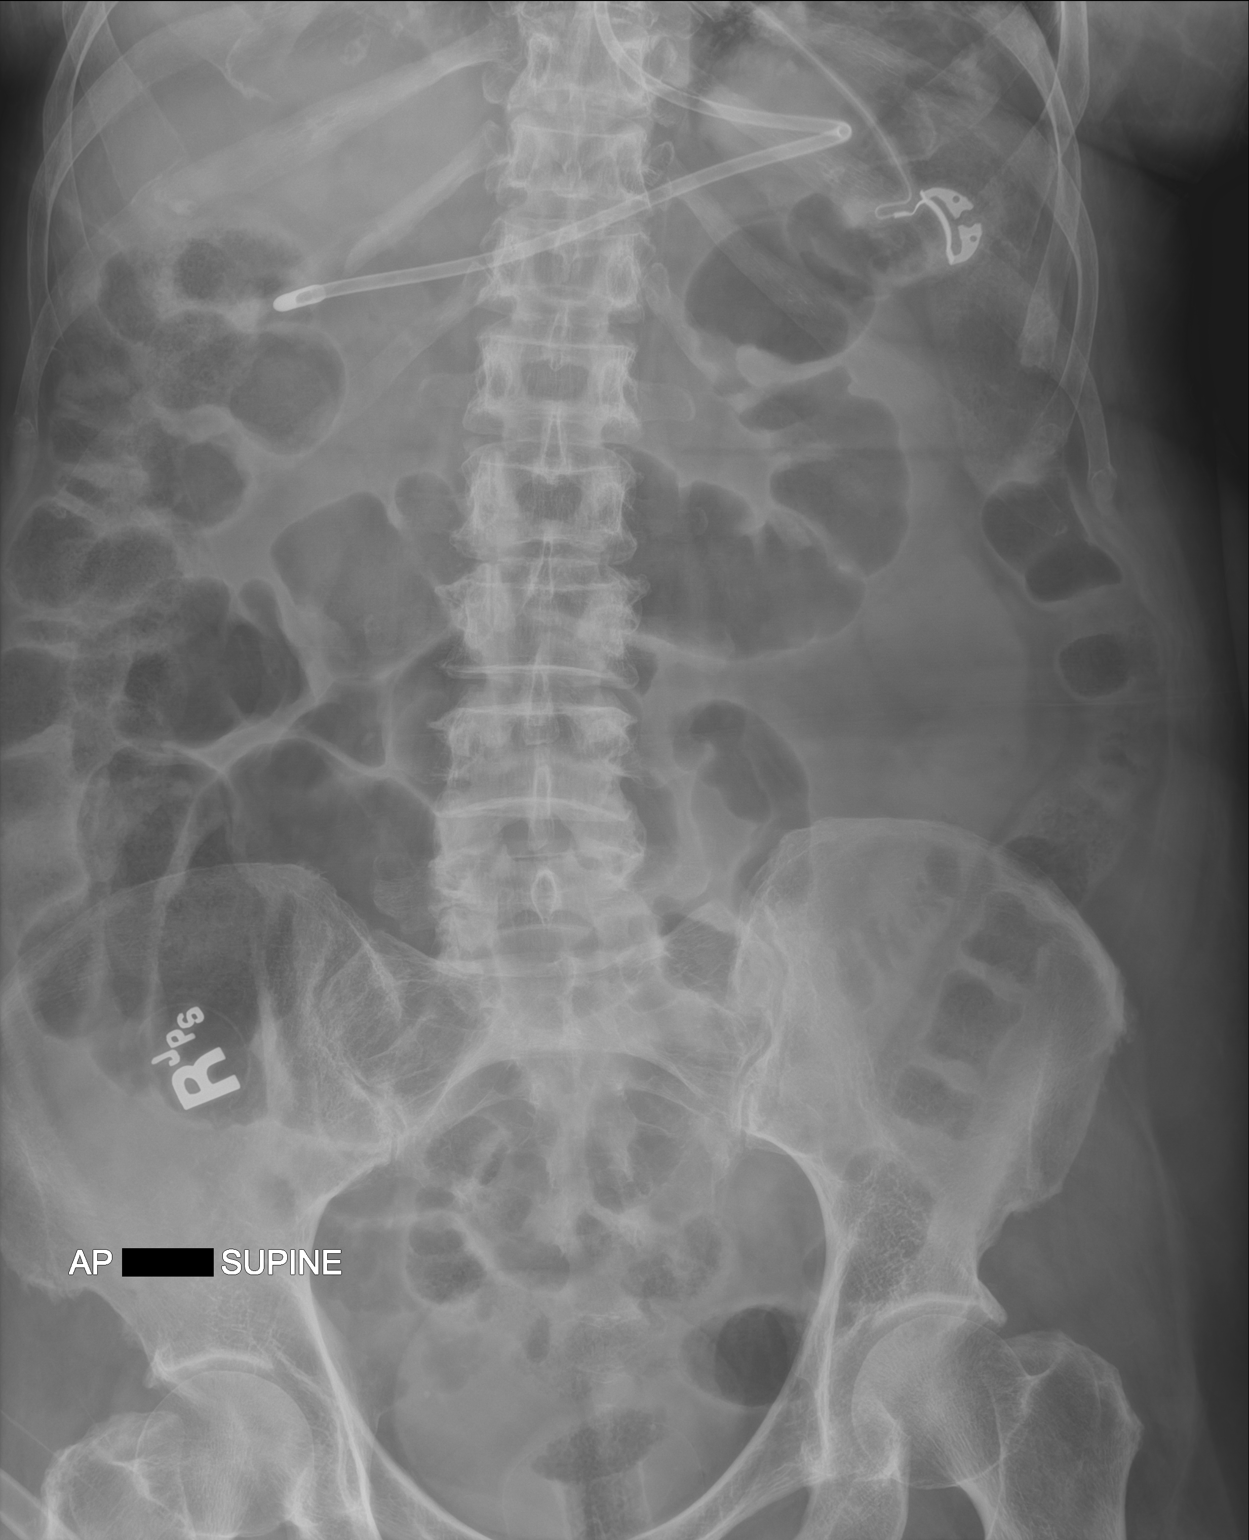

[1 of 1 positions shown; findings below may reference images not displayed]

FINDINGS: The tip of the feeding tube is in the right upper quadrant in the
expected location of the pylorus. Increased colonic gas compared
with the previous exam. No small bowel dilatation.
IMPRESSION: Tip of the enteric tube is in the expected location of the pylorus.

Increased colonic gas compared with previous exam which may reflect
colonic ileus.

## 2021-04-30 MED ORDER — FREE WATER
100.0000 mL | Freq: Four times a day (QID) | Status: DC
  Administered 2021-04-30: 100 mL

## 2021-04-30 MED ORDER — SODIUM CHLORIDE 0.9 % IV SOLN
3.0000 g | Freq: Three times a day (TID) | INTRAVENOUS | Status: DC
  Administered 2021-04-30 – 2021-05-01 (×4): 3 g via INTRAVENOUS
  Filled 2021-04-30: qty 8
  Filled 2021-04-30 (×2): qty 3
  Filled 2021-04-30: qty 8
  Filled 2021-04-30 (×2): qty 3

## 2021-04-30 MED ORDER — CHLORHEXIDINE GLUCONATE CLOTH 2 % EX PADS
6.0000 | MEDICATED_PAD | Freq: Every day | CUTANEOUS | Status: DC
  Administered 2021-05-01 – 2021-05-05 (×5): 6 via TOPICAL

## 2021-04-30 NOTE — Progress Notes (Signed)
Neurosurgery Service Progress Note  Subjective: No acute events overnight  Objective: Vitals:   04/30/21 1000 04/30/21 1055 04/30/21 1100 04/30/21 1200  BP: 132/70  130/80 120/76  Pulse: (!) 113 (!) 118 (!) 118 (!) 117  Resp: (!) 25 20 (!) 24 (!) 9  Temp:      TempSrc:      SpO2: 90% 94% 94% 94%  Weight:      Height:        Physical Exam: Intubated, eyes closed, but open to painful stim, regards on the left with conjugate gaze today, not following commands, R eye with good closure, scleral edema improving   Assessment & Plan: 65 y.o. 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 CTH with pneumocephalus, blood products in the resection cavity, improvement in aqueductal stenosis but persistent supratentorial ventriculomegaly, 5/18 s/p R F EVD for persistent depressed mental status. 5/19 MRI w/ good resection, using rough measurements 86% of tumor volume, expected diffusion along the resection cavity, small dot at the right inf vs sup colliculus, no obvious perforator strokes, 5/23 rpt CTH with resolution of aqueductal stenosis, 5/25 CTH stable ventricles, passed clamp trial, EVD removed, 5/26 rpt CTH stable  -CCM recs -pt with hyperammonemia (being treated) and pneumonia with high WBC, multiple CTs with stable ventricles and good explanation for encephalopathy -discussed w/ pt's daughter today that I think she'll unfortunately require trach/PEG placement, metabolic encephalopathy will take time to improve and she's still quite weak  -SCDs/TEDs, SQH   Judith Part  04/30/21 12:25 PM

## 2021-04-30 NOTE — Progress Notes (Signed)
Patient has jelly like red substance in stool. Amount equal to the size of the palm of hand. Patient has had fever over entire shift. CCM notified. Labs obtained. RN to monitor.

## 2021-04-30 NOTE — Progress Notes (Signed)
Occupational Therapy Treatment Patient Details Name: Tammy Hernandez MRN: [id number] DOB: 28-Oct-1956 Today's Date: 04/30/2021    History of present illness 65 y.o. woman with progressive ataxia, R sided hearing loss, and myelopathy as well as some dysphagia. 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 REintubated in OR due to somnolence. 5/18 Right frontal ventriculostomy with EVD placement. 5/19 MRI w/ good resection, using rough measurements 86% of tumor volume, expected diffusion along the resection cavity, small dot at the right inf vs sup colliculus, no obvious perforator strokes. Marland Kitchen EVD clotted on 5/22. EVD removed on 5/25. PMH - arthritis; pre-diabetic; osteoporosis.   OT comments  Pt continues to present with poor arousal, engagement, and activity tolerance. Pt with leaking of flexiseal and requiring Total A to roll in bed and complete peri care. Pt requiring Total A to transfer to upright posture at EOB and maintain sitting balance and head upright. Pt with elevating HR, drop in SpO2, and then vomiting while EOB. Assisting to clean, notified RN, and return to supine with upright posture. Continue to recommend dc to SNF, will continue to follow acutely, and update frequency to 1xwk.    Follow Up Recommendations  SNF    Equipment Recommendations  Hospital bed    Recommendations for Other Services      Precautions / Restrictions Precautions Precautions: Other (comment) Precaution Comments: intubated ET/ Vent       Mobility Bed Mobility Overal bed mobility: Needs Assistance Bed Mobility: Supine to Sit;Sit to Supine     Supine to sit: Total assist;+2 for physical assistance Sit to supine: Total assist;+2 for physical assistance   General bed mobility comments: Total A for managing trunk and BLEs. No engagement    Transfers                 General transfer comment: Defered for safety    Balance Overall balance assessment: History of Falls (and  ataxia)                                         ADL either performed or assessed with clinical judgement   ADL Overall ADL's : Needs assistance/impaired     Grooming: Oral care;Total assistance;Sitting Grooming Details (indicate cue type and reason): Total A for sitting balance. Total hand over hand for oral care with suction                     Toileting- Clothing Manipulation and Hygiene: Total assistance Toileting - Clothing Manipulation Details (indicate cue type and reason): Total A for peri care       General ADL Comments: total (A) For all     Vision   Additional Comments: Not opening eyes. Not tracking   Perception     Praxis      Cognition Arousal/Alertness: Lethargic Behavior During Therapy: Flat affect Overall Cognitive Status: Difficult to assess                                 General Comments: not following any commands. response to painful stimuli in L LE only, blinks to threat only        Exercises Exercises: Other exercises Other Exercises Other Exercises: BUE PROM Other Exercises: Gentle cervical extension PROM   Shoulder Instructions       General Comments HR  elevaitng to 120-130s with vomiting whiel upright at EOB    Pertinent Vitals/ Pain       Pain Assessment: Faces Faces Pain Scale: No hurt Pain Intervention(s): Monitored during session;Limited activity within patient's tolerance;Repositioned  Home Living                                          Prior Functioning/Environment              Frequency  Min 1X/week        Progress Toward Goals  OT Goals(current goals can now be found in the care plan section)  Progress towards OT goals: Not progressing toward goals - comment  Acute Rehab OT Goals Patient Stated Goal: pt unable to participate OT Goal Formulation: With family Time For Goal Achievement: 05/06/21 Potential to Achieve Goals: Good ADL Goals Additional  ADL Goal #1: Pt will follow 1 step commands consistently in nondistracting environment Additional ADL Goal #2: Pt will complete hand to mouth pattern with min vc in preparation for ADL activities  Plan Discharge plan remains appropriate;Frequency needs to be updated    Co-evaluation    PT/OT/SLP Co-Evaluation/Treatment: Yes Reason for Co-Treatment: For patient/therapist safety;To address functional/ADL transfers   OT goals addressed during session: ADL's and self-care      AM-PAC OT "6 Clicks" Daily Activity     Outcome Measure   Help from another person eating meals?: Total Help from another person taking care of personal grooming?: Total Help from another person toileting, which includes using toliet, bedpan, or urinal?: Total Help from another person bathing (including washing, rinsing, drying)?: Total Help from another person to put on and taking off regular upper body clothing?: Total Help from another person to put on and taking off regular lower body clothing?: Total 6 Click Score: 6    End of Session Equipment Utilized During Treatment: Oxygen (vent)  OT Visit Diagnosis: Other abnormalities of gait and mobility (R26.89);Muscle weakness (generalized) (M62.81);History of falling (Z91.81);Low vision, both eyes (H54.2);Ataxia, unspecified (R27.0);Other symptoms and signs involving cognitive function   Activity Tolerance Patient limited by lethargy   Patient Left in bed;with call bell/phone within reach;with bed alarm set   Nurse Communication Mobility status        Time: 9326-7124 OT Time Calculation (min): 40 min  Charges: OT General Charges $OT Visit: 1 Visit OT Treatments $Therapeutic Activity: 8-22 mins  [place], OTR/L Acute Rehab Pager: [phone] Office: [place] 04/30/2021, 5:58 PM

## 2021-04-30 NOTE — Progress Notes (Signed)
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   Patient tolerated procedure well and was extubated in the OR; unfortunately, remained too somnolent to protect her airway and was subsequently reintubated. PCCM consulted for vent management.  Significant Hospital Events: Including procedures, antibiotic start and stop dates in addition to other pertinent events   . 5/17 - POD#0 R craniotomy for petroclival meningioma resection. Extubated in OR, too somnolent to protect her airway, reintubated. Admit to Neuro ICU post-op. . 04/21/2021 interventricular drain to be placed by neurosurgery . 04/21/2021 noted MRI . 04/21/2021 arterial line discontinued . 5/21 - Intermittently following commands . 5/22 - C/f EVD clogging . 5/23 - More awake this AM, following commands in LE. Decreased EVD output. Repeat CT Head to assess ventricles. . 5/24 - CT Head with improved appearance of ventricles per NSGY. Decreased FiO2 need, stable at 40%. Still with waxing/waning mental status. . 5/25 Able to tolerate PSV, still somnolent, EVD removed . 5/26 More somnolent today, CTH without significant changes, concern for developing infection, Cefepime initiated . 5/27 continued poor neuro status, increased WBC, respiratory culture with gm neg rods and few gm positives, but MRSA neg, narrow to unasyn  Interim History / Subjective:  Continued somnolence, responsive only to pain off sedation several days  Continued low grade fevers with WBC uptrending Respiratory  culture growing gm neg rods Started on lactulose yesterday for elevated ammonia   Had an episode of vomiting, TF held  Objective:  Blood pressure 125/86, pulse (!) 105, temperature 97.7 F (36.5 C), temperature source Axillary, resp. rate (!) 25, height 5\' 3"  (1.6 m), weight 70.7 kg, SpO2 91 %.    Vent Mode: CPAP;PSV FiO2 (%):  [40 %] 40 % Set Rate:  [16 bmp] 16 bmp Vt Set:  [420 mL] 420 mL PEEP:  [5 cmH20] 5 cmH20 Pressure Support:  [12 cmH20] 12 cmH20 Plateau Pressure:  [17 cmH20-19 cmH20] 17 cmH20   Intake/Output Summary (Last 24 hours) at 04/30/2021 7619 Last data filed at 04/30/2021 0800 Gross per 24 hour  Intake 2000.37 ml  Output 1150 ml  Net 850.37 ml   Filed Weights   04/28/21 0500 04/29/21 0500 04/30/21 0358  Weight: 71 kg 71 kg 70.7 kg     General:  Critically ill intubated F, somnolent and in no distress HEENT: MM pink/moist, ETT in place, thick, white sputum suctioned, R scleral edema Neuro: off sedation, unresponsive except to grimace to pain, triggers breaths of vent, pupils 61mm and responsive bilaterally, minimal gag, + corneals CV: s1s2 tachycardic, regular, no m/r/g PULM:  Bronchial breath sounds bilaterally, on PSV 40% and 12/5 GI: soft, bsx4 active  Extremities: warm/dry, no edema  Skin: no rashes or lesions      Labs/imaging that I have personally reviewed: (right click and "Reselect all SmartList Selections" daily)   CBC-WBC increased to 31k today CMP-creatinine remains WNL, LFT's remain mildly elevated, bili normal    Resolved Hospital Problem List:   Hyponatremia   Assessment & Plan:   Acute respiratory failure with hypoxia and hypercapnia requiring mechanical ventilation  Metabolic encephalopathy due  to inadequate respiratory drive from postoperative brainstem swelling Intubated and re-intubated 5/17  P: -remains somnolent without sedation, will likely require trach/peg, has been discussed with husband and with patient's daughter  and they are amenable --Maintain full vent support with SAT/SBT as tolerated -titrate Vent setting to maintain SpO2 greater than or equal to 90%. -HOB elevated 30 degrees. -Plateau pressures less than 30 cm H20.  -Follow chest x-ray, ABG prn.   -Bronchial hygiene and RT/bronchodilator protocol. -Lactulose for hyperammonemia  Fever, Leukocytosis Possible aspiration, respiratory culture growing gm neg rods UA with bacteria P: -Narrow to Unasyn, follow speciation of respiratory culture -Is not in shock, if fever/hypotension worsens then would draw blood cultures    Elevated Transaminases Unclear etiology Ammonia 75  P: -continue to trend, Lactulose initiated -in the setting of fever could consider RUQ [place] to evaluate for acalculous cholecystitis if fever persists and LFT's up-trend    Hypernatremia Mild, Na 146 P: -start free water flushes 100cc q6hr   Meningioma s/p R craniotomy with retrosigmoid resection of petroclival meningioma Obstructive hydrocephalus s/p EVD Repeat head CT stable today and EVD removed 5/25 by NSGY Completed course of dexamethasone 5/22 P: -management per neurosurgery -continue neuro checks and close monitoring    Hypertension -Continue Labetalol for goal SBP <160    Diabetes mellitus type 2 with hyperglycemia - Continue standing insulin, SSI   Hypothyroidism -continue Synthroid  Best Practice: (right click and "Reselect all SmartList Selections" daily)   Per Primary    Critical care time: 40 minutes   CRITICAL CARE Performed by: Otilio Carpen Iam Lipson   Total critical care time: 40 minutes  Critical care time was exclusive of separately billable procedures and treating other patients.  Critical care was necessary to treat or prevent imminent or life-threatening deterioration.  Critical care was time spent personally by me on the following activities: development of treatment plan with patient and/or surrogate as well as nursing,  discussions with consultants, evaluation of patient's response to treatment, examination of patient, obtaining history from patient or surrogate, ordering and performing treatments and interventions, ordering and review of laboratory studies, ordering and review of radiographic studies, pulse oximetry and re-evaluation of patient's condition.   Otilio Carpen Heather Mckendree, PA-C [facility] See Amion for pager If no response to pager , please call [phone] until 7pm After 7:00 pm call Elink  [phone]

## 2021-04-30 NOTE — Progress Notes (Addendum)
Physical Therapy Treatment Patient Details Name: Tammy Hernandez MRN: [id number] DOB: 04-12-1956 Today's Date: 04/30/2021    History of Present Illness 65 y.o. woman with progressive ataxia, R sided hearing loss, and myelopathy as well as some dysphagia. 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 REintubated in OR due to somnolence. 5/18 Right frontal ventriculostomy with EVD placement. 5/19 MRI w/ good resection, using rough measurements 86% of tumor volume, expected diffusion along the resection cavity, small dot at the right inf vs sup colliculus, no obvious perforator strokes. Marland Kitchen EVD clotted on 5/22. EVD removed on 5/25. PMH - arthritis; pre-diabetic; osteoporosis.    PT Comments    Pt continues to present with poor arousal, engagement, and activity tolerance. Pt not responding to any stimuli at all 4 limbs, face, or trunk with muscle activation, but did note a slight increase in HR with noxious stimuli at limbs. Performed PROM to UEs and neck. Noted gurgled breathing sitting EOB, thus provided gentle cupped percussion and vibration technique to her posterior superior bil rib cage, with noted less gurgling. Pt with leaking of flexiseal and requiring Total A to roll in bed and complete peri care. Pt requiring Total A to transfer to upright posture at EOB and maintain sitting balance and head upright. Pt with elevating HR, drop in SpO2, and then bout of emesis while EOB. Assisting to clean, notified RN, and return to supine with upright posture. Changing d/c recs to SNF due to lack of progress and recent decline in function and updated frequency to 2x/week. May change recs and frequency as her condition changes.    Follow Up Recommendations  SNF     Equipment Recommendations  None recommended by PT (defer to next venue and continued OOB mobility)    Recommendations for Other Services       Precautions / Restrictions Precautions Precautions: Other (comment) Precaution  Comments: intubated ET/ Vent; rectal tube Restrictions Weight Bearing Restrictions: No    Mobility  Bed Mobility Overal bed mobility: Needs Assistance Bed Mobility: Supine to Sit;Sit to Supine     Supine to sit: Total assist;+2 for physical assistance Sit to supine: Total assist;+2 for physical assistance   General bed mobility comments: Total A for managing trunk and BLEs. No engagement    Transfers                 General transfer comment: Defered for safety  Ambulation/Gait             General Gait Details: Unable   Stairs             Wheelchair Mobility    Modified Rankin (Stroke Patients Only) Modified Rankin (Stroke Patients Only) Pre-Morbid Rankin Score: No significant disability Modified Rankin: Severe disability     Balance Overall balance assessment: History of Falls (and ataxia)                                          Cognition Arousal/Alertness: Lethargic Behavior During Therapy: Flat affect Overall Cognitive Status: Difficult to assess                                 General Comments: not following any commands. No muscular response to painful stimuli at any limbs, but HR increased to low 120s. blinks to threat only  Exercises Other Exercises Other Exercises: BUE PROM Other Exercises: Gentle cupped percussion at pt's bil superior posterior rib cages with gentle manual vibration to try to improve pt's airway clearance to pt tolerance monitoring vitals Other Exercises: Gentle cervical extension PROM    General Comments General comments (skin integrity, edema, etc.): HR elevating to 120-130s with vomiting while upright at EOB, SpO2 as low as 87% but quickly rebounds to low 90s%      Pertinent Vitals/Pain Pain Assessment: Faces Faces Pain Scale: No hurt Pain Intervention(s): Monitored during session    Home Living                      Prior Function            PT Goals  (current goals can now be found in the care plan section) Acute Rehab PT Goals Patient Stated Goal: pt unable to participate PT Goal Formulation: Patient unable to participate in goal setting Time For Goal Achievement: 05/06/21 Potential to Achieve Goals: Fair Progress towards PT goals: Not progressing toward goals - comment (lethargic)    Frequency    Min 2X/week      PT Plan Frequency needs to be updated;Discharge plan needs to be updated    Co-evaluation PT/OT/SLP Co-Evaluation/Treatment: Yes Reason for Co-Treatment: For patient/therapist safety;To address functional/ADL transfers PT goals addressed during session: Mobility/safety with mobility OT goals addressed during session: ADL's and self-care      AM-PAC PT "6 Clicks" Mobility   Outcome Measure  Help needed turning from your back to your side while in a flat bed without using bedrails?: Total Help needed moving from lying on your back to sitting on the side of a flat bed without using bedrails?: Total Help needed moving to and from a bed to a chair (including a wheelchair)?: Total Help needed standing up from a chair using your arms (e.g., wheelchair or bedside chair)?: Total Help needed to walk in hospital room?: Total Help needed climbing 3-5 steps with a railing? : Total 6 Click Score: 6    End of Session Equipment Utilized During Treatment: Oxygen Activity Tolerance: Patient limited by lethargy Patient left: in bed;with call bell/phone within reach;with bed alarm set;Other (comment);with SCD's reapplied (with bil PRAFOs donned) Nurse Communication: Mobility status;Other (comment) (bout of emesis, rectal tube leakage) PT Visit Diagnosis: Other abnormalities of gait and mobility (R26.89);Muscle weakness (generalized) (M62.81);Difficulty in walking, not elsewhere classified (R26.2);Other symptoms and signs involving the nervous system (R29.898)     Time: 8841-6606 PT Time Calculation (min) (ACUTE ONLY): 42  min  Charges:  $Therapeutic Exercise: 23-37 mins                     Moishe Spice, PT, DPT Acute Rehabilitation Services  Pager: [phone] Office: [place] 04/30/2021, 6:47 PM

## 2021-05-01 ENCOUNTER — Inpatient Hospital Stay (HOSPITAL_COMMUNITY): Payer: 59

## 2021-05-01 DIAGNOSIS — J9601 Acute respiratory failure with hypoxia: Secondary | ICD-10-CM | POA: Diagnosis not present

## 2021-05-01 DIAGNOSIS — A419 Sepsis, unspecified organism: Secondary | ICD-10-CM | POA: Diagnosis not present

## 2021-05-01 DIAGNOSIS — Z7189 Other specified counseling: Secondary | ICD-10-CM

## 2021-05-01 DIAGNOSIS — K567 Ileus, unspecified: Secondary | ICD-10-CM | POA: Diagnosis not present

## 2021-05-01 DIAGNOSIS — R7401 Elevation of levels of liver transaminase levels: Secondary | ICD-10-CM

## 2021-05-01 DIAGNOSIS — R509 Fever, unspecified: Secondary | ICD-10-CM | POA: Diagnosis not present

## 2021-05-01 DIAGNOSIS — D496 Neoplasm of unspecified behavior of brain: Secondary | ICD-10-CM | POA: Diagnosis not present

## 2021-05-01 LAB — COMPREHENSIVE METABOLIC PANEL
ALT: 437 U/L — ABNORMAL HIGH (ref 0–44)
AST: 154 U/L — ABNORMAL HIGH (ref 15–41)
Albumin: 2.3 g/dL — ABNORMAL LOW (ref 3.5–5.0)
Alkaline Phosphatase: 102 U/L (ref 38–126)
Anion gap: 14 (ref 5–15)
BUN: 70 mg/dL — ABNORMAL HIGH (ref 8–23)
CO2: 24 mmol/L (ref 22–32)
Calcium: 8.4 mg/dL — ABNORMAL LOW (ref 8.9–10.3)
Chloride: 112 mmol/L — ABNORMAL HIGH (ref 98–111)
Creatinine, Ser: 1 mg/dL (ref 0.44–1.00)
GFR, Estimated: >60 mL/min (ref >60)
Glucose, Bld: 118 mg/dL — ABNORMAL HIGH (ref 70–99)
Potassium: 5.1 mmol/L (ref 3.5–5.1)
Sodium: 150 mmol/L — ABNORMAL HIGH (ref 135–145)
Total Bilirubin: 0.6 mg/dL (ref 0.3–1.2)
Total Protein: 4.9 g/dL — ABNORMAL LOW (ref 6.5–8.1)

## 2021-05-01 LAB — CULTURE, RESPIRATORY W GRAM STAIN: Culture: NORMAL

## 2021-05-01 LAB — CBC
HCT: 51.1 % — ABNORMAL HIGH (ref 36.0–46.0)
Hemoglobin: 15.8 g/dL — ABNORMAL HIGH (ref 12.0–15.0)
MCH: 30.4 pg (ref 26.0–34.0)
MCHC: 30.9 g/dL (ref 30.0–36.0)
MCV: 98.3 fL (ref 80.0–100.0)
Platelets: 352 10*3/uL (ref 150–400)
RBC: 5.2 MIL/uL — ABNORMAL HIGH (ref 3.87–5.11)
RDW: 13.3 % (ref 11.5–15.5)
WBC: 39.9 10*3/uL — ABNORMAL HIGH (ref 4.0–10.5)
nRBC: 0.2 % (ref 0.0–0.2)

## 2021-05-01 LAB — AMMONIA: Ammonia: 52 umol/L — ABNORMAL HIGH (ref 9–35)

## 2021-05-01 LAB — GLUCOSE, CAPILLARY
Glucose-Capillary: 97 mg/dL (ref 70–99)
Glucose-Capillary: 93 mg/dL (ref 70–99)
Glucose-Capillary: 105 mg/dL — ABNORMAL HIGH (ref 70–99)
Glucose-Capillary: 122 mg/dL — ABNORMAL HIGH (ref 70–99)
Glucose-Capillary: 167 mg/dL — ABNORMAL HIGH (ref 70–99)

## 2021-05-01 LAB — URINALYSIS, ROUTINE W REFLEX MICROSCOPIC
Bilirubin Urine: NEGATIVE
Glucose, UA: NEGATIVE mg/dL
Hgb urine dipstick: NEGATIVE
Ketones, ur: NEGATIVE mg/dL
Leukocytes,Ua: NEGATIVE
Nitrite: NEGATIVE
Protein, ur: NEGATIVE mg/dL
Specific Gravity, Urine: 1.025 (ref 1.005–1.030)
pH: 5 (ref 5.0–8.0)

## 2021-05-01 LAB — OCCULT BLOOD X 1 CARD TO LAB, STOOL: Fecal Occult Bld: POSITIVE — AB

## 2021-05-01 MED ORDER — SODIUM CHLORIDE 0.9 % IV SOLN: 2.0000 g | Freq: Three times a day (TID) | INTRAVENOUS | Status: DC

## 2021-05-01 MED ORDER — SODIUM CHLORIDE 0.9 % IV SOLN
2.0000 g | Freq: Two times a day (BID) | INTRAVENOUS | Status: DC
  Administered 2021-05-01 – 2021-05-03 (×4): 2 g via INTRAVENOUS
  Filled 2021-05-01 (×4): qty 2

## 2021-05-01 MED ORDER — LACTATED RINGERS IV BOLUS
500.0000 mL | Freq: Once | INTRAVENOUS | Status: AC
  Administered 2021-05-01: 500 mL via INTRAVENOUS

## 2021-05-01 MED ORDER — NOREPINEPHRINE 4 MG/250ML-% IV SOLN
INTRAVENOUS | Status: AC
  Administered 2021-05-01: 4 ug/min via INTRAVENOUS
  Filled 2021-05-01: qty 250

## 2021-05-01 MED ORDER — NOREPINEPHRINE 4 MG/250ML-% IV SOLN
0.0000 ug/min | INTRAVENOUS | Status: DC
  Administered 2021-05-02: 10 ug/min via INTRAVENOUS
  Administered 2021-05-02: 7 ug/min via INTRAVENOUS
  Administered 2021-05-03: 9 ug/min via INTRAVENOUS
  Administered 2021-05-03: 8 ug/min via INTRAVENOUS
  Administered 2021-05-04: 10 ug/min via INTRAVENOUS
  Administered 2021-05-04 (×2): 20 ug/min via INTRAVENOUS
  Administered 2021-05-04: 16 ug/min via INTRAVENOUS
  Administered 2021-05-04: 9 ug/min via INTRAVENOUS
  Administered 2021-05-05 (×3): 20 ug/min via INTRAVENOUS
  Administered 2021-05-05: 18 ug/min via INTRAVENOUS
  Filled 2021-05-01 (×15): qty 250

## 2021-05-01 MED ORDER — VANCOMYCIN HCL 1000 MG/200ML IV SOLN
1000.0000 mg | INTRAVENOUS | Status: DC
  Administered 2021-05-02: 1000 mg via INTRAVENOUS
  Filled 2021-05-01: qty 200

## 2021-05-01 MED ORDER — FREE WATER
200.0000 mL | Status: DC
  Administered 2021-05-01 – 2021-05-05 (×25): 200 mL

## 2021-05-01 MED ORDER — VANCOMYCIN HCL 1750 MG/350ML IV SOLN
1750.0000 mg | Freq: Once | INTRAVENOUS | Status: AC
  Administered 2021-05-01: 1750 mg via INTRAVENOUS
  Filled 2021-05-01: qty 350

## 2021-05-01 MED ORDER — NOREPINEPHRINE 4 MG/250ML-% IV SOLN: 0.0000 ug/min | INTRAVENOUS | Status: DC

## 2021-05-01 MED ORDER — POLYETHYLENE GLYCOL 3350 17 G PO PACK: 17.0000 g | PACK | Freq: Every day | ORAL | Status: DC

## 2021-05-01 MED ORDER — SODIUM CHLORIDE 0.9 % IV SOLN
250.0000 mL | INTRAVENOUS | Status: DC
  Administered 2021-05-03 (×2): 250 mL via INTRAVENOUS

## 2021-05-01 MED ORDER — SODIUM CHLORIDE 0.9 % IV BOLUS
1000.0000 mL | Freq: Once | INTRAVENOUS | Status: AC
  Administered 2021-05-01: 1000 mL via INTRAVENOUS

## 2021-05-01 NOTE — Plan of Care (Signed)
Patient evaluated at bedside for worsening shock. Has had increased WBC, fever over the last couple days.  Now on levophed 10.  Given bolus of LR, because appears patient is likely volume down.  She has cool extremities now as well.  Will evaluate for cardiac causes of shock as well with EKG and echo.   Will also get CXR.   C diff is also possible cause for septic shock and will get PCR.  Though patient has been on lactulose, she has a significantly rising leukocytosis and cultures thus far are negative with no obvious source so far.     Finally, neurological exam showed briskly reactive pupils, similar to prior.  Still does not withdraw to pain in all 4 extremities.  Appears at baseline.

## 2021-05-01 NOTE — Progress Notes (Signed)
Called to patient's bedside for hypotension. Fevers throughout the day, blood cultures sent. Now with hypotension and tachycardia. Presumed septic shock. No worsening ventilator settings or secretions. Will send UA as well. Abdominal exam benign. Sutures on her head look good. Will broaden abx to cefepime and vancomycin. Start norepinephrine. Given 500 cc bolus LR.

## 2021-05-01 NOTE — Progress Notes (Signed)
Subjective: Patient reports intubated, not following commands. No acute events overnight  Objective: Vital signs in last 24 hours: Temp:  [97.7 F (36.5 C)-103 F (39.4 C)] 99.6 F (37.6 C) (05/28 0450) Pulse Rate:  [91-118] 108 (05/28 0700) Resp:  [6-30] 16 (05/28 0700) BP: (84-132)/(53-86) 110/80 (05/28 0700) SpO2:  [90 %-96 %] 95 % (05/28 0700) FiO2 (%):  [40 %] 40 % (05/28 0721)  Intake/Output from previous day: 05/27 0701 - 05/28 0700 In: 723.4 [NG/GT:391.7; IV Piggyback:331.7] Out: 2130 [Urine:1150; Stool:980] Intake/Output this shift: No intake/output data recorded.  Physical Exam: Patient is intubated, opens eyes to noxious stimuli, not following commands  Lab Results: Recent Labs    04/30/21 0140 05/01/21 0208  WBC 31.6* 39.9*  HGB 14.6 15.8*  HCT 48.0* 51.1*  PLT 391 352   BMET Recent Labs    04/30/21 0140 05/01/21 0208  NA 146* 150*  K 4.2 5.1  CL 109 112*  CO2 29 24  GLUCOSE 173* 118*  BUN 55* 70*  CREATININE 0.75 1.00  CALCIUM 8.0* 8.4*    Studies/Results: DG CHEST PORT 1 VIEW  Result Date: 04/29/2021 CLINICAL DATA:  Fever EXAM: PORTABLE CHEST 1 VIEW COMPARISON:  04/26/2021 FINDINGS: Endotracheal and enteric tubes are present. Minimal patchy density is again identified at the left lung base. No pleural effusion. Normal heart size. IMPRESSION: Persistent minimal left basilar atelectasis/consolidation. Electronically Signed   By: Macy Mis M.D.   On: 04/29/2021 08:17   DG Abd Portable 1V  Result Date: 04/30/2021 CLINICAL DATA:  Emesis. EXAM: PORTABLE ABDOMEN - 1 VIEW COMPARISON:  04/28/2021 FINDINGS: The tip of the feeding tube is in the right upper quadrant in the expected location of the pylorus. Increased colonic gas compared with the previous exam. No small bowel dilatation. IMPRESSION: Tip of the enteric tube is in the expected location of the pylorus. Increased colonic gas compared with previous exam which may reflect colonic ileus.  Electronically Signed   By: Kerby Moors M.D.   On: 04/30/2021 13:50   US Abdomen Limited RUQ (LIVER/GB)  Result Date: 04/30/2021 CLINICAL DATA:  Transaminitis EXAM: ULTRASOUND ABDOMEN LIMITED RIGHT UPPER QUADRANT COMPARISON:  None. FINDINGS: Gallbladder: No gallstones or wall thickening visualized. No sonographic Murphy sign noted by sonographer. Common bile duct: Diameter: 3.5 mm Liver: No focal lesion identified on this limited exam. Within normal limits in parenchymal echogenicity. Portal vein is patent on color Doppler imaging with normal direction of blood flow towards the liver. Other: None. IMPRESSION: Unremarkable right upper quadrant ultrasound. Electronically Signed   By: Maurine Simmering   On: 04/30/2021 16:27    Assessment/Plan: Patient is stable. Ne new neurosurgical recommendations.    LOS: 11 days     Marvis Moeller, DNP, NP-C 05/01/2021, 7:56 AM

## 2021-05-01 NOTE — Progress Notes (Signed)
[facility] Progress Note Patient Name: [name] DOB: 05-18-1956 MRN: [id number]   Date of Service  05/01/2021  HPI/Events of Note  Patient had a small ball of gelatinous blood  Tinged stool earlier  Tonight. Patient is already on bid PPI .  eICU Interventions  CBC is scheduled for this a.m. Stool occult blood test ordered.        Kerry Kass Anntonette Madewell 05/01/2021, 12:15 AM

## 2021-05-01 NOTE — Progress Notes (Signed)
Pharmacy Antibiotic Note   Tammy Hernandez is a 65 y.o. female admitted on 04/21/2021 with sepsis.  Pharmacy has been consulted for cefepime and vancomycin dosing. Peripheral vasopressors initiated. Concern for developing sepsis given onset of hemodynamic instability re-starting cefepime and initiating vancomycin.  Estimated Creatinine Clearance: 52.2 mL/min (by C-G formula based on SCr of 1 mg/dL). - trending up  Plan: Cefepime 2g q12h Vancomycin 1750mg  LD now Vancomycin 1000mg  q24h (AUC 463 per calculator) - may require dose adjustment in AM given 0.25 bump in SCr from 5/27-5/28 and lower MAPs this afternoon  Height: 5\' 3"  (160 cm) Weight: 70.7 kg (155 lb 13.8 oz) IBW/kg (Calculated) : 52.4  Temp (24hrs), Avg:101.5 F (38.6 C), Min:99.6 F (37.6 C), Max:103.2 F (39.6 C)  Recent Labs  Lab 04/27/21 0104 04/28/21 0151 04/29/21 0309 04/29/21 0439 04/30/21 0140 05/01/21 0208  WBC  --   --  22.9*  --  31.6* 39.9*  CREATININE 0.65 0.61  --  0.61 0.75 1.00    Estimated Creatinine Clearance: 52.2 mL/min (by C-G formula based on SCr of 1 mg/dL).    No Known Allergies  Antimicrobials this admission: Cefepime 5/26 >> 5/27 Unasyn 5/27 >> 5/28 Cefepime 5/28 >> Vancomycin 5/28 >>  Microbiology results: 5/26 MRSA PCR - negative 5/26 TA - GNR / GPC on Gram stain  Thank you for allowing pharmacy to be a part of this patient's care.  Lorelei Pont, PharmD, BCPS Emergency Medicine Clinical Pharmacist 05/01/2021 7:03 PM

## 2021-05-01 NOTE — Progress Notes (Signed)
VASCULAR LAB    Bilateral lower extremity venous duplex has been performed.  See CV proc for preliminary results.   Arath Kaigler, RVT 05/01/2021, 12:29 PM

## 2021-05-01 NOTE — Progress Notes (Addendum)
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   Patient tolerated procedure well and was extubated in the OR; unfortunately, remained too somnolent to protect her airway and was subsequently reintubated. PCCM consulted for vent management.  Significant Hospital Events: Including procedures, antibiotic start and stop dates in addition to other pertinent events   . 5/17 - POD#0 R craniotomy for petroclival meningioma resection. Extubated in OR, too somnolent to protect her airway, reintubated. Admit to Neuro ICU post-op. . 04/21/2021 interventricular drain to be placed by neurosurgery . 04/21/2021 noted MRI . 04/21/2021 arterial line discontinued . 5/21 - Intermittently following commands . 5/22 - C/f EVD clogging . 5/23 - More awake this AM, following commands in LE. Decreased EVD output. Repeat CT Head to assess ventricles. . 5/24 - CT Head with improved appearance of ventricles per NSGY. Decreased FiO2 need, stable at 40%. Still with waxing/waning mental status. . 5/25 Able to tolerate PSV, still somnolent, EVD removed . 5/26 More somnolent today, CTH without significant changes, concern for developing infection, Cefepime initiated . 5/27 continued poor neuro status, increased WBC, respiratory culture with gm neg rods and few gm positives, but MRSA neg, narrow to unasyn  Interim History / Subjective:  Febrile overnight, tachycardic, still not responsive Blood stool overnight  Objective:  Blood pressure 106/78, pulse (!) 109,  temperature (!) 101.6 F (38.7 C), temperature source Axillary, resp. rate 17, height 5\' 3"  (1.6 m), weight 70.7 kg, SpO2 95 %.    Vent Mode: PSV FiO2 (%):  [40 %] 40 % Set Rate:  [16 bmp] 16 bmp Vt Set:  [420 mL] 420 mL PEEP:  [5 cmH20] 5 cmH20 Pressure Support:  [12 cmH20] 12 cmH20 Plateau Pressure:  [15 cmH20-16 cmH20] 16 cmH20   Intake/Output Summary (Last 24 hours) at 05/01/2021 0920 Last data filed at 05/01/2021 0645 Gross per 24 hour  Intake 623.4 ml  Output 1730 ml  Net -1106.6 ml   Filed Weights   04/28/21 0500 04/29/21 0500 04/30/21 0358  Weight: 71 kg 71 kg 70.7 kg     General:  Critically ill intubated, not responsive HEENT: mmm, ett to vent. Right craniotomy incision CDI Neuro: off sedation, not responsive, no purposeful movements CV: tachycardic, regular PULM: bilateral bronchial breath sounds, no wheezes or crackles, on PS trial GI: soft, bsx4 active  Extremities: warm/dry, no edema  Skin: no rashes or lesions   Labs/imaging that I have personally reviewed: (right click and "Reselect all SmartList Selections" daily)   Na 150 K 5.1, AST ALT elevated, Alb 2.3  RUQ u/s reviewed - normal WBC 39.9   Resolved Hospital Problem List:   Hyponatremia   Assessment & Plan:   Acute respiratory failure with hypoxia and hypercapnia requiring mechanical ventilation  Metabolic encephalopathy due to inadequate respiratory drive from postoperative brainstem swelling Intubated and re-intubated 5/17  P: -remains somnolent without sedation, will likely require trach/peg, has been discussed with husband and with patient's daughter and they are amenable --Maintain full vent support with SAT/SBT as tolerated -titrate Vent setting to maintain SpO2 greater than or equal to 90%. -HOB  elevated 30 degrees. -Plateau pressures less than 30 cm H20.  -Follow chest x-ray, ABG prn.   -Bronchial hygiene and RT/bronchodilator protocol. - talked with husband at bedside today - he  is ok with trach and PEG. Told him that will likely happen this week after Monday.   Meningioma s/p R craniotomy with retrosigmoid resection of petroclival meningioma Obstructive hydrocephalus s/p EVD Repeat head CT stable today and EVD removed 5/25 by NSGY Completed course of dexamethasone 5/22 P: -management per neurosurgery -continue neuro checks and close monitoring    Fever, Leukocytosis On unasyn with possible aspiration - CXR clear P: - RUQ u/s negative for cholestasis - will obtain LE dopplers to eval for clot - if infectious work up is negative, worry for   Elevated Transaminases Unclear etiology Ammonia 75  P: - would stop lactulose as hyperammonemia in the absence of chronic liver disease not strongly related to encephalopathy. She is also having bloody stools, so I worry this is harsh for her in the setting of colonic ileus.  - schedule miralax, trickle back tube feeds.   Hypernatremia worsening P: - increase free H20 flushes - may need to stop unasyn as this may be worsening na   Hypertension -Continue Labetalol for goal SBP <160    Diabetes mellitus type 2 with hyperglycemia - Continue standing insulin, SSI   Hypothyroidism -continue Synthroid  Best Practice: (right click and "Reselect all SmartList Selections" daily)   Per Primary    Critical care time: 45 minutes   CRITICAL CARE Performed by: Spero Geralds   Total critical care time: 40 minutes  Critical care time was exclusive of separately billable procedures and treating other patients.  Critical care was necessary to treat or prevent imminent or life-threatening deterioration. Critically ill for encephalopathy, respiratory failure.  Critical care was time spent personally by me on the following activities: development of treatment plan with patient and/or surrogate as well as nursing, discussions with consultants, evaluation of patient's response to treatment, examination of patient,  obtaining history from patient or surrogate, ordering and performing treatments and interventions, ordering and review of laboratory studies, ordering and review of radiographic studies, pulse oximetry and re-evaluation of patient's condition.   Spero Geralds [facility] 05/01/2021 9:26 AM  Pager: see AMION  If no response to pager , please call critical care on call (see AMION) until 7pm After 7:00 pm call Elink

## 2021-05-01 NOTE — Progress Notes (Signed)
[facility] Progress Note Patient Name: Tammy Hernandez DOB: 28-Oct-1956 MRN: [id number]   Date of Service  05/01/2021  HPI/Events of Note  Patient is cold and clammy with diminished peripheral pulses, she reportedly had copious diarrhea earlier today and BUN/Creat ratio of 70: 1 suggests hypovolemic shock , bedside RN reporting sluggish pupils which is a change from baseline. Patient is on Levo at 19 mcg.  eICU Interventions  NS 1000 ml iv fluid bolus ordered. PCCM ground crew asked to see patient at bedside for neuro assessment, and possible central line placement.        Tammy Hernandez 05/01/2021, 11:03 PM

## 2021-05-01 NOTE — Progress Notes (Signed)
Pt bp 58/33 (41), HR 112, temp 102.2 ax. Dr. Shearon Stalls on the floor and came to see pt. Numerous orders entered. LR bolus and levo started.

## 2021-05-02 ENCOUNTER — Inpatient Hospital Stay (HOSPITAL_COMMUNITY): Payer: 59

## 2021-05-02 DIAGNOSIS — J9601 Acute respiratory failure with hypoxia: Secondary | ICD-10-CM | POA: Diagnosis not present

## 2021-05-02 DIAGNOSIS — D496 Neoplasm of unspecified behavior of brain: Secondary | ICD-10-CM | POA: Diagnosis not present

## 2021-05-02 DIAGNOSIS — R9431 Abnormal electrocardiogram [ECG] [EKG]: Secondary | ICD-10-CM

## 2021-05-02 DIAGNOSIS — A419 Sepsis, unspecified organism: Secondary | ICD-10-CM | POA: Diagnosis not present

## 2021-05-02 DIAGNOSIS — R6521 Severe sepsis with septic shock: Secondary | ICD-10-CM

## 2021-05-02 DIAGNOSIS — E870 Hyperosmolality and hypernatremia: Secondary | ICD-10-CM | POA: Diagnosis not present

## 2021-05-02 LAB — GLUCOSE, CAPILLARY
Glucose-Capillary: 119 mg/dL — ABNORMAL HIGH (ref 70–99)
Glucose-Capillary: 138 mg/dL — ABNORMAL HIGH (ref 70–99)
Glucose-Capillary: 141 mg/dL — ABNORMAL HIGH (ref 70–99)
Glucose-Capillary: 146 mg/dL — ABNORMAL HIGH (ref 70–99)
Glucose-Capillary: 163 mg/dL — ABNORMAL HIGH (ref 70–99)
Glucose-Capillary: 177 mg/dL — ABNORMAL HIGH (ref 70–99)
Glucose-Capillary: 208 mg/dL — ABNORMAL HIGH (ref 70–99)

## 2021-05-02 LAB — ECHOCARDIOGRAM COMPLETE
AR max vel: 2.41 cm2
AV Area VTI: 2.32 cm2
AV Area mean vel: 2.57 cm2
AV Mean grad: 2 mmHg
AV Peak grad: 3.9 mmHg
Ao pk vel: 0.99 m/s
Area-P 1/2: 2.99 cm2
Height: 63 in
S' Lateral: 2.8 cm
Weight: 2574.97 oz

## 2021-05-02 LAB — COMPREHENSIVE METABOLIC PANEL
ALT: 463 U/L — ABNORMAL HIGH (ref 0–44)
AST: 291 U/L — ABNORMAL HIGH (ref 15–41)
Albumin: 2.1 g/dL — ABNORMAL LOW (ref 3.5–5.0)
Alkaline Phosphatase: 96 U/L (ref 38–126)
Anion gap: 11 (ref 5–15)
BUN: 89 mg/dL — ABNORMAL HIGH (ref 8–23)
CO2: 23 mmol/L (ref 22–32)
Calcium: 7.3 mg/dL — ABNORMAL LOW (ref 8.9–10.3)
Chloride: 110 mmol/L (ref 98–111)
Creatinine, Ser: 1.15 mg/dL — ABNORMAL HIGH (ref 0.44–1.00)
GFR, Estimated: 53 mL/min — ABNORMAL LOW (ref >60)
Glucose, Bld: 226 mg/dL — ABNORMAL HIGH (ref 70–99)
Potassium: 3.7 mmol/L (ref 3.5–5.1)
Sodium: 144 mmol/L (ref 135–145)
Total Bilirubin: 0.9 mg/dL (ref 0.3–1.2)
Total Protein: 4.8 g/dL — ABNORMAL LOW (ref 6.5–8.1)

## 2021-05-02 LAB — C DIFFICILE (CDIFF) QUICK SCRN (NO PCR REFLEX)
C Diff antigen: NEGATIVE
C Diff interpretation: NOT DETECTED
C Diff toxin: NEGATIVE

## 2021-05-02 IMAGING — MR MR HEAD WO/W CM
12 of 14 series · 40 of 48 positions shown · IV contrast (Gadavist)
Comparison: CT head [DATE].

CLINICAL DATA: Brain mass or lesion status post craniotomy for
tumor section. Unexplained fevers.

EXAM:
MRI HEAD WITHOUT AND WITH CONTRAST
TECHNIQUE: Multiplanar, multiecho pulse sequences of the brain and surrounding
structures were obtained without and with intravenous contrast.
CONTRAST:  7mL GADAVIST GADOBUTROL 1 MMOL/ML IV SOLN

[Series 5: DWI · axial · 3.0mm · 0.88mm/px · z∈[-82,+70]mm · 8 of 104 slices shown (1 of 4)]
[im 1/104]
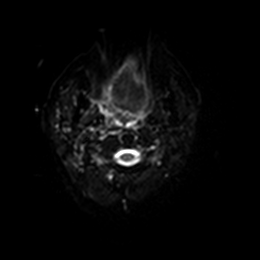
[im 15/104]
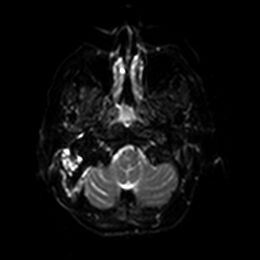
[im 30/104]
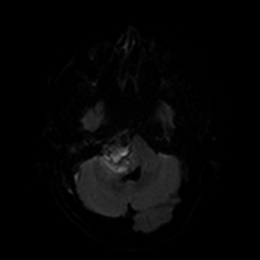
[im 45/104]
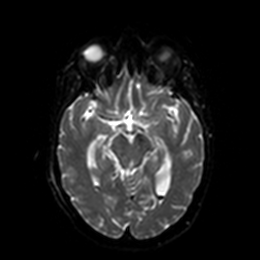
[im 59/104]
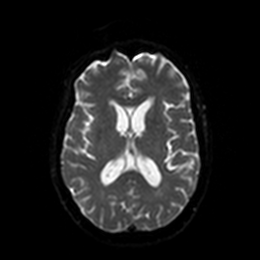
[im 74/104]
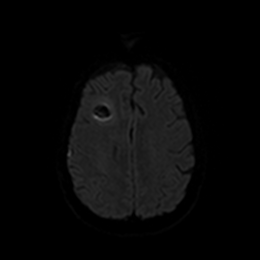
[im 89/104]
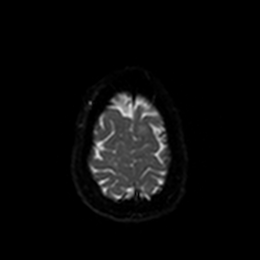
[im 104/104]
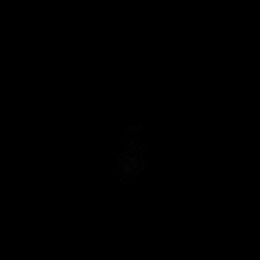

[Series 6: DWI · axial · 3.0mm · 0.88mm/px · z∈[-82,+70]mm · 4 of 52 slices shown (2 of 4)]
[im 1/52]
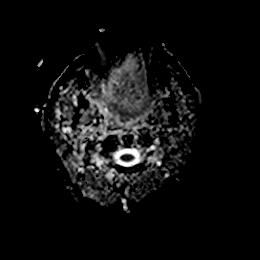
[im 18/52]
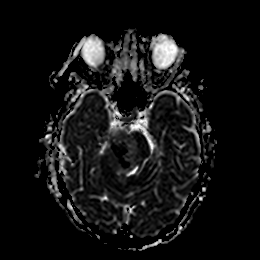
[im 35/52]
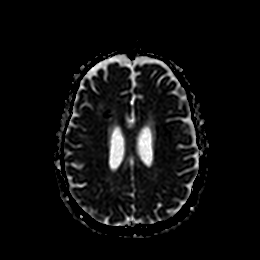
[im 52/52]
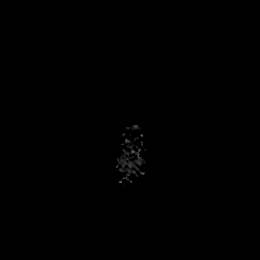

[Series 7: DWI · coronal · 4.0mm · 0.88mm/px · 5 of 72 slices shown (3 of 4)]
[im 1/72]
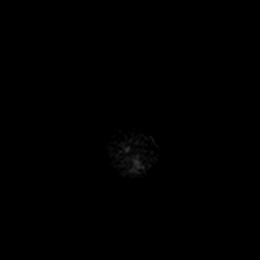
[im 18/72]
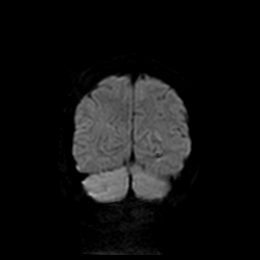
[im 36/72]
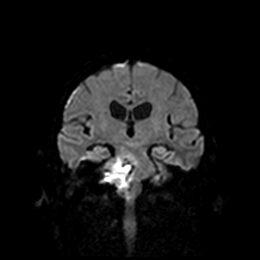
[im 54/72]
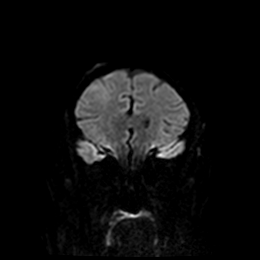
[im 72/72]
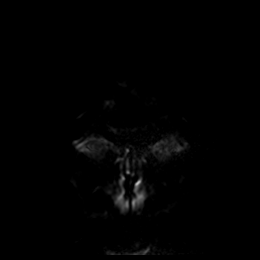

[Series 8: DWI · coronal · 4.0mm · 0.88mm/px · 3 of 36 slices shown (4 of 4)]
[im 1/36]
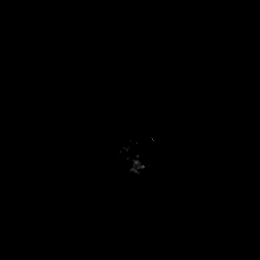
[im 18/36]
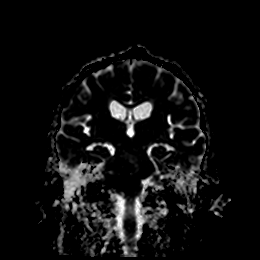
[im 36/36]
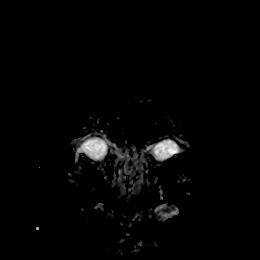

[Series 9: FLAIR · axial · 5.0mm · 0.45mm/px · z∈[-78,+65]mm · 2 of 25 slices shown]
[im 1/25]
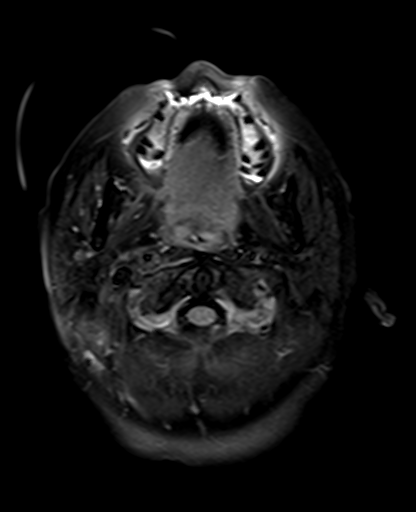
[im 25/25]
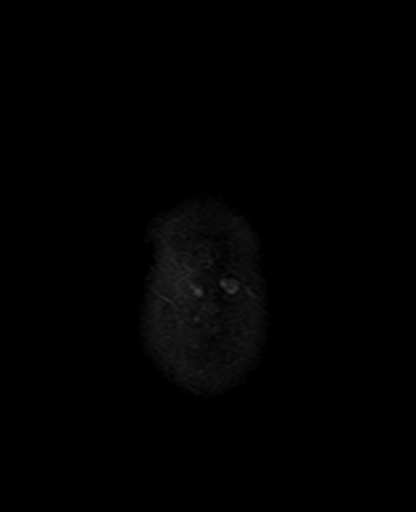

[Series 11: pha_images · axial · 3.0mm · 0.90mm/px · z∈[-88,+76]mm · 4 of 56 slices shown]
[im 1/56]
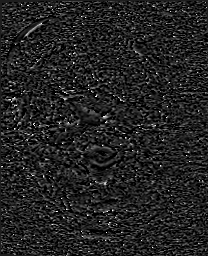
[im 19/56]
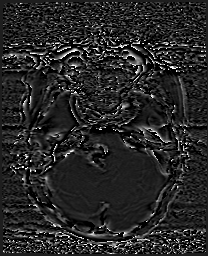
[im 37/56]
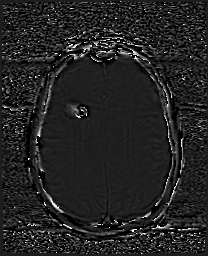
[im 56/56]
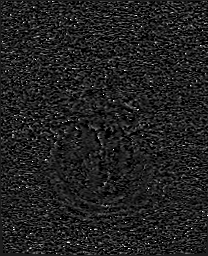

[Series 12: swi_images · axial · 3.0mm · 0.90mm/px · z∈[-88,+76]mm · 4 of 56 slices shown]
[im 1/56]
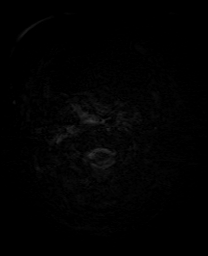
[im 19/56]
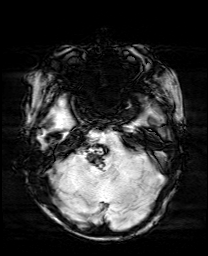
[im 37/56]
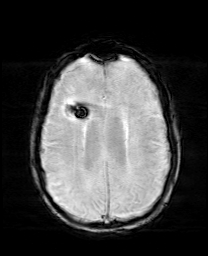
[im 56/56]
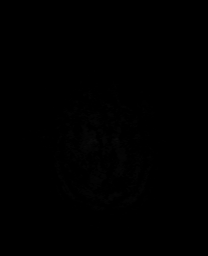

[Series 14: T2 · axial · 5.0mm · 0.72mm/px · z∈[-77,+66]mm · 2 of 25 slices shown]
[im 1/25]
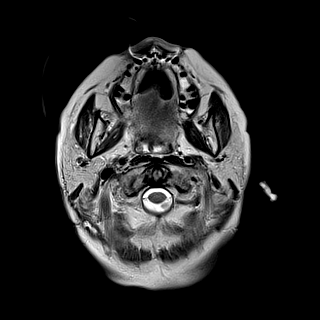
[im 25/25]
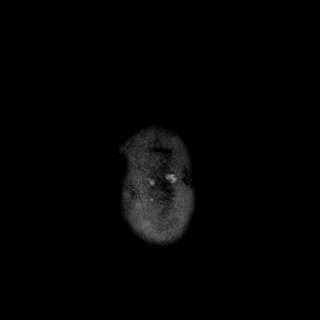

[Series 15: T1 · sagittal · 5.0mm · 0.75mm/px · 2 of 25 slices shown]
[im 1/25]
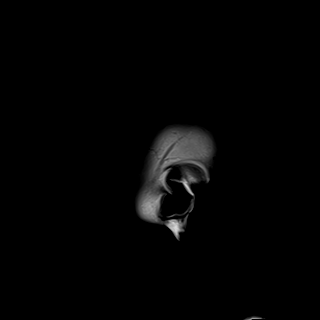
[im 25/25]
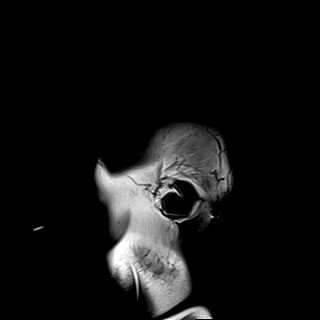

[Series 17: T2 post-contrast · coronal · 5.0mm · 0.72mm/px · 2 of 30 slices shown]
[im 1/30]
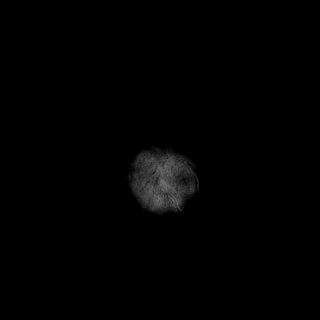
[im 30/30]
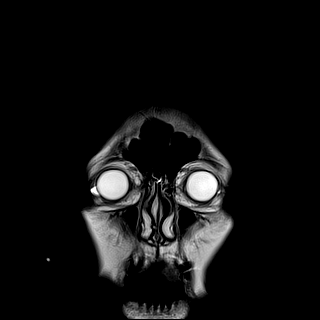

[Series 19: T1 post-contrast · coronal · 5.0mm · 0.34mm/px · 2 of 30 slices shown (1 of 2)]
[im 1/30]
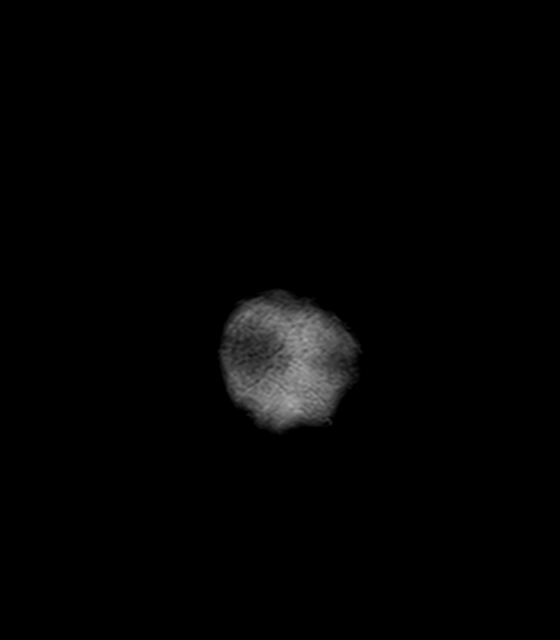
[im 30/30]
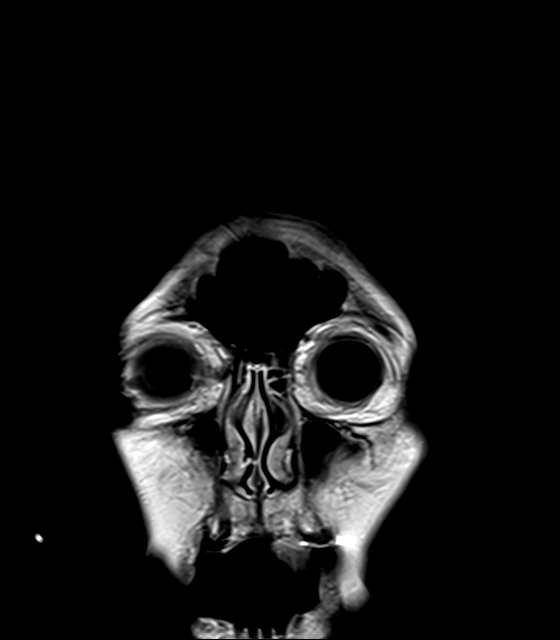

[Series 20: T1 post-contrast · sagittal · 5.0mm · 0.75mm/px · 2 of 25 slices shown (2 of 2)]
[im 1/25]
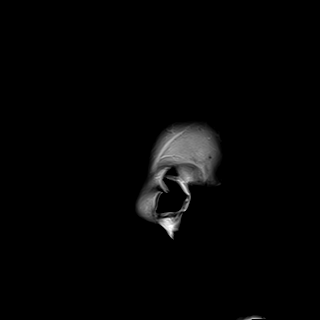
[im 25/25]
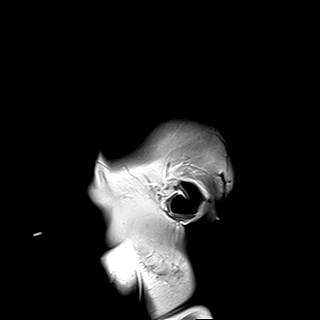

[40 of 48 positions shown; findings below may reference images not displayed]

FINDINGS: Brain: Postsurgical changes of partial resection of a right petrous
clival meningioma with similar size of residual tumor, measuring
approximately 2.3 by 1.5 cm on series 18, image 19). Hemorrhage and
edema in the right brainstem and cerebellar peduncle is likely
similar to prior, although evaluation is limited across modalities.
Similar hemorrhage along the right posterior fossa subjacent to the
right retromastoid craniotomy. Similar layering intraventricular
hemorrhage. Similar ventricular size. Similar edema/hemorrhage along
the prior right ventriculostomy tract. Small volume of subarachnoid
hemorrhage along the right frontal convexity, likely similar. No
evidence of acute infarct.

Vascular: Major arterial flow voids appear maintained at the skull
base.

Skull and upper cervical spine: Right retromastoid craniotomy.
Otherwise, normal marrow signal.

Sinuses/Orbits: Sinuses are largely clear.  Unremarkable orbits.

Other: Large right and moderate left mastoid effusions.
IMPRESSION: 1. Hemorrhage and edema in the right brainstem and cerebellar
although evaluation is limited across modalities. A repeat head CT
could allow for more direct comparison to the prior if there is
clinical concern for progression of hemorrhage.
2. Similar associated mass effect on the fourth ventricle and
intraventricular hemorrhage without progressive ventriculomegaly.
3. Hemorrhage/edema along the prior right ventriculostomy tract and
small volume of right frontal subarachnoid hemorrhage, likely
similar.
4. Right retro mastoid craniotomy with similar small volume
subjacent extra-axial hemorrhage. Similar size of the putative right
petrous clival meningioma, detailed above.
5. Large right and moderate left mastoid effusions.

## 2021-05-02 IMAGING — CT CT ABD-PELV W/ CM
2 of 5 series · 16 of 46 positions shown, 18 images · IV contrast (omnipaque)
Comparison: None.
COMPARISON: None.

Addendum:
CLINICAL DATA: Elevated white count with sepsis and diarrhea.

EXAM:
CT ABDOMEN AND PELVIS WITH CONTRAST
TECHNIQUE: Multidetector CT imaging of the abdomen and pelvis was performed
using the standard protocol following bolus administration of
intravenous contrast.
CONTRAST:  50mL OMNIPAQUE IOHEXOL 300 MG/ML  SOLN

[Series 3: a/p w/ 5mm · axial · 0.81mm/px · z∈[-436,-16]mm · 13 of 94 slices shown, 15 images]
[im 5/94  soft-tissue]
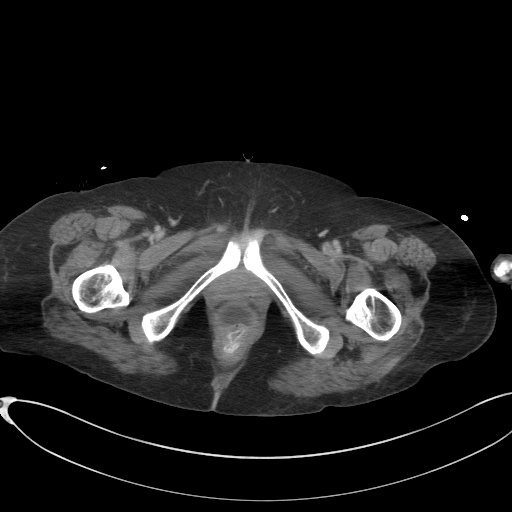
[im 5/94  bone]
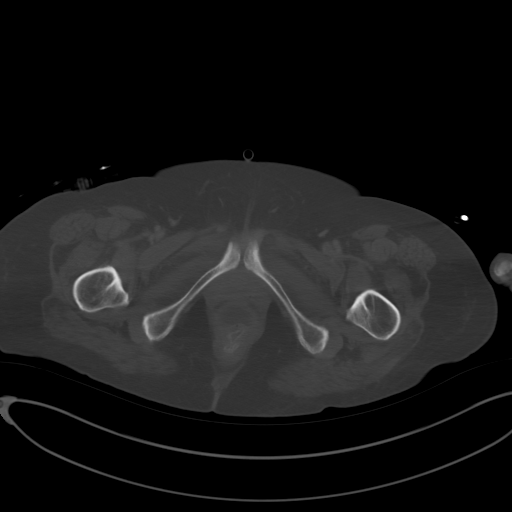
[im 14/94  soft-tissue]
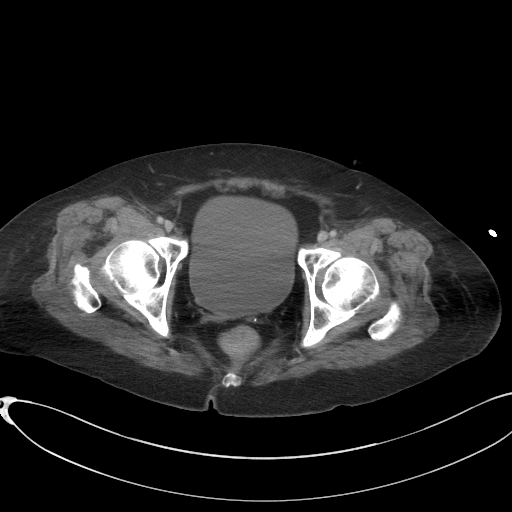
[im 19/94  soft-tissue]
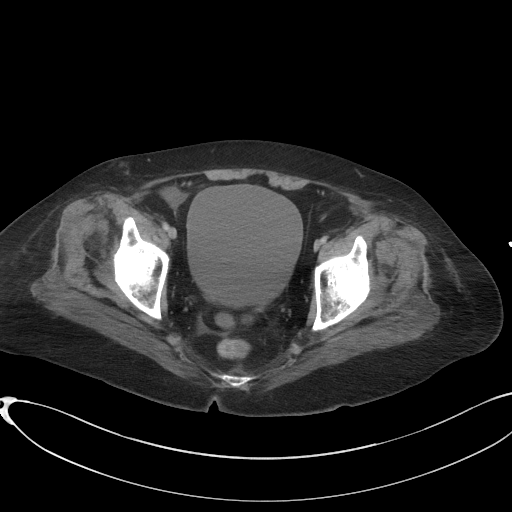
[im 28/94  soft-tissue]
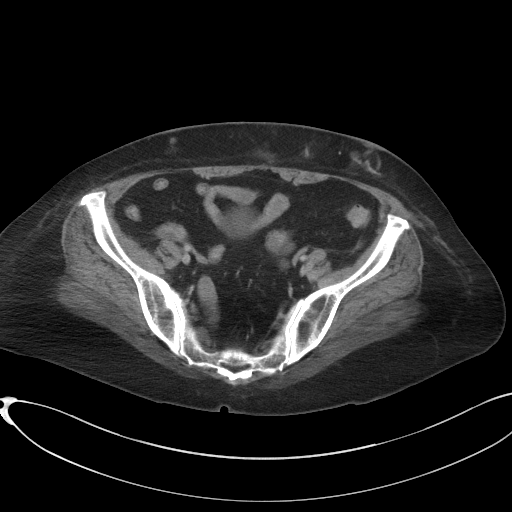
[im 33/94  soft-tissue]
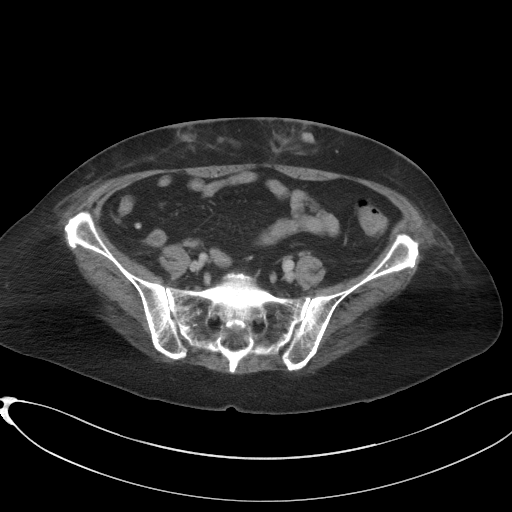
[im 42/94  soft-tissue]
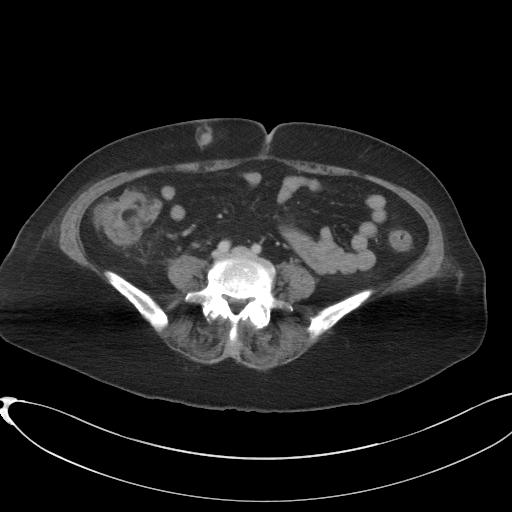
[im 47/94  soft-tissue]
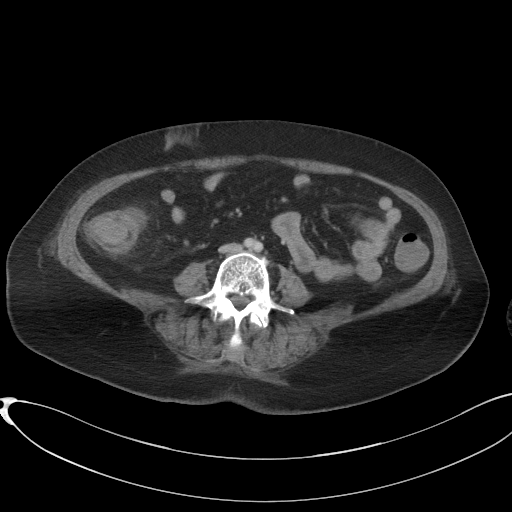
[im 52/94  soft-tissue]
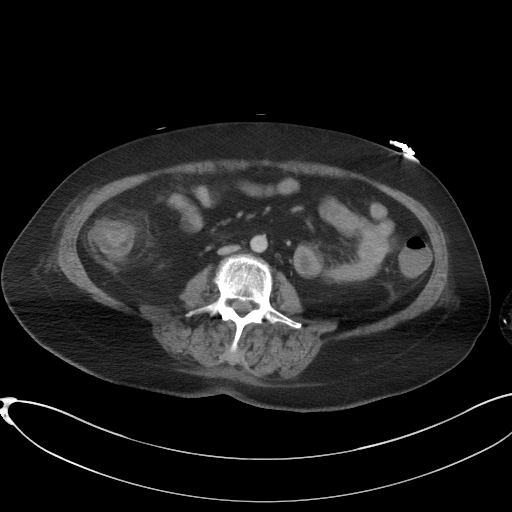
[im 61/94  soft-tissue]
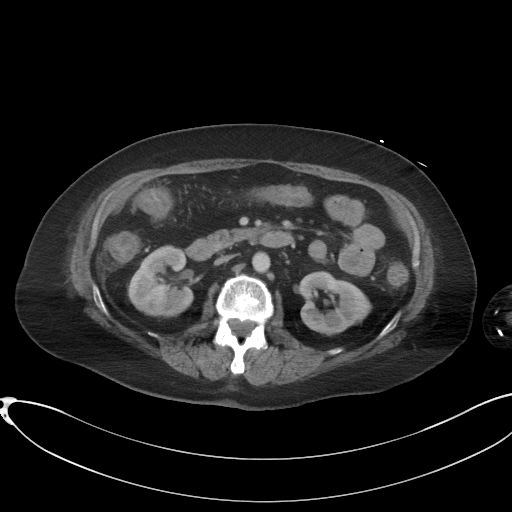
[im 61/94  bone]
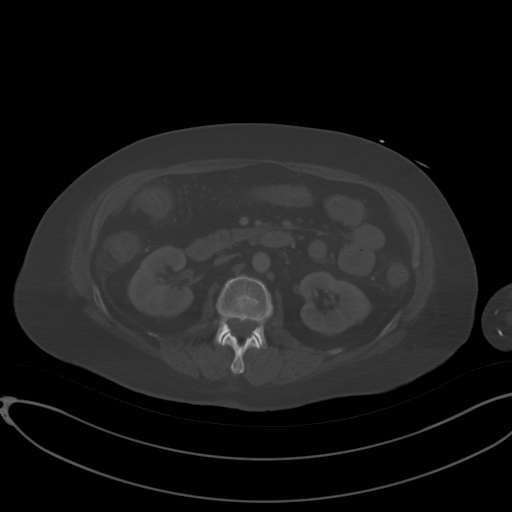
[im 66/94  soft-tissue]
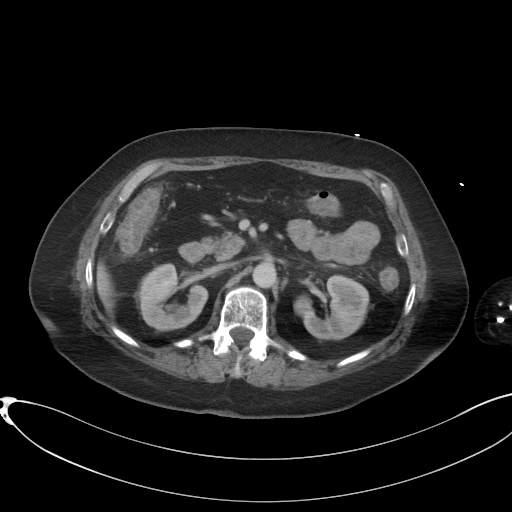
[im 75/94  soft-tissue]
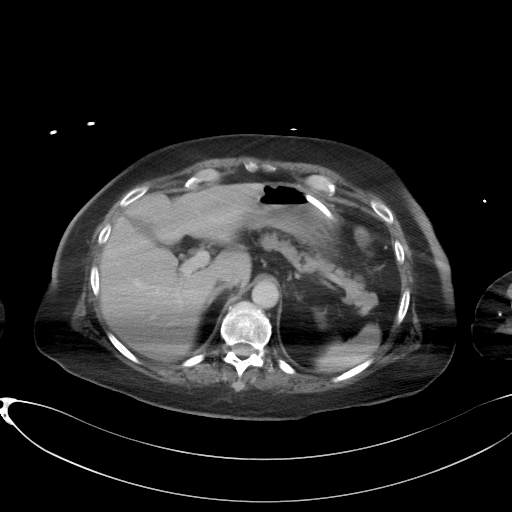
[im 80/94  soft-tissue]
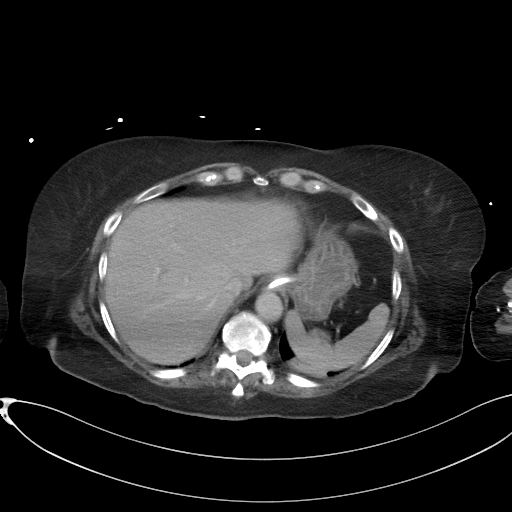
[im 89/94  soft-tissue]
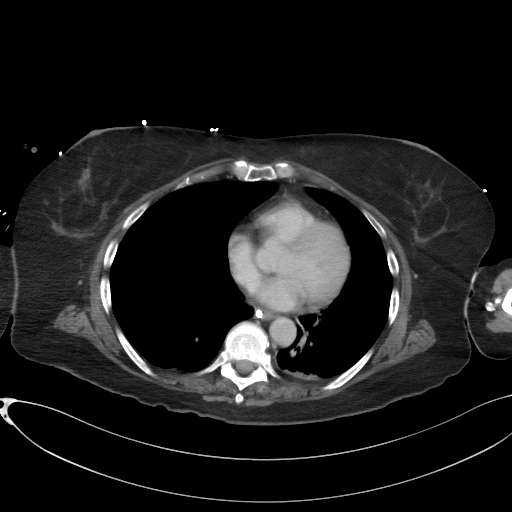

[Series 6: a/p w/ cor · coronal · 0.84mm/px · 3 of 151 slices shown]
[im 51/151  soft-tissue]
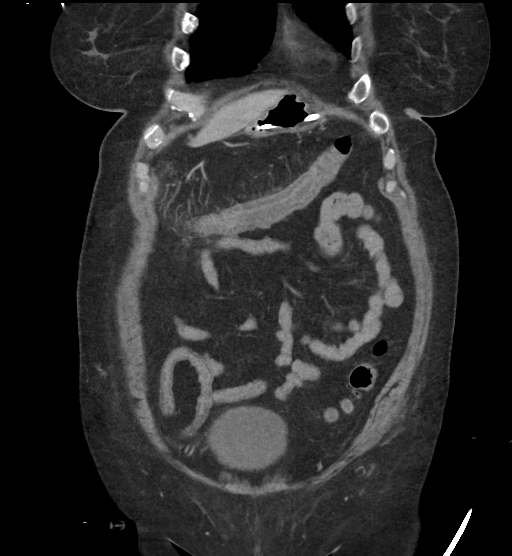
[im 67/151  soft-tissue]
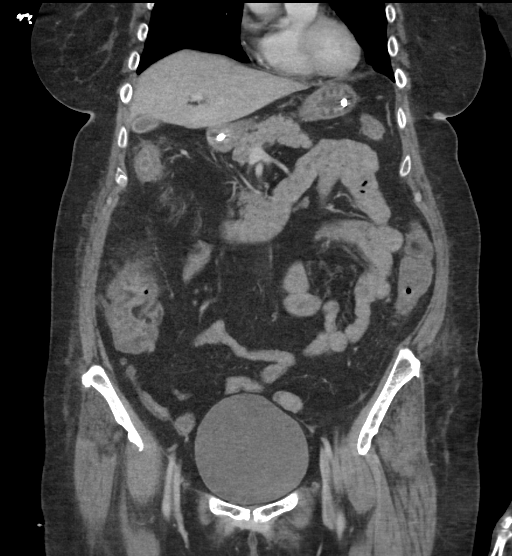
[im 84/151  soft-tissue]
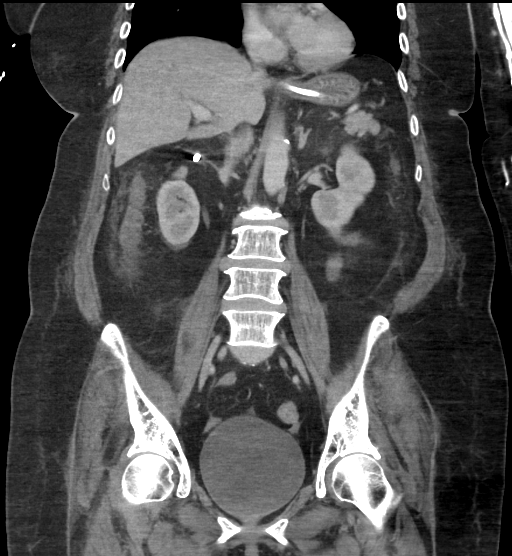

[16 of 46 positions shown; findings below may reference images not displayed]

FINDINGS: Lower chest:  Mild dependent atelectasis.

Hepatobiliary: No focal liver abnormality.No evidence of biliary
obstruction or stone.

Pancreas: Unremarkable.

Spleen: Unremarkable.

Adrenals/Urinary Tract: Negative adrenals. No hydronephrosis or
stone. Unremarkable bladder.

Stomach/Bowel: Submucosal edema in the colon, especially proximally
and at the transverse/proximal descending segments. No perforation.
No dilatation for toxic megacolon. No appendicitis. Normally seated
rectal tube.

Vascular/Lymphatic: Atheromatous calcification of the aorta and
iliacs. No mass or adenopathy.

Reproductive:Hysterectomy

Other: No ascites or pneumoperitoneum. Multiple medication injection
sites in the subcutaneous ventral abdomen.

Musculoskeletal: No acute abnormalities.
IMPRESSION: Colitis without complicating dilatation or abscess.

ADDENDUM:
Patient's care team requested addendum to the report 2 include
hepatic measurements as patient is in organ donation candidate. The
liver measures 10.5 cm in maximum craniocaudal dimension on image
83/6 and 17.5 cm in maximum axial dimension on image [DATE].

*** End of Addendum ***
FINDINGS: Lower chest:  Mild dependent atelectasis.

Hepatobiliary: No focal liver abnormality.No evidence of biliary
obstruction or stone.

Pancreas: Unremarkable.

Spleen: Unremarkable.

Adrenals/Urinary Tract: Negative adrenals. No hydronephrosis or
stone. Unremarkable bladder.

Stomach/Bowel: Submucosal edema in the colon, especially proximally
and at the transverse/proximal descending segments. No perforation.
No dilatation for toxic megacolon. No appendicitis. Normally seated
rectal tube.

Vascular/Lymphatic: Atheromatous calcification of the aorta and
iliacs. No mass or adenopathy.

Reproductive:Hysterectomy

Other: No ascites or pneumoperitoneum. Multiple medication injection
sites in the subcutaneous ventral abdomen.

Musculoskeletal: No acute abnormalities.
IMPRESSION: Colitis without complicating dilatation or abscess.

## 2021-05-02 MED ORDER — IOHEXOL 300 MG/ML  SOLN
50.0000 mL | Freq: Once | INTRAMUSCULAR | Status: AC | PRN
  Administered 2021-05-02: 50 mL via INTRAVENOUS

## 2021-05-02 MED ORDER — GADOBUTROL 1 MMOL/ML IV SOLN
7.0000 mL | Freq: Once | INTRAVENOUS | Status: AC | PRN
  Administered 2021-05-02: 7 mL via INTRAVENOUS

## 2021-05-02 MED ORDER — PERFLUTREN LIPID MICROSPHERE
1.0000 mL | INTRAVENOUS | Status: AC | PRN
  Administered 2021-05-02: 3 mL via INTRAVENOUS
  Filled 2021-05-02: qty 10

## 2021-05-02 NOTE — Progress Notes (Signed)
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   Patient tolerated procedure well and was extubated in the OR; unfortunately, remained too somnolent to protect her airway and was subsequently reintubated. PCCM consulted for vent management.  Significant Hospital Events: Including procedures, antibiotic start and stop dates in addition to other pertinent events   . 5/17 - POD#0 R craniotomy for petroclival meningioma resection. Extubated in OR, too somnolent to protect her airway, reintubated. Admit to Neuro ICU post-op. . 04/21/2021 interventricular drain to be placed by neurosurgery . 04/21/2021 noted MRI . 04/21/2021 arterial line discontinued . 5/21 - Intermittently following commands . 5/22 - C/f EVD clogging . 5/23 - More awake this AM, following commands in LE. Decreased EVD output. Repeat CT Head to assess ventricles. . 5/24 - CT Head with improved appearance of ventricles per NSGY. Decreased FiO2 need, stable at 40%. Still with waxing/waning mental status. . 5/25 Able to tolerate PSV, still somnolent, EVD removed . 5/26 More somnolent today, CTH without significant changes, concern for developing infection, Cefepime initiated . 5/27 continued poor neuro status, increased WBC, respiratory culture with gm neg rods and few gm positives, but MRSA neg, narrow to unasyn . 5/28 hypotension and fevers, concern for worsening septic shock.  Interim History / Subjective:  Still febrile and persistent shock this morning. Lots of diarrhea. C.  Diff negative.   Objective:  Blood pressure 111/65, pulse 88, temperature 98.3 F (36.8 C), temperature source Axillary, resp. rate 16, height 5\' 3"  (1.6 m), weight 73 kg, SpO2 100 %.    Vent Mode: PRVC FiO2 (%):  [40 %] 40 % Set Rate:  [16 bmp] 16 bmp Vt Set:  [420 mL] 420 mL PEEP:  [5 cmH20] 5 cmH20 Pressure Support:  [12 cmH20] 12 cmH20 Plateau Pressure:  [13 cmH20-16 cmH20] 16 cmH20   Intake/Output Summary (Last 24 hours) at 05/02/2021 1053 Last data filed at 05/02/2021 0800 Gross per 24 hour  Intake 3425.02 ml  Output 750 ml  Net 2675.02 ml   Filed Weights   04/29/21 0500 04/30/21 0358 05/02/21 0400  Weight: 71 kg 70.7 kg 73 kg     General:  Critically ill intubated, not responsive HEENT: ETT to vent, s/p right craniotomy, incisions CDI Neuro: off sedation, not responsive, no purposeful movements CV: RRR no mrg PULM: no wheezes or crackles, sounds of mechanical ventilation bilaterally GI: soft, bsx4 active  Extremities: warm/dry, no edema  Skin: no rashes or lesions   Labs/imaging that I have personally reviewed: (right click and "Reselect all SmartList Selections" daily)   Glucose 163 Na 144 K 3.7 Cr 1.15 C diff negative   Resolved Hospital Problem List:   Hyponatremia   Assessment & Plan:   Tammy Hernandez is a 65 y.o. woman who presented on 04/17/2021 for elective meningioma resection.   Meningioma s/p R craniotomy with retrosigmoid resection of petroclival meningioma Obstructive hydrocephalus s/p EVD Repeat head CT stable today and EVD removed 5/25 by NSGY Completed course of dexamethasone 5/22 P: -management per neurosurgery -continue neuro checks and close monitoring   Septic Shock Broadened to  cefepime and vancomycin P: - RUQ u/s negative for cholestasis, blood cultures pending, UA and CXR unremarkable - CT A/P sent this morning. If negative would go to MRI brain to eval for CNS etiology. Discussed with NSGY.  - le dopplers negative for  clot  Acute respiratory failure with hypoxia and hypercapnia requiring mechanical ventilation  Metabolic encephalopathy due to inadequate respiratory drive from postoperative brainstem swelling Intubated and re-intubated 5/17  P: -remains somnolent without sedation, will likely require trach/peg, has been discussed with husband and with patient's daughter and they are amenable --Maintain full vent support with SAT/SBT as tolerated -titrate Vent setting to maintain SpO2 greater than or equal to 90%. -HOB elevated 30 degrees. -Plateau pressures less than 30 cm H20.  -Follow chest x-ray, ABG prn.   -Bronchial hygiene and RT/bronchodilator protocol. Hypernatremia worsening P: - increase free H20 flushes - may need to stop unasyn as this may be worsening na   Diabetes mellitus type 2 with hyperglycemia - Continue standing insulin, SSI  Hypothyroidism -continue Synthroid  Best Practice: (right click and "Reselect all SmartList Selections" daily)   Per Primary    Critical care time: 39 minutes   The patient is critically ill due to respiratory failure, septic shock, .  Critical care was necessary to treat or prevent imminent or life-threatening deterioration.  Critical care was time spent personally by me on the following activities: development of treatment plan with patient and/or surrogate as well as nursing, discussions with consultants, evaluation of patient's response to treatment, examination of patient, obtaining history from patient or surrogate, ordering and performing treatments and interventions, ordering and review of laboratory studies, ordering and review of radiographic studies, pulse oximetry, re-evaluation of patient's condition and participation in multidisciplinary rounds.   Critical Care Time devoted to patient care services described in this note is 39 minutes. This time reflects time of care of this [facility] . This critical care time does not reflect  separately billable procedures or procedure time, teaching time or supervisory time of PA/NP/Med student/Med Resident etc but could involve care discussion time.       Spero Geralds [facility] 05/02/2021 10:53 AM  Pager: see AMION  If no response to pager , please call critical care on call (see AMION) until 7pm After 7:00 pm call Elink

## 2021-05-02 NOTE — Progress Notes (Signed)
Ventilator patient transported from 4N17 to MRI and back without any complications.

## 2021-05-02 NOTE — Progress Notes (Signed)
Ventilator patient transported from 4N17 to CT2 and back without any complications.

## 2021-05-02 NOTE — Progress Notes (Signed)
  Echocardiogram 2D Echocardiogram has been performed.  Tammy Hernandez 05/02/2021, 6:04 PM

## 2021-05-02 NOTE — Progress Notes (Addendum)
Neurosurgery Service Progress Note  Subjective: Unfortunately has progressed into likely septic shock, on pressors, unclear primary source  Objective: Vitals:   05/02/21 0416 05/02/21 0500 05/02/21 0600 05/02/21 0700  BP:  (!) 99/53 105/66 115/72  Pulse:      Resp:  16 16 16   Temp:      TempSrc:      SpO2: 100%     Weight:      Height:        Physical Exam: Intubated, eyes open spontaneously PERRL, FC reliably in bilateral feet and with eye opening/closure, R eye with good closure, scleral edema improving Incision c/d/i, no e/i   Assessment & Plan: 65 y.o. 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 CTH with pneumocephalus, blood products in the resection cavity, improvement in aqueductal stenosis but persistent supratentorial ventriculomegaly, 5/18 s/p R F EVD for persistent depressed mental status. 5/19 MRI w/ good resection, using rough measurements 86% of tumor volume, expected diffusion along the resection cavity, small dot at the right inf vs sup colliculus, no obvious perforator strokes, 5/23 rpt CTH with resolution of aqueductal stenosis, 5/25 CTH stable ventricles, passed clamp trial, EVD removed, 5/26 rpt CTH stable  -septic shock without primary, CTH ordered. Will get CT A/P given high WBC count and severe diarrhea, much more likely she has C Diff than intracranial infection -currently severe contrast shortage, will perform CT A/P w/o contrast -I think it is unlikely this is an intracranial infection given that her neurologic function is so good despite high fevers and sepsis with a normal appearing wound. But I'm obviously biased. If we're going to look for an infection, will d/c the [facility] and put in an MRI w/wo contrast, if the CT A/P is negative. Will also see on that image if she has any sinusitis that could be contributing. -CCM recs -SCDs/TEDs, SQH   Judith Part  05/02/21 8:40 AM

## 2021-05-03 ENCOUNTER — Inpatient Hospital Stay (HOSPITAL_COMMUNITY): Payer: 59

## 2021-05-03 DIAGNOSIS — A419 Sepsis, unspecified organism: Secondary | ICD-10-CM

## 2021-05-03 LAB — COMPREHENSIVE METABOLIC PANEL
ALT: 476 U/L — ABNORMAL HIGH (ref 0–44)
AST: 261 U/L — ABNORMAL HIGH (ref 15–41)
Albumin: 2 g/dL — ABNORMAL LOW (ref 3.5–5.0)
Alkaline Phosphatase: 112 U/L (ref 38–126)
Anion gap: 8 (ref 5–15)
BUN: 65 mg/dL — ABNORMAL HIGH (ref 8–23)
CO2: 22 mmol/L (ref 22–32)
Calcium: 7.6 mg/dL — ABNORMAL LOW (ref 8.9–10.3)
Chloride: 112 mmol/L — ABNORMAL HIGH (ref 98–111)
Creatinine, Ser: 0.74 mg/dL (ref 0.44–1.00)
GFR, Estimated: >60 mL/min (ref >60)
Glucose, Bld: 135 mg/dL — ABNORMAL HIGH (ref 70–99)
Potassium: 3.4 mmol/L — ABNORMAL LOW (ref 3.5–5.1)
Sodium: 142 mmol/L (ref 135–145)
Total Bilirubin: 0.8 mg/dL (ref 0.3–1.2)
Total Protein: 4.7 g/dL — ABNORMAL LOW (ref 6.5–8.1)

## 2021-05-03 LAB — BLOOD CULTURE ID PANEL (REFLEXED) - BCID2

## 2021-05-03 LAB — POCT I-STAT 7, (LYTES, BLD GAS, ICA,H+H)
Acid-base deficit: 1 mmol/L (ref 0.0–2.0)
Bicarbonate: 23.2 mmol/L (ref 20.0–28.0)
Calcium, Ion: 1.09 mmol/L — ABNORMAL LOW (ref 1.15–1.40)
HCT: 31 % — ABNORMAL LOW (ref 36.0–46.0)
Hemoglobin: 10.5 g/dL — ABNORMAL LOW (ref 12.0–15.0)
O2 Saturation: 99 %
Patient temperature: 98.3
Potassium: 3.1 mmol/L — ABNORMAL LOW (ref 3.5–5.1)
Sodium: 149 mmol/L — ABNORMAL HIGH (ref 135–145)
TCO2: 24 mmol/L (ref 22–32)
pCO2 arterial: 35.9 mmHg (ref 32.0–48.0)
pH, Arterial: 7.418 (ref 7.350–7.450)
pO2, Arterial: 134 mmHg — ABNORMAL HIGH (ref 83.0–108.0)

## 2021-05-03 LAB — GLUCOSE, CAPILLARY
Glucose-Capillary: 138 mg/dL — ABNORMAL HIGH (ref 70–99)
Glucose-Capillary: 156 mg/dL — ABNORMAL HIGH (ref 70–99)
Glucose-Capillary: 163 mg/dL — ABNORMAL HIGH (ref 70–99)
Glucose-Capillary: 144 mg/dL — ABNORMAL HIGH (ref 70–99)
Glucose-Capillary: 114 mg/dL — ABNORMAL HIGH (ref 70–99)
Glucose-Capillary: 103 mg/dL — ABNORMAL HIGH (ref 70–99)

## 2021-05-03 LAB — SODIUM
Sodium: 140 mmol/L (ref 135–145)
Sodium: 145 mmol/L (ref 135–145)

## 2021-05-03 LAB — PROTEIN AND GLUCOSE, CSF
Glucose, CSF: 98 mg/dL — ABNORMAL HIGH (ref 40–70)
Total  Protein, CSF: 51 mg/dL — ABNORMAL HIGH (ref 15–45)

## 2021-05-03 LAB — CSF CELL COUNT WITH DIFFERENTIAL
Eosinophils, CSF: 0 % (ref 0–1)
Lymphs, CSF: 4 % — ABNORMAL LOW (ref 40–80)
Monocyte-Macrophage-Spinal Fluid: 5 % — ABNORMAL LOW (ref 15–45)
RBC Count, CSF: 3650 /mm3 — ABNORMAL HIGH
Segmented Neutrophils-CSF: 91 % — ABNORMAL HIGH (ref 0–6)
Tube #: 1
WBC, CSF: 27 /mm3 (ref 0–5)

## 2021-05-03 IMAGING — CT CT HEAD W/O CM
4 series · 15 of 47 positions shown, 17 images · non-contrast
Comparison: MRI [DATE].

CLINICAL DATA: Mental status change, unknown cause.  Graph

EXAM:
CT HEAD WITHOUT CONTRAST
TECHNIQUE: Contiguous axial images were obtained from the base of the skull
through the vertex without intravenous contrast.

[Series 3: head bone · axial · 0.44mm/px · z∈[-476,-460]mm · 2 of 80 slices shown]
[im 8/80  bone]
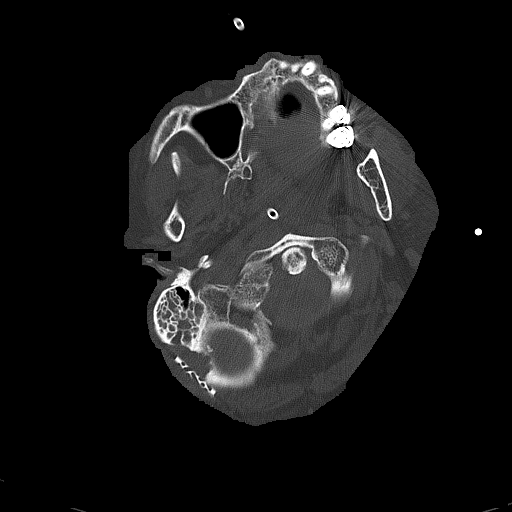
[im 16/80  bone]
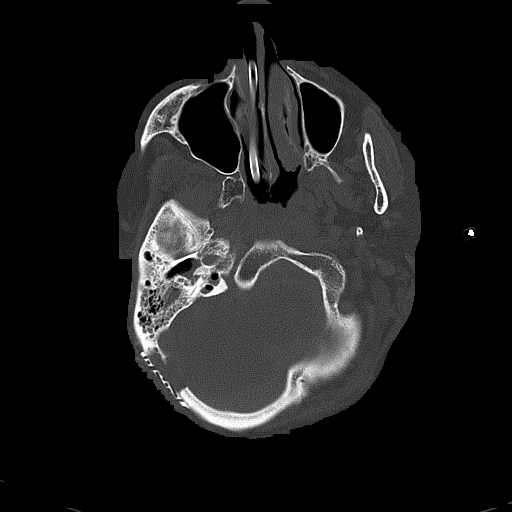

[Series 4: head without · axial · non-contrast · 0.44mm/px · z∈[-474,-354]mm · 7 of 32 slices shown, 9 images]
[im 4/32  brain]
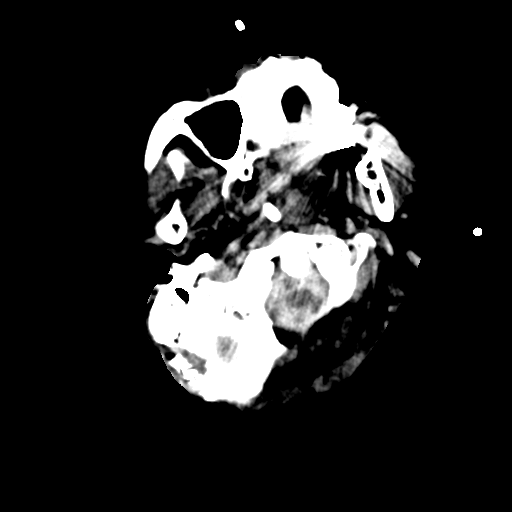
[im 4/32  bone]
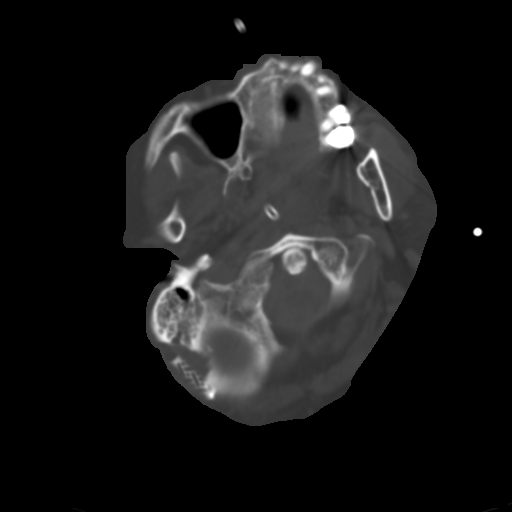
[im 8/32  brain]
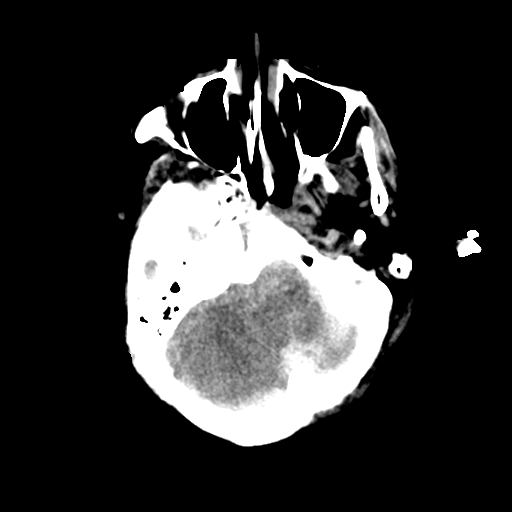
[im 12/32  brain]
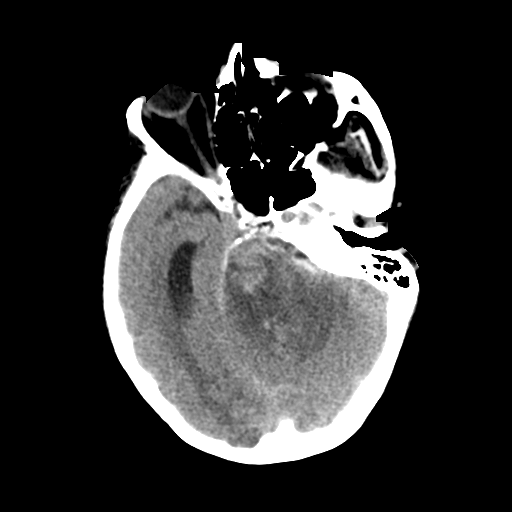
[im 16/32  brain]
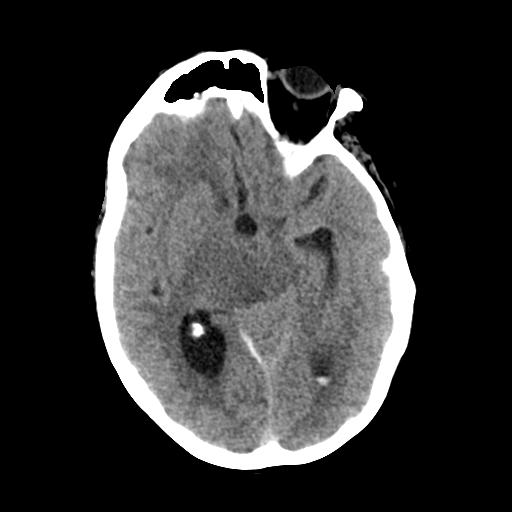
[im 20/32  brain]
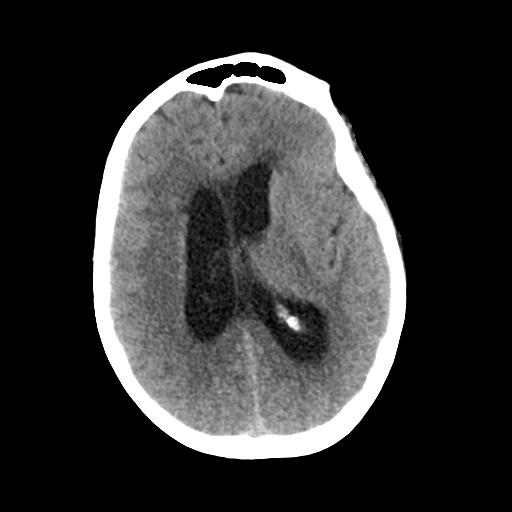
[im 20/32  bone]
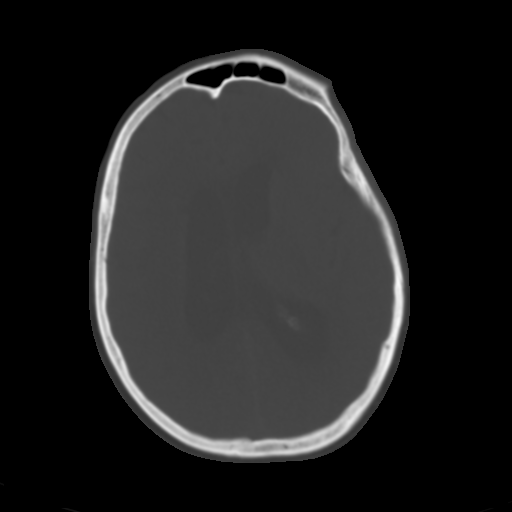
[im 24/32  brain]
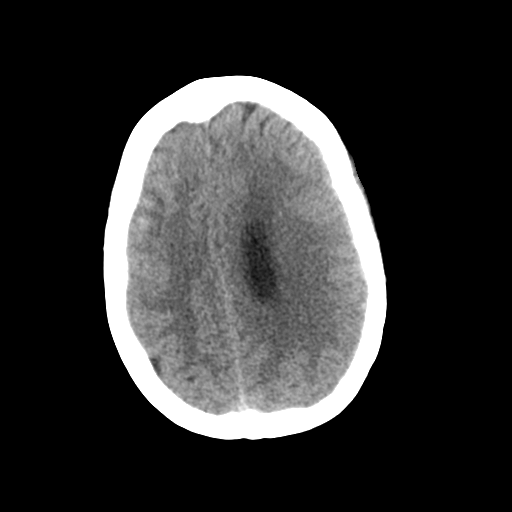
[im 28/32  brain]
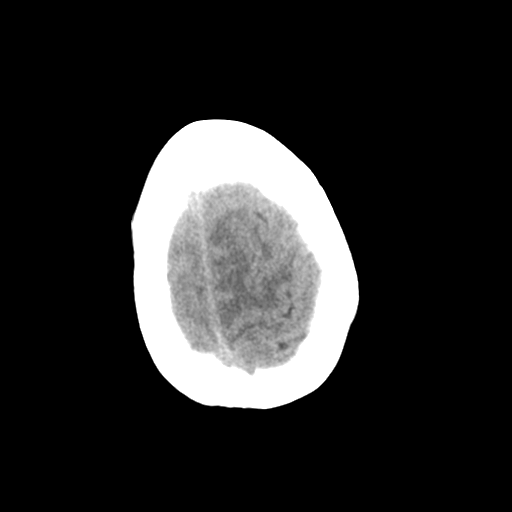

[Series 5: head without cor · coronal · non-contrast · 0.33mm/px · 3 of 68 slices shown]
[im 23/68  brain]
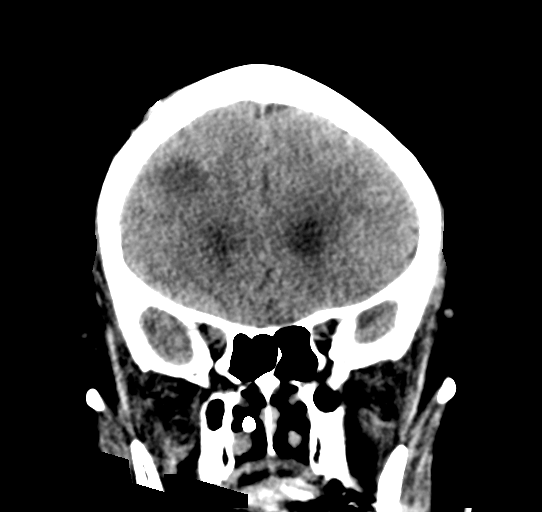
[im 30/68  brain]
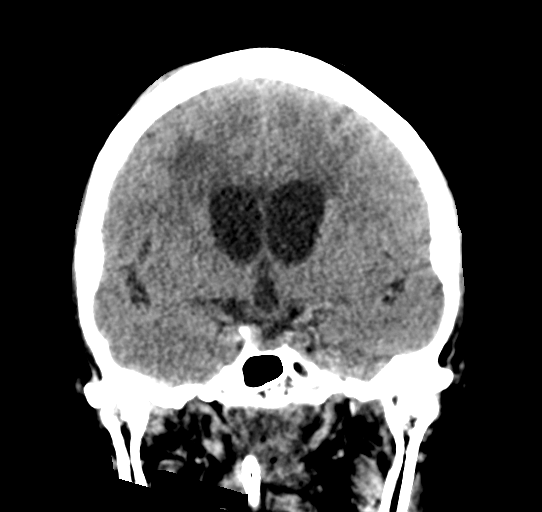
[im 38/68  brain]
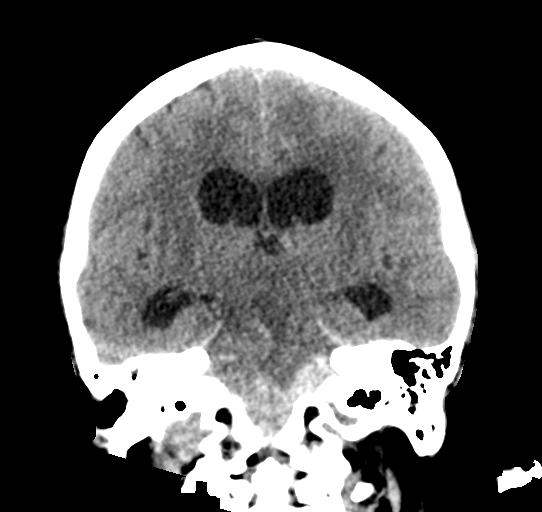

[Series 6: head without sag · sagittal · non-contrast · 0.34mm/px · 3 of 50 slices shown]
[im 22/50  brain]
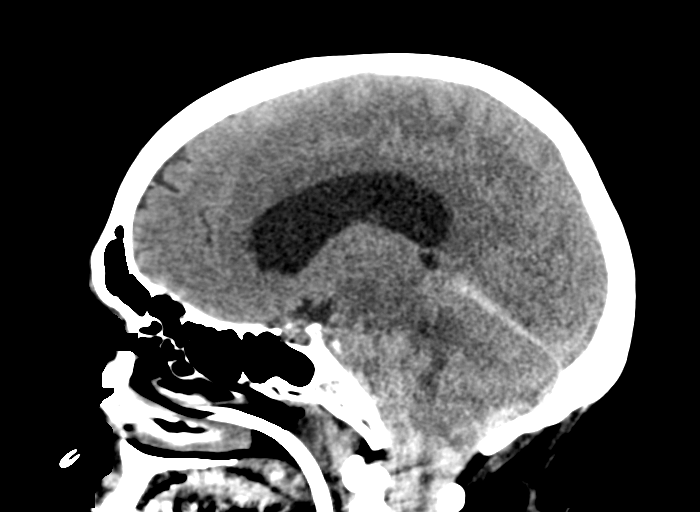
[im 25/50  brain]
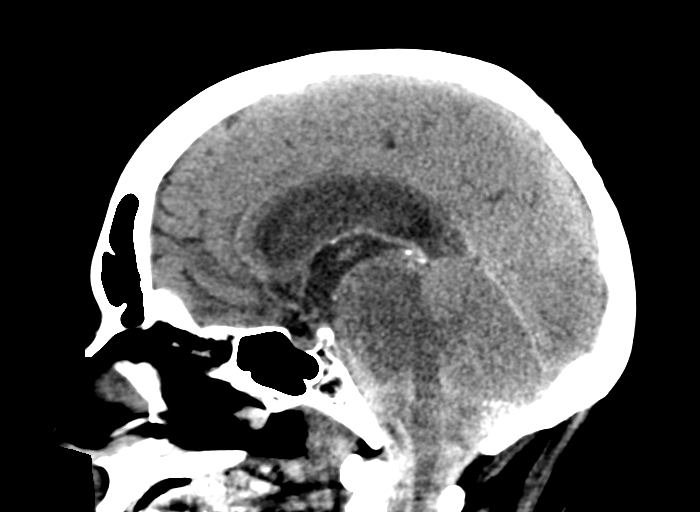
[im 28/50  brain]
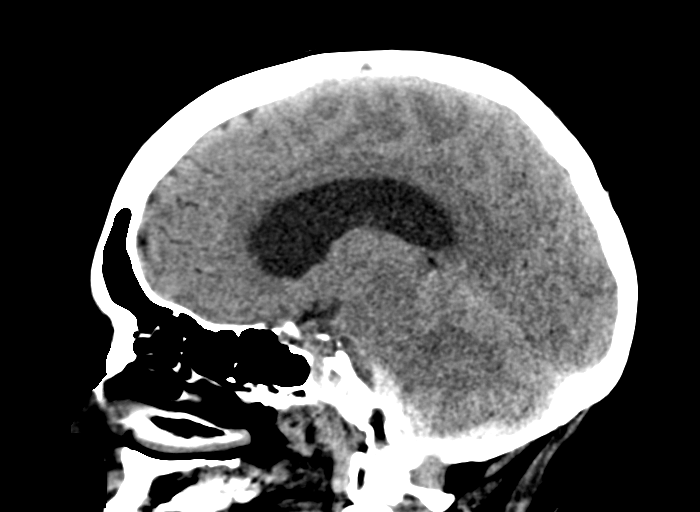

[15 of 47 positions shown; findings below may reference images not displayed]

FINDINGS: Brain: In comparison to recent MRI there is new acute extra-axial
hemorrhage surrounding the upper cervical canal, along the posterior
fossa and tracking along the right tentorial leaflet and posterior
falx. Additionally, there is increased edema at the site of right
midbrain hemorrhage with an element of new ascending transtentorial
herniation as best appreciated on sagittal imaging. Resulting
increased effacement of the fourth ventricle with increased third
and lateral ventriculomegaly, compatible with worsening obstructive
hydrocephalus. No evidence of supratentorial acute large vascular
territory infarct. Evaluation for infarct by CT in the posterior
fossa is limited due to hemorrhage/edema.

Similar hemorrhage in edema in the right frontal lobe in the region
of prior ventriculostomy catheter. Similar layering intraventricular
hemorrhage

Vascular: No hyperdense vessel visible.

Skull: Right retromastoid craniotomy.

Sinuses/Orbits: Visualized sinuses are clear.

Other: Moderate right and small left mastoid effusions.
IMPRESSION: 1. In comparison to recent MRI there is new acute extra-axial
hemorrhage in the upper cervical canal, along the posterior fossa
and tracking along the right tentorial leaflet and posterior falx.
Additionally, there is increased edema at the site of right midbrain
hemorrhage with an element of new ascending transtentorial
herniation.
2. Resulting increased effacement of the fourth ventricle with
increased third and lateral ventriculomegaly, compatible with
worsening obstructive hydrocephalus.
3. Similar intraventricular hemorrhage and hemorrhage/edema at the
site of prior right ventriculostomy.

Findings discussed with  Dr. CHE WAH via telephone at [DATE] p.m.

## 2021-05-03 IMAGING — DX DG CHEST 1V PORT
1 series · 1 of 1 positions shown · non-contrast
Comparison: Chest x-ray [DATE].

CLINICAL DATA: 64-year-old female status post intubation.

EXAM:
PORTABLE CHEST 1 VIEW

[chest]
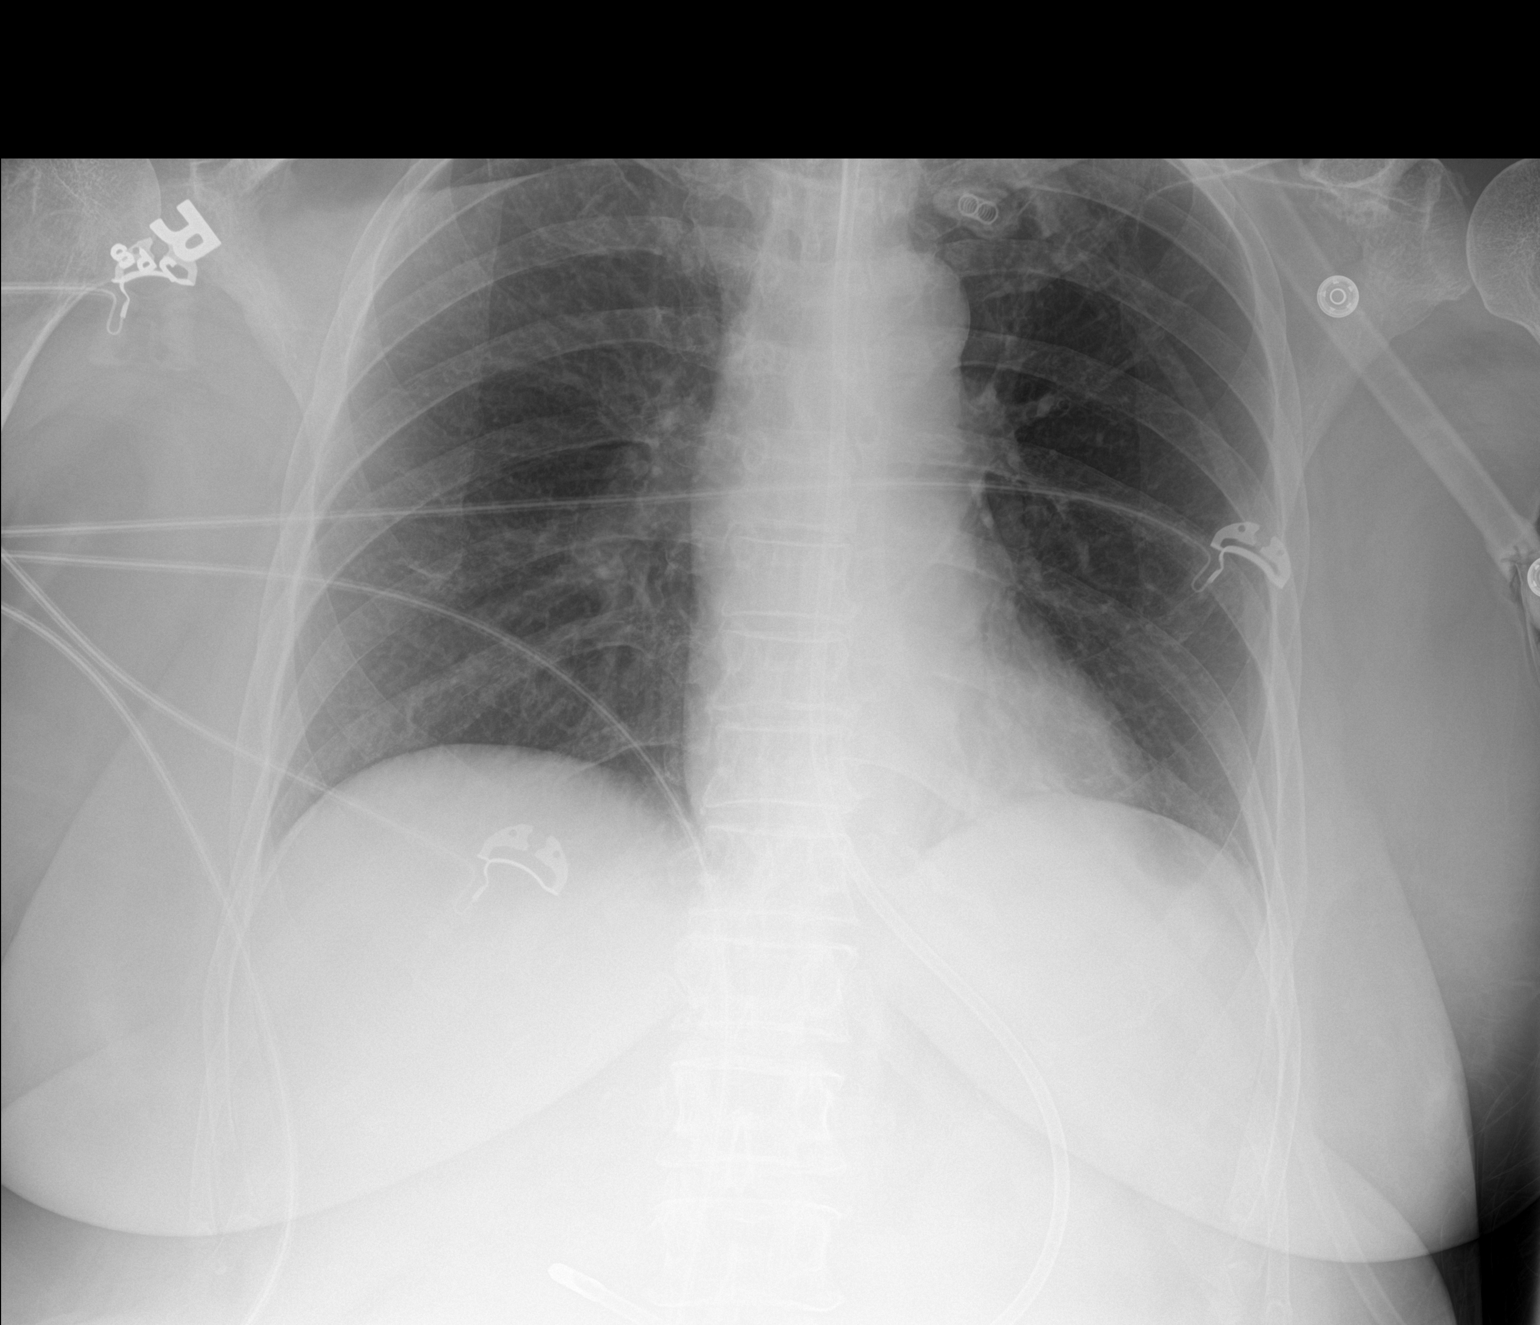

[1 of 1 positions shown; findings below may reference images not displayed]

FINDINGS: An endotracheal tube is in place with tip 4.0 cm above the carina. A
feeding tube is seen extending into the abdomen, however, the tip of
the feeding tube extends below the lower margin of the image. Lung
volumes are normal. No consolidative airspace disease. No pleural
effusions. No pneumothorax. No pulmonary nodule or mass noted.
Pulmonary vasculature and the cardiomediastinal silhouette are
within normal limits.
IMPRESSION: 1. Support apparatus, as above.
2. No radiographic evidence of acute cardiopulmonary disease.

## 2021-05-03 IMAGING — DX DG CHEST 1V PORT
1 series · 1 of 1 positions shown · non-contrast
Comparison: [DATE]

CLINICAL DATA: Central line placement

EXAM:
PORTABLE CHEST 1 VIEW

[chest ap]
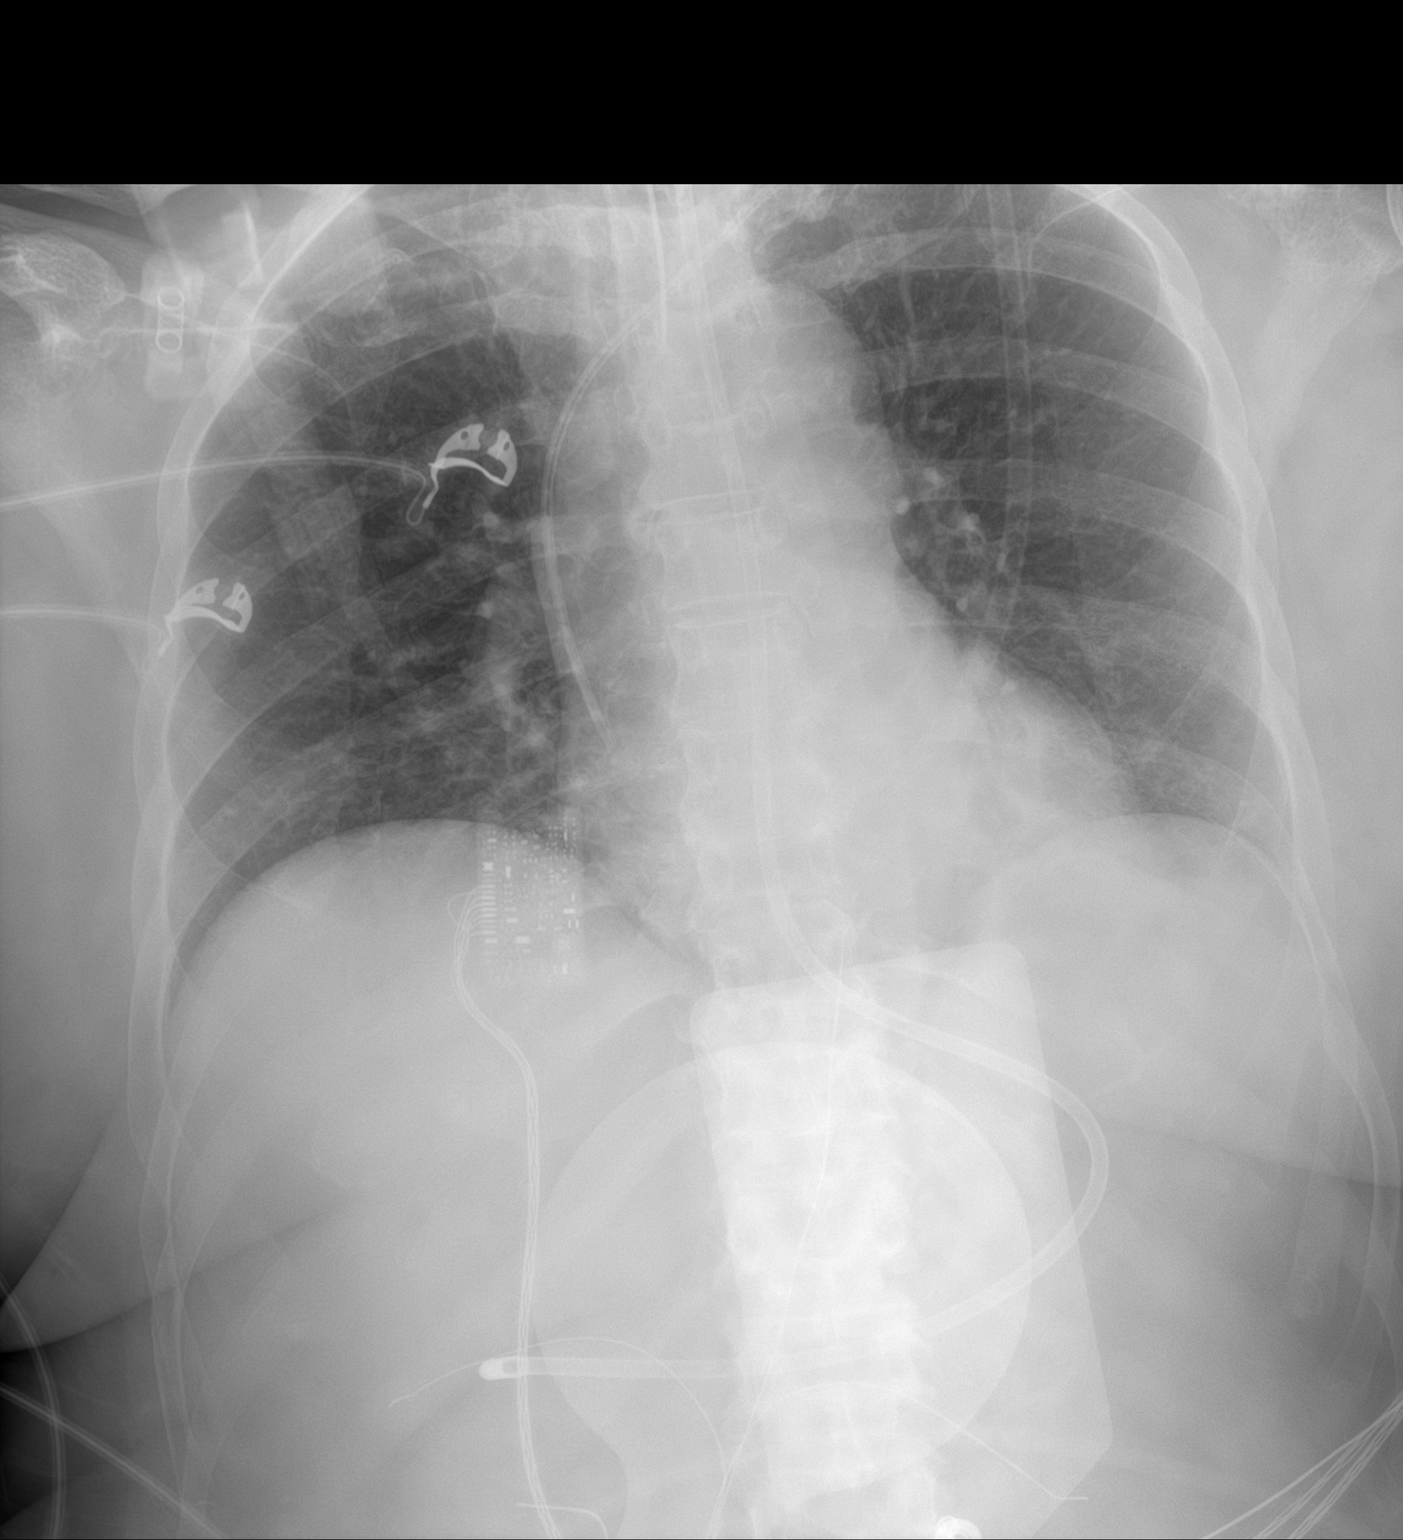

[1 of 1 positions shown; findings below may reference images not displayed]

FINDINGS: Endotracheal tube terminates 3.5 cm above the carina.

Lungs are clear.  No pleural effusion or pneumothorax.

The heart is normal in size.

Left subclavian venous catheter terminates in the upper right
atrium, 2 cm below the cavoatrial junction.

Weighted feeding tube terminates in the duodenal bulb.
IMPRESSION: Endotracheal tube terminates 3.5 cm above the carina.

Left subclavian venous catheter terminates in the upper right
atrium.

Weighted feeding tube terminates in the duodenal bulb.

## 2021-05-03 MED ORDER — POTASSIUM CHLORIDE 20 MEQ PO PACK
40.0000 meq | PACK | Freq: Once | ORAL | Status: AC
  Administered 2021-05-03: 40 meq
  Filled 2021-05-03: qty 2

## 2021-05-03 MED ORDER — POTASSIUM CHLORIDE 20 MEQ PO PACK
20.0000 meq | PACK | Freq: Once | ORAL | Status: AC
  Administered 2021-05-03: 20 meq
  Filled 2021-05-03: qty 1

## 2021-05-03 MED ORDER — SODIUM CHLORIDE 3 % IV SOLN
INTRAVENOUS | Status: DC
  Filled 2021-05-03 (×3): qty 500

## 2021-05-03 MED ORDER — SODIUM CHLORIDE 3 % IV BOLUS
250.0000 mL | Freq: Once | INTRAVENOUS | Status: AC
  Administered 2021-05-03: 250 mL via INTRAVENOUS
  Filled 2021-05-03: qty 500

## 2021-05-03 MED ORDER — ATROPINE SULFATE 1 MG/10ML IJ SOSY
PREFILLED_SYRINGE | INTRAMUSCULAR | Status: AC
  Filled 2021-05-03: qty 10

## 2021-05-03 MED ORDER — VANCOMYCIN HCL 1250 MG/250ML IV SOLN
1250.0000 mg | INTRAVENOUS | Status: DC
  Administered 2021-05-03 – 2021-05-04 (×2): 1250 mg via INTRAVENOUS
  Filled 2021-05-03 (×3): qty 250

## 2021-05-03 MED ORDER — SODIUM CHLORIDE 0.9 % IV SOLN
2.0000 g | Freq: Three times a day (TID) | INTRAVENOUS | Status: DC
  Administered 2021-05-03 – 2021-05-05 (×7): 2 g via INTRAVENOUS
  Filled 2021-05-03 (×7): qty 2

## 2021-05-03 MED ORDER — MANNITOL 25 % IV SOLN
INTRAVENOUS | Status: AC
  Filled 2021-05-03: qty 50

## 2021-05-03 MED ORDER — MANNITOL 25 % IV SOLN: 25.0000 g | Freq: Once | Status: DC

## 2021-05-03 MED ORDER — MANNITOL 20 % IV SOLN
25.0000 g | Freq: Once | Status: DC
  Filled 2021-05-03: qty 125

## 2021-05-03 MED ORDER — ATROPINE SULFATE 1 MG/10ML IJ SOSY
PREFILLED_SYRINGE | INTRAMUSCULAR | Status: AC
  Administered 2021-05-03: 1 mg
  Filled 2021-05-03: qty 10

## 2021-05-03 NOTE — Progress Notes (Signed)
   05/03/21 1000  Clinical Encounter Type  Visited With Family  Visit Type Spiritual support;Social support  Referral From Nurse  Consult/Referral To Chaplain  Spiritual Encounters  Spiritual Needs Prayer;Emotional  The chaplain responded to page to support the husband of the patient. The patient isn't doing well and the chaplain provided social support and spiritual support to the husband (pat). The husband is waiting for his daughter Eustaquio Maize). The chaplain prayed with the husband and provided words of comfort. The husband and his wife has been married for 54 years. The chaplain will follow up as needed.

## 2021-05-03 NOTE — Progress Notes (Signed)
Date and time results received: 05/03/21 1809 (use smartphrase ".now" to insert current time)  Test: csf   Critical Value: wbc 27  Name of Provider Notified: Dr. Lynetta Mare  Orders Received? Or Actions Taken?: MD aware, no new orders at this time.

## 2021-05-03 NOTE — Procedures (Signed)
Central Venous Catheter Insertion Procedure Note  ALEKA TWITTY  [id number]  1955-12-17  Date:05/03/21  Time:12:30 PM   Provider Performing:Barnell Shieh   Procedure: Insertion of Non-tunneled Central Venous Catheter(36556) with US guidance (98721)   Indication(s) Medication administration  Consent Unable to obtain consent due to emergent nature of procedure.  Anesthesia Topical only with 1% lidocaine   Timeout Verified patient identification, verified procedure, site/side was marked, verified correct patient position, special equipment/implants available, medications/allergies/relevant history reviewed, required imaging and test results available.  Sterile Technique Maximal sterile technique including full sterile barrier drape, hand hygiene, sterile gown, sterile gloves, mask, hair covering, sterile ultrasound probe cover (if used).  Procedure Description Area of catheter insertion was cleaned with chlorhexidine and draped in sterile fashion.  With real-time ultrasound guidance a central venous catheter was placed into the left subclavian vein. Nonpulsatile blood flow and easy flushing noted in all ports.  The catheter was sutured in place and sterile dressing applied.      Complications/Tolerance None; patient tolerated the procedure well. Chest X-ray is ordered to verify placement for internal jugular or subclavian cannulation.   Chest x-ray is not ordered for femoral cannulation.  EBL Minimal  Specimen(s) None  Kipp Brood, MD [facility] ICU Physician [place]  Pager: [phone] Or Epic Secure Chat After hours: [phone].  05/03/2021, 12:31 PM

## 2021-05-03 NOTE — Progress Notes (Signed)
STAT EEG completed; results pending; notified Dr Hortense Ramal.

## 2021-05-03 NOTE — Procedures (Signed)
Arterial Catheter Insertion Procedure Note  Tammy Hernandez  [id number]  07-Jan-1956  Date:05/03/21  Time:11:15 AM    Provider Performing: Kathie Dike    Procedure: Insertion of Arterial Line [id number]) without US guidance  Indication(s) Blood pressure monitoring and/or need for frequent ABGs  Consent Unable to obtain consent due to emergent nature of procedure.  Anesthesia None   Time Out Verified patient identification, verified procedure, site/side was marked, verified correct patient position, special equipment/implants available, medications/allergies/relevant history reviewed, required imaging and test results available.   Sterile Technique Maximal sterile technique including full sterile barrier drape, hand hygiene, sterile gown, sterile gloves, mask, hair covering, sterile ultrasound probe cover (if used).   Procedure Description Area of catheter insertion was cleaned with chlorhexidine and draped in sterile fashion. Without real-time ultrasound guidance an arterial catheter was placed into the right radial artery.  Appropriate arterial tracings confirmed on monitor.     Complications/Tolerance None; patient tolerated the procedure well.   EBL Minimal   Specimen(s) None

## 2021-05-03 NOTE — Progress Notes (Signed)
Upon neuro assessment, pupiles are 6 mm billaterally and nonreactive. 3% bolus infusion currently going in based upon previous orders. EVD output was 21 ml for the previous hour. Dr. Zada Finders and Dr. Lynetta Mare notified. Will continue to monitor closely.

## 2021-05-03 NOTE — Progress Notes (Signed)
Patient transported from room 4N17 to CT & back on the ventilator with no problems.

## 2021-05-03 NOTE — Procedures (Signed)
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 EEG to evaluate for seizure.  Level of alertness:  comatose  AEDs during EEG study: None  Technical aspects: This EEG study was done with scalp electrodes positioned according to the 10-20 International system of electrode placement. Electrical activity was acquired at a sampling rate of 500Hz  and reviewed with a high frequency filter of 70Hz  and a low frequency filter of 1Hz . EEG data were recorded continuously and digitally stored.   Description: EEG showed continuous generalized and lateralized right hemisphere 3 to 6 Hz theta-delta slowing. Hyperventilation and photic stimulation were not performed.     ABNORMALITY - Continuous slow, generalized  and lateralized right hemisphere  IMPRESSION: This study is suggestive of cortical dysfunction arising from right hemsiphere likely secondary to underlying structural abnormality as well as severe diffuse encephalopathy, nonspecific etiology. No seizures or epileptiform discharges were seen throughout the recording.  Jamey Harman Barbra Sarks

## 2021-05-03 NOTE — Progress Notes (Signed)
Pt HR brady to 20s, multiple RN to bedside, pt with good pulse, pt hypotensive. Atropine given; HR responsive to 90s. MD R. Agarwala to bedside immediately. Pupils unchanged. Pt hooked to zoll at this time. MD to place central line at this time.  BP stabilizing, levophed resumed and titrated. Family at bedside and updated.

## 2021-05-03 NOTE — Progress Notes (Signed)
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   Patient tolerated procedure well and was extubated in the OR; unfortunately, remained too somnolent to protect her airway and was subsequently reintubated. PCCM consulted for vent management.  Significant Hospital Events: Including procedures, antibiotic start and stop dates in addition to other pertinent events   . 5/17 - POD#0 R craniotomy for petroclival meningioma resection. Extubated in OR, too somnolent to protect her airway, reintubated. Admit to Neuro ICU post-op. . 04/21/2021 interventricular drain to be placed by neurosurgery . 04/21/2021 noted MRI . 04/21/2021 arterial line discontinued . 5/21 - Intermittently following commands . 5/22 - C/f EVD clogging . 5/23 - More awake this AM, following commands in LE. Decreased EVD output. Repeat CT Head to assess ventricles. . 5/24 - CT Head with improved appearance of ventricles per NSGY. Decreased FiO2 need, stable at 40%. Still with waxing/waning mental status. . 5/25 Able to tolerate PSV, still somnolent, EVD removed . 5/26 More somnolent today, CTH without significant changes, concern for developing infection, Cefepime initiated . 5/27 continued poor neuro status, increased WBC, respiratory culture with gm neg rods and few gm positives, but MRSA neg, narrow to unasyn . 5/28 hypotension and fevers, concern for worsening septic shock. . 5/30 less responsive. Episode of hypotension and bradycardia.  Worsening cerebral edema on CT  Interim  History / Subjective:  Episode of sudden bradycardia and hypotension which resolved spontaneously.  Less responsive on exam today.  Objective:  Blood pressure (!) 150/86, pulse 85, temperature 98.3 F (36.8 C), temperature source Axillary, resp. rate 16, height 5\' 3"  (1.6 m), weight 72.9 kg, SpO2 100 %.    Vent Mode: PRVC FiO2 (%):  [40 %] 40 % Set Rate:  [16 bmp] 16 bmp Vt Set:  [420 mL] 420 mL PEEP:  [5 cmH20] 5 cmH20 Plateau Pressure:  [14 cmH20-16 cmH20] 15 cmH20   Intake/Output Summary (Last 24 hours) at 05/03/2021 1210 Last data filed at 05/03/2021 0941 Gross per 24 hour  Intake 2181.51 ml  Output 1750 ml  Net 431.51 ml   Filed Weights   04/30/21 0358 05/02/21 0400 05/03/21 0500  Weight: 70.7 kg 73 kg 72.9 kg     General:  Critically ill intubated, not responsive HEENT: ETT to vent, s/p right craniotomy, incisions CDI Neuro: off sedation, not responsive, no purposeful movements CV: RRR no mrg PULM: no wheezes or crackles, sounds of mechanical ventilation bilaterally GI: soft, bsx4 active  Extremities: warm/dry, no edema  Skin: no rashes or lesions  Point-of-care echocardiogram shows normal cardiac function.  RV normal.  IVC not distended.  Labs/imaging that I have personally reviewed: (right click and "Reselect all SmartList Selections" daily)   Sodium 142 Potassium 3.4 Mild transaminitis CT head personally reviewed and discussed with radiologist showing worsening infratentorial edema with upward herniation.  Increased volume of ventricular system.   Resolved Hospital Problem List:   Hyponatremia   Assessment & Plan:   Critically ill due to increasing cerebral edema with herniation and hydrocephalus following resection of petroclival meningioma, now requiring mechanical ventilation and initiation of hypertonic saline.  Distributive shock secondary to above requiring titration of vasopressors No clear sign of infection Type 2 diabetes  Plan:  -Initiate 3%  saline bolus followed by infusion -Neurosurgery to reevaluate and place EVD if indicated -Place arterial line, check ABG and initiate mild hyperventilation. -Continue empiric antibiotics.   Best Practice: (right click and "Reselect all SmartList Selections" daily)    Daily Goals Checklist  Pain/Anxiety/Delirium protocol (if indicated): None Neuro vitals: every 1 hours AED's: None VAP protocol (if indicated): On board Respiratory support goals: Full support.  No weaning.  Mild hyperventilation. Blood pressure target: MAP greater than 65. DVT prophylaxis: Heparin subcu Nutrition Status: On tube feeds GI prophylaxis: Famotidine Fluid status goals: Allow autoregulation.  Starting 3% saline. Urinary catheter: Assessment of intravascular volume Central lines: Required for 3% saline Glucose control: Adequate with basal bolus insulin Mobility/therapy needs: Bedrest Antibiotic de-escalation: 7 days empiric antibiotics Home medication reconciliation: On hold Daily labs: CBC, BNP Code Status: Full code Family Communication: Updated husband prior to CT scan. Disposition: ICU    Critical care time: 39 minutes   The patient is critically ill due to respiratory failure, septic shock, .  Critical care was necessary to treat or prevent imminent or life-threatening deterioration.  Critical care was time spent personally by me on the following activities: development of treatment plan with patient and/or surrogate as well as nursing, discussions with consultants, evaluation of patient's response to treatment, examination of patient, obtaining history from patient or surrogate, ordering and performing treatments and interventions, ordering and review of laboratory studies, ordering and review of radiographic studies, pulse oximetry, re-evaluation of patient's condition and participation in multidisciplinary rounds.   Critical Care Time devoted to patient care services described in this note is 39  minutes. This time reflects time of care of this [place] . This critical care time does not reflect separately billable procedures or procedure time, teaching time or supervisory time of PA/NP/Med student/Med Resident etc but could involve care discussion time.       Kipp Brood [facility] 05/03/2021 12:10 PM  Pager: see AMION  If no response to pager , please call critical care on call (see AMION) until 7pm After 7:00 pm call Elink

## 2021-05-03 NOTE — Progress Notes (Signed)
PHARMACY - PHYSICIAN COMMUNICATION CRITICAL VALUE ALERT - BLOOD CULTURE IDENTIFICATION (BCID)  Tammy Hernandez is an 65 y.o. female who presented to [facility] on 05/02/2021 for neurosurgery.  Assessment:  Now w/ 1 of 4 blood cx bottles growing methicillin-resistant staph epi; this is likely a contaminant but pt already on ABX for sepsis.  Current antibiotics: vancomycin and cefepime  Changes to prescribed antibiotics recommended:  Current antibiotics are likely to cover the isolated organism.  Consider de-escalation soon.  Results for orders placed or performed during the hospital encounter of 04/28/2021  Blood Culture ID Panel (Reflexed) (Collected: 05/01/2021  4:15 PM)  Result Value Ref Range   Enterococcus faecalis NOT DETECTED NOT DETECTED   Enterococcus Faecium NOT DETECTED NOT DETECTED   Listeria monocytogenes NOT DETECTED NOT DETECTED   Staphylococcus species DETECTED (A) NOT DETECTED   Staphylococcus aureus (BCID) NOT DETECTED NOT DETECTED   Staphylococcus epidermidis DETECTED (A) NOT DETECTED   Staphylococcus lugdunensis NOT DETECTED NOT DETECTED   Streptococcus species NOT DETECTED NOT DETECTED   Streptococcus agalactiae NOT DETECTED NOT DETECTED   Streptococcus pneumoniae NOT DETECTED NOT DETECTED   Streptococcus pyogenes NOT DETECTED NOT DETECTED   A.calcoaceticus-baumannii NOT DETECTED NOT DETECTED   Bacteroides fragilis NOT DETECTED NOT DETECTED   Enterobacterales NOT DETECTED NOT DETECTED   Enterobacter cloacae complex NOT DETECTED NOT DETECTED   Escherichia coli NOT DETECTED NOT DETECTED   Klebsiella aerogenes NOT DETECTED NOT DETECTED   Klebsiella oxytoca NOT DETECTED NOT DETECTED   Klebsiella pneumoniae NOT DETECTED NOT DETECTED   Proteus species NOT DETECTED NOT DETECTED   Salmonella species NOT DETECTED NOT DETECTED   Serratia marcescens NOT DETECTED NOT DETECTED   Haemophilus influenzae NOT DETECTED NOT DETECTED   Neisseria meningitidis NOT DETECTED NOT  DETECTED   Pseudomonas aeruginosa NOT DETECTED NOT DETECTED   Stenotrophomonas maltophilia NOT DETECTED NOT DETECTED   Candida albicans NOT DETECTED NOT DETECTED   Candida auris NOT DETECTED NOT DETECTED   Candida glabrata NOT DETECTED NOT DETECTED   Candida krusei NOT DETECTED NOT DETECTED   Candida parapsilosis NOT DETECTED NOT DETECTED   Candida tropicalis NOT DETECTED NOT DETECTED   Cryptococcus neoformans/gattii NOT DETECTED NOT DETECTED   Methicillin resistance mecA/C DETECTED (A) NOT DETECTED    Wynona Neat, PharmD, BCPS  05/03/2021  2:20 AM

## 2021-05-03 NOTE — Progress Notes (Signed)
Pharmacy Antibiotic Note   Tammy Hernandez is a 65 y.o. female admitted on 05/04/2021 with progressive ataxia, R sided hearing loss and myelopathy.  Patient was initially started on cefepime for possible PNA, then transitioned to Unasyn to cover for abdominal source given unexplained elevated LFTs.  Concern for developing sepsis given onset of hemodynamic instability and Pharmacy consulted to broaden antibiotics to vancomycin and cefepime.  MRSE in blood culture likely a contaminant.     Renal function improving, afebrile, WBC up to 39.9.  Plan: Increase vanc to 1250mg  IV Q24H for AUC 459 using SCr 0.8 Increase cefepime to 2gm IV Q8H Monitor renal fxn, clinical progress, vanc levels as indicated  Height: 5\' 3"  (160 cm) Weight: 72.9 kg (160 lb 11.5 oz) IBW/kg (Calculated) : 52.4  Temp (24hrs), Avg:97.7 F (36.5 C), Min:97 F (36.1 C), Max:98.2 F (36.8 C)  Recent Labs  Lab 04/29/21 0309 04/29/21 0439 04/30/21 0140 05/01/21 0208 05/02/21 0342 05/03/21 0150  WBC 22.9*  --  31.6* 39.9*  --   --   CREATININE  --  0.61 0.75 1.00 1.15* 0.74    Estimated Creatinine Clearance: 66.2 mL/min (by C-G formula based on SCr of 0.74 mg/dL).    No Known Allergies  Cefepime 5/26 >> 5/27, 5/28>> Unasyn 5/27 >>5/28 Vanc 5/28>>  5/26 MRSA PCR - negative 5/26 TA - negative 5/28 BCx - 1/4 MRSE 5/29 C.diff - negative  Zhania Shaheen D. Mina Marble, PharmD, BCPS, [place] 05/03/2021, 9:34 AM

## 2021-05-03 NOTE — Progress Notes (Signed)
Pt seen on evening rounds in between cases, no improvement in exam, review of her prior [employer] shows new hemorrhage (compared to MRI yesterday) with resulting herniation. On exam, she is fixed and dilated, no corneals / cough / gag.   Unclear cause, atypical pattern, especially 14 days out from surgery. The MRI from yesterday had contrast and the sinuses were patent, I'm a bit at a loss as to the cause.  Regardless, discussed this with the patient's husband just now via telephone. I explained she doesn't have brainstem reflexes and I don't think they'll return, thus she won't ever regain consciousness. Given the late hour, we agreed to speak some more in the morning at a more reasonable time.

## 2021-05-03 NOTE — Progress Notes (Addendum)
Pt had a decompensation this morning, repeat CTH was obtained which showed hydrocephalus, will replace R F EVD at bedside, discussed w/ patient's family. Will send off CSF studies & Cx off fresh EVD to evaluate for ventriculitis.   Prior to EVD placement, pt had fixed and dilated pupils, midpoint, with no corneals and unable to elicit a cough with inline suctioning.   R EVD site was emergently prepped & draped in a sterile fashion, prior incision opened, catheter passed through prior tract with good return of CSF with some old blood products, under very high pressure, drained down to normal pressure and CSF studies sent.   -EVD at +5 -will have to see how her exam improves later today after some time with normal ICPs

## 2021-05-03 NOTE — Progress Notes (Signed)
EEG attempted, per Nurse CT first.

## 2021-05-04 DIAGNOSIS — J9601 Acute respiratory failure with hypoxia: Secondary | ICD-10-CM | POA: Diagnosis not present

## 2021-05-04 DIAGNOSIS — D496 Neoplasm of unspecified behavior of brain: Secondary | ICD-10-CM

## 2021-05-04 LAB — COMPREHENSIVE METABOLIC PANEL
ALT: 268 U/L — ABNORMAL HIGH (ref 0–44)
AST: 79 U/L — ABNORMAL HIGH (ref 15–41)
Albumin: 1.9 g/dL — ABNORMAL LOW (ref 3.5–5.0)
Alkaline Phosphatase: 96 U/L (ref 38–126)
Anion gap: 8 (ref 5–15)
BUN: 43 mg/dL — ABNORMAL HIGH (ref 8–23)
CO2: 21 mmol/L — ABNORMAL LOW (ref 22–32)
Calcium: 7.6 mg/dL — ABNORMAL LOW (ref 8.9–10.3)
Chloride: 124 mmol/L — ABNORMAL HIGH (ref 98–111)
Creatinine, Ser: 0.61 mg/dL (ref 0.44–1.00)
GFR, Estimated: >60 mL/min (ref >60)
Glucose, Bld: 139 mg/dL — ABNORMAL HIGH (ref 70–99)
Potassium: 3.6 mmol/L (ref 3.5–5.1)
Sodium: 153 mmol/L — ABNORMAL HIGH (ref 135–145)
Total Bilirubin: 0.6 mg/dL (ref 0.3–1.2)
Total Protein: 4.2 g/dL — ABNORMAL LOW (ref 6.5–8.1)

## 2021-05-04 LAB — CBC WITH DIFFERENTIAL/PLATELET
Abs Immature Granulocytes: 0 10*3/uL (ref 0.00–0.07)
Basophils Absolute: 0 10*3/uL (ref 0.0–0.1)
Basophils Relative: 0 %
Eosinophils Absolute: 0 10*3/uL (ref 0.0–0.5)
Eosinophils Relative: 0 %
HCT: 31.8 % — ABNORMAL LOW (ref 36.0–46.0)
Hemoglobin: 10.2 g/dL — ABNORMAL LOW (ref 12.0–15.0)
Lymphocytes Relative: 3 %
Lymphs Abs: 0.9 10*3/uL (ref 0.7–4.0)
MCH: 30.8 pg (ref 26.0–34.0)
MCHC: 32.1 g/dL (ref 30.0–36.0)
MCV: 96.1 fL (ref 80.0–100.0)
Monocytes Absolute: 1.3 10*3/uL — ABNORMAL HIGH (ref 0.1–1.0)
Monocytes Relative: 4 %
Neutro Abs: 29.2 10*3/uL — ABNORMAL HIGH (ref 1.7–7.7)
Neutrophils Relative %: 93 %
Platelets: 273 10*3/uL (ref 150–400)
RBC: 3.31 MIL/uL — ABNORMAL LOW (ref 3.87–5.11)
RDW: 13.2 % (ref 11.5–15.5)
WBC: 31.4 10*3/uL — ABNORMAL HIGH (ref 4.0–10.5)
nRBC: 0.1 % (ref 0.0–0.2)
nRBC: 1 /100 WBC — ABNORMAL HIGH

## 2021-05-04 LAB — SODIUM
Sodium: 152 mmol/L — ABNORMAL HIGH (ref 135–145)
Sodium: 158 mmol/L — ABNORMAL HIGH (ref 135–145)
Sodium: 160 mmol/L — ABNORMAL HIGH (ref 135–145)

## 2021-05-04 LAB — PHOSPHORUS: Phosphorus: 1.8 mg/dL — ABNORMAL LOW (ref 2.5–4.6)

## 2021-05-04 LAB — GLUCOSE, CAPILLARY
Glucose-Capillary: 112 mg/dL — ABNORMAL HIGH (ref 70–99)
Glucose-Capillary: 141 mg/dL — ABNORMAL HIGH (ref 70–99)
Glucose-Capillary: 161 mg/dL — ABNORMAL HIGH (ref 70–99)
Glucose-Capillary: 149 mg/dL — ABNORMAL HIGH (ref 70–99)
Glucose-Capillary: 126 mg/dL — ABNORMAL HIGH (ref 70–99)
Glucose-Capillary: 110 mg/dL — ABNORMAL HIGH (ref 70–99)

## 2021-05-04 LAB — MAGNESIUM: Magnesium: 2.9 mg/dL — ABNORMAL HIGH (ref 1.7–2.4)

## 2021-05-04 MED ORDER — POTASSIUM PHOSPHATES 15 MMOLE/5ML IV SOLN
30.0000 mmol | Freq: Once | INTRAVENOUS | Status: AC
  Administered 2021-05-04: 30 mmol via INTRAVENOUS
  Filled 2021-05-04: qty 10

## 2021-05-04 NOTE — Progress Notes (Signed)
Nutrition Follow-up  DOCUMENTATION CODES:   Not applicable  INTERVENTION:   Tube feeding via Cortrak tube: Vital AF 1.2 at 50 ml/h (1200 ml per day) Prosource TF 45 ml once daily  Provides 1480 kcal, 101 gm protein, 973 ml free water daily.  200 ml free water every 4 hours  Total free water: 2173 ml   NUTRITION DIAGNOSIS:   Inadequate oral intake related to inability to eat as evidenced by NPO status. Ongoing.   GOAL:   Patient will meet greater than or equal to 90% of their needs Met with TF.   MONITOR:   Vent status,TF tolerance,Labs  REASON FOR ASSESSMENT:   Ventilator,Consult Enteral/tube feeding initiation and management  ASSESSMENT:   64 yo female admitted S/P R craniotomy with resection of meningioma. Course complicated by hydrocephalus and acute respiratory failure requiring intubation. PMH includes DM-2, HLD, hypothyroidism, HTN, recent diagnosis of meningioma.  Pt discussed during ICU rounds and with RN.  Noted plan for terminal extubation 6/1  5/25 cortrak placed; tip gastric    Medications reviewed and include: SSI, novolog Labs reviewed CBG's: 166-194  EVD: 111 ml   Diet Order:   Diet Order    None      EDUCATION NEEDS:   Not appropriate for education at this time  Skin:  Skin Assessment: Reviewed RN Assessment  Last BM:  200 ml via rectal tube  Height:   Ht Readings from Last 1 Encounters:  04/29/2021 5' 3" (1.6 m)    Weight:   Wt Readings from Last 1 Encounters:  05/04/21 75.3 kg    Ideal Body Weight:  52.3 kg  BMI:  Body mass index is 29.41 kg/m.  Estimated Nutritional Needs:   Kcal:  1425  Protein:  100-115 gm  Fluid:  >/= 1.5 L  Heather P., RD, LDN, CNSC See AMiON for contact information   

## 2021-05-04 NOTE — Progress Notes (Signed)
Patient's EVD drain has not had any output since 2300 which was 91ml. Notified Dr. Zada Finders. MD stated no interventions were needed at this time.  Patient's sodium level at 0007 came back at 152. Although within the range of 150-155 as stated in the orders, it has increased >6 within 4 hours from previous Na level taken at 1736 where it was 145. Notified Dr. Zada Finders. MD stated to stop 3% NaCl fluids, which were stopped at 0208.   Wyn Quaker, [place] 05/04/21 [phone]

## 2021-05-04 NOTE — Progress Notes (Signed)
Neurosurgery Service Progress Note  Subjective: NAE ON, no improvement unfortunately  Objective: Vitals:   05/04/21 0800 05/04/21 0840 05/04/21 0900 05/04/21 1000  BP: 129/75  94/61 (!) 102/58  Pulse: 62  (!) 57 67  Resp: (!) 21  16 16   Temp: (!) 93 F (33.9 C)     TempSrc: Rectal     SpO2: 100% 100% 100% 100%  Weight:      Height:        Physical Exam: Intubated, pupils fixed and dilated OU, no corneal / cough / gag, no response to cold water calorics  Assessment & Plan: 65 y.o. 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 CTH with pneumocephalus, blood products in the resection cavity, improvement in aqueductal stenosis but persistent supratentorial ventriculomegaly, 5/18 s/p R F EVD for persistent depressed mental status. 5/19 MRI w/ good resection, using rough measurements 86% of tumor volume, expected diffusion along the resection cavity, small dot at the right inf vs sup colliculus, no obvious perforator strokes, 5/23 rpt CTH with resolution of aqueductal stenosis, 5/25 CTH stable ventricles, passed clamp trial, EVD removed, 5/26 rpt CTH stable, 5/30 acute change in exam, rpt CTH w/ hemorrhage and hydrocephalus, EVD replaced, exam remains poor  -discussed w/ family at bedside, they understand the dire prognosis, they want to proceed with terminal extubation 6/1 -EVD non-functioning, can discontinue aggressive measures in the meantim, can d/c hypertonic saline, family agreed w/ making her DNR -CCM recs  Judith Part  05/04/21 10:56 AM

## 2021-05-04 NOTE — Progress Notes (Signed)
PT Cancellation Note  Patient Details Name: Tammy Hernandez MRN: [id number] DOB: 1956-04-06   Cancelled Treatment:    Reason Eval/Treat Not Completed: Medical issues which prohibited therapy - PT to check back as schedule allows.  Stacie Glaze, PT DPT Acute Rehabilitation Services Pager [phone]  Office [phone]    Louis Matte 05/04/2021, 9:13 AM

## 2021-05-04 NOTE — Progress Notes (Signed)
Will not resume hypertonic saline per Dr. Zada Finders. Gualberto Wahlen, Rande Brunt, RN

## 2021-05-04 NOTE — Discharge Summary (Signed)
Discharge Summary  Date of Admission: 04/18/2021  Date of Discharge: 05/22/2021  Attending Physician: Emelda Brothers, MD  Hospital Course: Patient was admitted following craniotomy and resection of a petroclival meningioma. Post-operatively, she was re-intubated immediately post-op due to somnolence, but after 24h awoke and followed commands. Post-op CTH showed pneumocephalus, blood products in the resection cavity, improvement in aqueductal stenosis but persistent supratentorial ventriculomegaly so a right frontal EVD was placed on 5/18. Post-op MRI on 5/19 showed good resection, expected subtotal resection with 80-90% of tuomr resected, expected diffusion along the resection cavity, no obvious perforator strokes. Steroids helped with the edema and repeat CTH on 5/23 showed resolution of aqueductal stenosis. She passed a clamp trial, the EVD was removed, she was still following commands but remained too weak to extubate. She developed pneumonia and then a sepsis-like picture, which was treated with antibiotics and pressors, no clear source was found aside form some colitis on her CT CAP, repeat MRI looking for infection was performed on 5/29 with continued improvement, no evidence of infection, no new hemorrhage. On 5/30 she had an acute change in exam and became unresponsive, rpt CTH showed hemorrhage in the posterior fossa with resulting hydrocephalus, EVD replaced. Her exam unfortunately remained poor without brainstem reflexes after treating the hydrocephalus. This was discussed with the patient's family, who wished to proceed with organ donation after declaration of brain death. Her exam was consistent with brain death, but due to a confounder of hypernatremia due to DI, a radionucleotide angiogram was performed, which confirmed lack of intracranial flow and brain death. She was therefore declared dead on May 22, 2021 at 15:27.   Discharge diagnosis: Brain tumor  Judith Part, MD 05/04/21 10:59  AM

## 2021-05-04 NOTE — Progress Notes (Signed)
NAME:  Tammy Hernandez MRN:  [id number] DOB:  [birth year] LOS: 98 ADMISSION DATE:  04/29/2021 CONSULTATION DATE:  05/01/2021 REFERRING MD:  Zada Finders CHIEF COMPLAINT:  Acute respiratory failure s/p craniotomy  History of Present Illness:  65 year old female with PMHx significant for T2DM, HLD, hypothyroidism and recent diagnosis of petroclival meningioma as evidenced by ataxia, R-sided hearing loss, myelopathy and dysphagia who presented to [facility] 5/17 for R retrosigmoid craniotomy for resection of meningioma.   Patient tolerated procedure well and was extubated in the OR; unfortunately, remained too somnolent to protect her airway and was subsequently reintubated. PCCM consulted for vent management.  Significant Hospital Events: Including procedures, antibiotic start and stop dates in addition to other pertinent events   . 5/17 - POD#0 R craniotomy for petroclival meningioma resection. Extubated in OR, too somnolent to protect her airway, reintubated. Admit to Neuro ICU post-op. . 04/21/2021 interventricular drain to be placed by neurosurgery . 04/21/2021 noted MRI . 04/21/2021 arterial line discontinued . 5/21 - Intermittently following commands . 5/22 - C/f EVD clogging . 5/23 - More awake this AM, following commands in LE. Decreased EVD output. Repeat CT Head to assess ventricles. . 5/24 - CT Head with improved appearance of ventricles per NSGY. Decreased FiO2 need, stable at 40%. Still with waxing/waning mental status. . 5/25 Able to tolerate PSV, still somnolent, EVD removed . 5/26 More somnolent today, CTH without significant changes, concern for developing infection, Cefepime initiated . 5/27 continued poor neuro status, increased WBC, respiratory culture with gm neg rods and few gm positives, but MRSA neg, narrow to unasyn . 5/28 hypotension and fevers, concern for worsening septic shock. . 5/30 less responsive. Episode of hypotension and bradycardia.  Worsening cerebral edema on CT . 5/31:  Neurosurgery discussed with family to make comfort care tomorrow and changed code status to DNR  Interim History / Subjective:   Neurosurgery had family conversation with family. Plan to do comfort care June 1 in the morning and changed code status to DNR.   Patient non-responsive. On pressors.  Objective:  Blood pressure (!) 102/58, pulse 67, temperature (!) 93 F (33.9 C), temperature source Rectal, resp. rate 16, height 5\' 3"  (1.6 m), weight 75.3 kg, SpO2 100 %.    Vent Mode: PRVC FiO2 (%):  [30 %-40 %] 30 % Set Rate:  [6 bmp-16 bmp] 16 bmp Vt Set:  [420 mL] 420 mL PEEP:  [5 cmH20] 5 cmH20 Plateau Pressure:  [15 cmH20-17 cmH20] 16 cmH20   Intake/Output Summary (Last 24 hours) at 05/04/2021 1024 Last data filed at 05/04/2021 1000 Gross per 24 hour  Intake 4190.01 ml  Output 2662 ml  Net 1528.01 ml   Filed Weights   05/02/21 0400 05/03/21 0500 05/04/21 0410  Weight: 73 kg 72.9 kg 75.3 kg    General:  Critically ill appearing. Intubated HEENT: MM pink/moist. ET tube and EVD in place Neuro: Off sedation; not responsive; pupils non responsive and non reactive to light. Pupils 4 mm. No corneal reflex. No cough/gag reflex. CV: s1s2, RRR, no m/r/g PULM:  Dim clear bilaterally; On mech vent PRVC; spontaneous respirations present GI: soft, bsx4 active  Extremities: warm/dry, no edema  Skin: no rashes or lesions    Labs/imaging that I have personally reviewed: (right click and "Reselect all SmartList Selections" daily)   Na 153 BUN 43 from 65; Creatinine 0.61 from 0.74 (Improving) AST 79, ALT 268 trending down WBC 31.4 from 39.9 Hgb 10.2 from 10.5  Resolved Hospital Problem List:  Hyponatremia   Assessment & Plan:   Critically ill due to increasing cerebral edema with herniation and hydrocephalus following resection of petroclival meningioma, now requiring mechanical ventilation and initiation of hypertonic saline. Acute Respiratory Failure with hypoxia s/p  intubation Distributive shock secondary to above: requiring titration of vasopressors; No clear sign of infection Type 2 diabetes AKI (improving) Elevated Liver enzymes (improving) Hx of Hypothyroidism Plan: -Plan to do comfort care tomorrow morning (6/1) and made DNR due to poor prognosis -Neurosurgery following -Continue Levo with ceiling to 20 for MAP >65; If increasing amounts may have to call family in sooner -Continue Vanc and Cefepime -SSI and CBG monitoring -continue home synthroid    Best Practice: (right click and "Reselect all SmartList Selections" daily)    Daily Goals Checklist  Pain/Anxiety/Delirium protocol (if indicated): None Neuro vitals: every 1 hours AED's: None VAP protocol (if indicated): On board. DVT prophylaxis: Heparin subcu Nutrition Status: tube feeds GI prophylaxis: Famotidine Urinary catheter: in place Central lines: Required Glucose control: SSI yes Mobility/therapy needs: Bedrest Antibiotic de-escalation: 7 days empiric antibiotics Code Status: DNR Family Communication: Neurosurgery discussed to make comfort care in morning and made DNR Disposition: ICU    Critical care time: 35 minutes      Guinevere Ferrari Pulmonary and Critical Care Medicine 05/04/2021 10:24 AM  Pager: see AMION  If no response to pager , please call critical care on call (see AMION) until 7pm After 7:00 pm call Elink

## 2021-05-05 ENCOUNTER — Telehealth (INDEPENDENT_AMBULATORY_CARE_PROVIDER_SITE_OTHER): Payer: Self-pay

## 2021-05-05 ENCOUNTER — Inpatient Hospital Stay (HOSPITAL_COMMUNITY): Payer: 59

## 2021-05-05 ENCOUNTER — Other Ambulatory Visit (HOSPITAL_COMMUNITY): Payer: 59

## 2021-05-05 DIAGNOSIS — J90 Pleural effusion, not elsewhere classified: Secondary | ICD-10-CM | POA: Diagnosis not present

## 2021-05-05 DIAGNOSIS — J9601 Acute respiratory failure with hypoxia: Secondary | ICD-10-CM | POA: Diagnosis not present

## 2021-05-05 LAB — POCT I-STAT 7, (LYTES, BLD GAS, ICA,H+H)
Acid-base deficit: 6 mmol/L — ABNORMAL HIGH (ref 0.0–2.0)
Acid-base deficit: 7 mmol/L — ABNORMAL HIGH (ref 0.0–2.0)
Acid-base deficit: 2 mmol/L (ref 0.0–2.0)
Bicarbonate: 18 mmol/L — ABNORMAL LOW (ref 20.0–28.0)
Bicarbonate: 19 mmol/L — ABNORMAL LOW (ref 20.0–28.0)
Bicarbonate: 22.9 mmol/L (ref 20.0–28.0)
Calcium, Ion: 1.3 mmol/L (ref 1.15–1.40)
Calcium, Ion: 1.27 mmol/L (ref 1.15–1.40)
Calcium, Ion: 1.27 mmol/L (ref 1.15–1.40)
HCT: 24 % — ABNORMAL LOW (ref 36.0–46.0)
HCT: 26 % — ABNORMAL LOW (ref 36.0–46.0)
HCT: 26 % — ABNORMAL LOW (ref 36.0–46.0)
Hemoglobin: 8.2 g/dL — ABNORMAL LOW (ref 12.0–15.0)
Hemoglobin: 8.8 g/dL — ABNORMAL LOW (ref 12.0–15.0)
Hemoglobin: 8.8 g/dL — ABNORMAL LOW (ref 12.0–15.0)
O2 Saturation: 98 %
O2 Saturation: 100 %
O2 Saturation: 100 %
Patient temperature: 97.7
Patient temperature: 97.7
Potassium: 3.9 mmol/L (ref 3.5–5.1)
Potassium: 4.4 mmol/L (ref 3.5–5.1)
Potassium: 4.2 mmol/L (ref 3.5–5.1)
Sodium: 161 mmol/L (ref 135–145)
Sodium: 156 mmol/L — ABNORMAL HIGH (ref 135–145)
Sodium: 161 mmol/L (ref 135–145)
TCO2: 19 mmol/L — ABNORMAL LOW (ref 22–32)
TCO2: 20 mmol/L — ABNORMAL LOW (ref 22–32)
TCO2: 24 mmol/L (ref 22–32)
pCO2 arterial: 30.8 mmHg — ABNORMAL LOW (ref 32.0–48.0)
pCO2 arterial: 39.3 mmHg (ref 32.0–48.0)
pCO2 arterial: 39.1 mmHg (ref 32.0–48.0)
pH, Arterial: 7.375 (ref 7.350–7.450)
pH, Arterial: 7.289 — ABNORMAL LOW (ref 7.350–7.450)
pH, Arterial: 7.373 (ref 7.350–7.450)
pO2, Arterial: 103 mmHg (ref 83.0–108.0)
pO2, Arterial: 361 mmHg — ABNORMAL HIGH (ref 83.0–108.0)
pO2, Arterial: 254 mmHg — ABNORMAL HIGH (ref 83.0–108.0)

## 2021-05-05 LAB — URINALYSIS, ROUTINE W REFLEX MICROSCOPIC
Bilirubin Urine: NEGATIVE
Glucose, UA: NEGATIVE mg/dL
Ketones, ur: NEGATIVE mg/dL
Leukocytes,Ua: NEGATIVE
Nitrite: NEGATIVE
Protein, ur: NEGATIVE mg/dL
Specific Gravity, Urine: 1.012 (ref 1.005–1.030)
pH: 5 (ref 5.0–8.0)

## 2021-05-05 LAB — CBC WITH DIFFERENTIAL/PLATELET
Abs Immature Granulocytes: 0 10*3/uL (ref 0.00–0.07)
Basophils Absolute: 0 10*3/uL (ref 0.0–0.1)
Basophils Relative: 0 %
Eosinophils Absolute: 0 10*3/uL (ref 0.0–0.5)
Eosinophils Relative: 0 %
HCT: 31.4 % — ABNORMAL LOW (ref 36.0–46.0)
Hemoglobin: 9.5 g/dL — ABNORMAL LOW (ref 12.0–15.0)
Lymphocytes Relative: 10 %
Lymphs Abs: 3.3 10*3/uL (ref 0.7–4.0)
MCH: 30.8 pg (ref 26.0–34.0)
MCHC: 30.3 g/dL (ref 30.0–36.0)
MCV: 101.9 fL — ABNORMAL HIGH (ref 80.0–100.0)
Monocytes Absolute: 2.6 10*3/uL — ABNORMAL HIGH (ref 0.1–1.0)
Monocytes Relative: 8 %
Neutro Abs: 26.8 10*3/uL — ABNORMAL HIGH (ref 1.7–7.7)
Neutrophils Relative %: 82 %
Platelets: 232 10*3/uL (ref 150–400)
RBC: 3.08 MIL/uL — ABNORMAL LOW (ref 3.87–5.11)
RDW: 14.6 % (ref 11.5–15.5)
WBC: 32.7 10*3/uL — ABNORMAL HIGH (ref 4.0–10.5)
nRBC: 0.5 % — ABNORMAL HIGH (ref 0.0–0.2)
nRBC: 1 /100 WBC — ABNORMAL HIGH

## 2021-05-05 LAB — COMPREHENSIVE METABOLIC PANEL
ALT: 210 U/L — ABNORMAL HIGH (ref 0–44)
AST: 108 U/L — ABNORMAL HIGH (ref 15–41)
Albumin: 1.6 g/dL — ABNORMAL LOW (ref 3.5–5.0)
Alkaline Phosphatase: 133 U/L — ABNORMAL HIGH (ref 38–126)
BUN: 31 mg/dL — ABNORMAL HIGH (ref 8–23)
CO2: 19 mmol/L — ABNORMAL LOW (ref 22–32)
Calcium: 8.2 mg/dL — ABNORMAL LOW (ref 8.9–10.3)
Chloride: >130 mmol/L (ref 98–111)
Creatinine, Ser: 0.66 mg/dL (ref 0.44–1.00)
GFR, Estimated: >60 mL/min (ref >60)
Glucose, Bld: 179 mg/dL — ABNORMAL HIGH (ref 70–99)
Potassium: 4 mmol/L (ref 3.5–5.1)
Sodium: 161 mmol/L (ref 135–145)
Total Bilirubin: 0.5 mg/dL (ref 0.3–1.2)
Total Protein: 4.2 g/dL — ABNORMAL LOW (ref 6.5–8.1)

## 2021-05-05 LAB — SODIUM
Sodium: 161 mmol/L (ref 135–145)
Sodium: 164 mmol/L (ref 135–145)

## 2021-05-05 LAB — GLUCOSE, CAPILLARY
Glucose-Capillary: 136 mg/dL — ABNORMAL HIGH (ref 70–99)
Glucose-Capillary: 147 mg/dL — ABNORMAL HIGH (ref 70–99)
Glucose-Capillary: 148 mg/dL — ABNORMAL HIGH (ref 70–99)
Glucose-Capillary: 200 mg/dL — ABNORMAL HIGH (ref 70–99)
Glucose-Capillary: 216 mg/dL — ABNORMAL HIGH (ref 70–99)
Glucose-Capillary: 302 mg/dL — ABNORMAL HIGH (ref 70–99)
Glucose-Capillary: 327 mg/dL — ABNORMAL HIGH (ref 70–99)

## 2021-05-05 LAB — PHOSPHORUS: Phosphorus: 3.7 mg/dL (ref 2.5–4.6)

## 2021-05-05 LAB — LIPASE, BLOOD: Lipase: 35 U/L (ref 11–51)

## 2021-05-05 LAB — AMYLASE: Amylase: 115 U/L — ABNORMAL HIGH (ref 28–100)

## 2021-05-05 LAB — LACTIC ACID, PLASMA: Lactic Acid, Venous: 1.7 mmol/L (ref 0.5–1.9)

## 2021-05-05 LAB — BILIRUBIN, DIRECT: Bilirubin, Direct: 0.2 mg/dL (ref 0.0–0.2)

## 2021-05-05 LAB — FIBRINOGEN: Fibrinogen: 687 mg/dL — ABNORMAL HIGH (ref 210–475)

## 2021-05-05 LAB — PROTIME-INR
INR: 1.2 (ref 0.8–1.2)
Prothrombin Time: 15.4 seconds — ABNORMAL HIGH (ref 11.4–15.2)

## 2021-05-05 LAB — APTT: aPTT: 76 seconds — ABNORMAL HIGH (ref 24–36)

## 2021-05-05 LAB — MAGNESIUM: Magnesium: 2.6 mg/dL — ABNORMAL HIGH (ref 1.7–2.4)

## 2021-05-05 LAB — PATHOLOGIST SMEAR REVIEW

## 2021-05-05 IMAGING — DX DG CHEST 1V PORT
1 series · 1 of 1 positions shown · non-contrast
Comparison: [DATE]

CLINICAL DATA: To determine organ function for transplant

EXAM:
PORTABLE CHEST 1 VIEW

[chest]
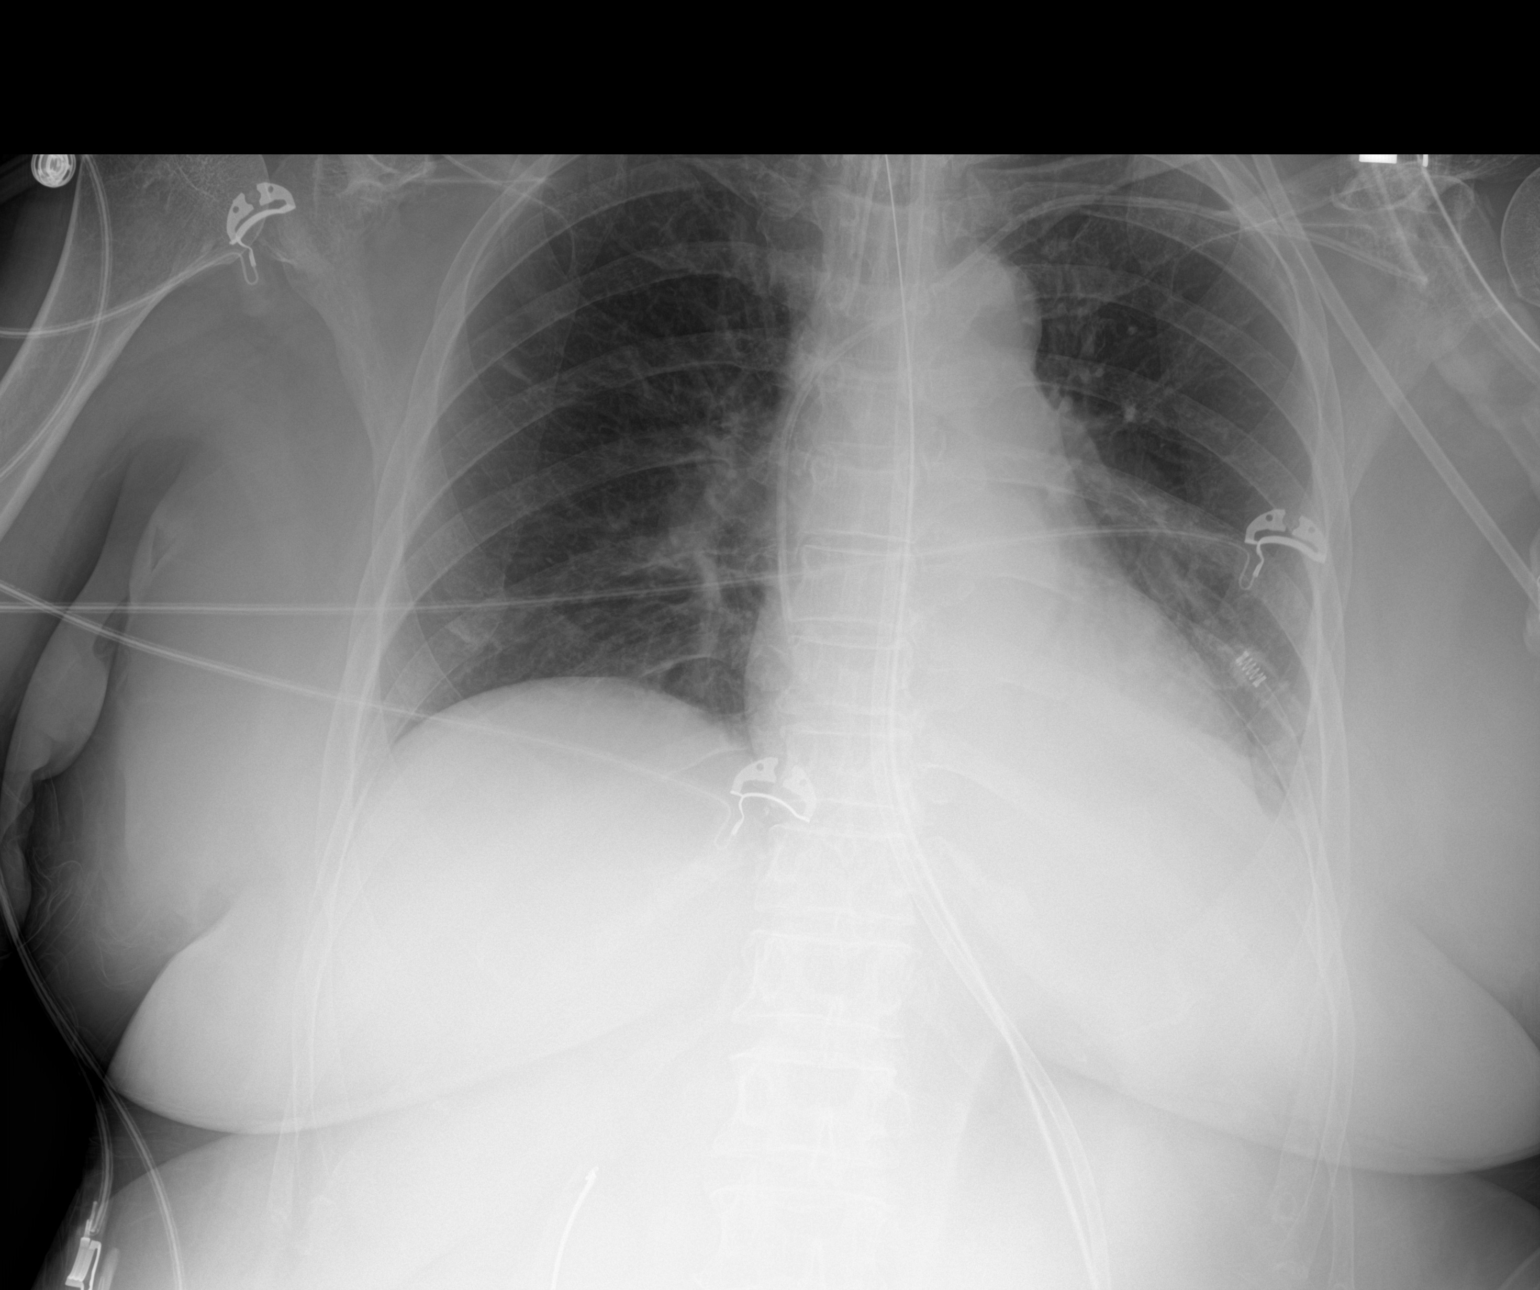

[1 of 1 positions shown; findings below may reference images not displayed]

FINDINGS: Endotracheal tube, feeding tube and left central line remain in
place, unchanged. Interval placement of NG tube with the tip in the
distal stomach. Consolidation in the left lower lobe with small left
effusion. Right lung clear. Heart is normal size.
IMPRESSION: Left lower lobe airspace opacity concerning for pneumonia. Small
left effusion.

## 2021-05-05 IMAGING — NM NM BRAIN 4+V W/ FLOW
7 series · 12 of 12 positions shown · non-contrast
Comparison: None a

CLINICAL DATA: Patient status post retrosigmoid resection of
suspected petroclival meningioma. Blood product in the fourth canal
with supratentorial ventriculomegaly. Pupils fixed and dilated. No
corneal or gag reflex. No response to cold calorics Assess for
cerebral fusion.

EXAM:
NM BRAIN SCAN WITH FLOW - 4+ VIEW
TECHNIQUE: Radionuclide angiogram and static images of the brain were obtained
after intravenous injection of radiopharmaceutical.
RADIOPHARMACEUTICALS:  20.1  Millicuries technetium 99 Ceretec

[br brain · 2.26mm/px · 1 of 1 slices shown (1 of 7)]
[im 1/1]
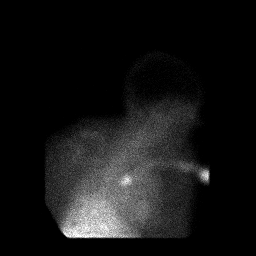

[br brain · 2.26mm/px · 1 of 1 slices shown (2 of 7)]
[im 1/1]
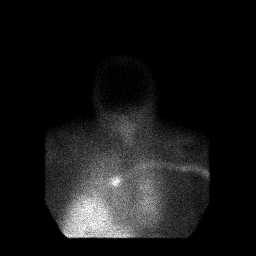

[br brain · 2.26mm/px · 1 of 1 slices shown (3 of 7)]
[im 1/1]
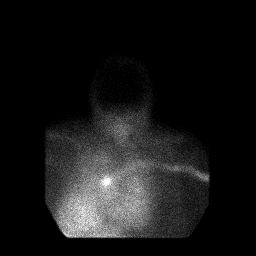

[br brain · 2.26mm/px · 1 of 1 slices shown (4 of 7)]
[im 1/1]
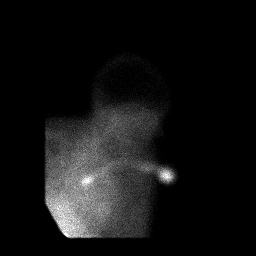

[br brain · 2.26mm/px · 1 of 1 slices shown (5 of 7)]
[im 1/1]
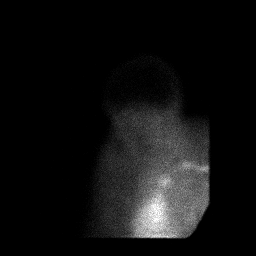

[br brain · 4.52mm/px · 6 of 30 frames shown (6 of 7)]
[frame 3/30]
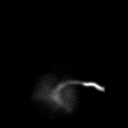
[frame 8/30]
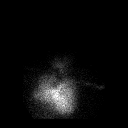
[frame 13/30]
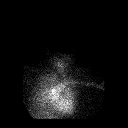
[frame 18/30]
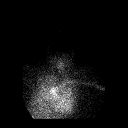
[frame 23/30]
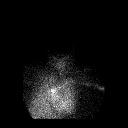
[frame 28/30]
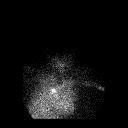

[br brain · 2.26mm/px · 1 of 1 slices shown (7 of 7)]
[im 1/1]
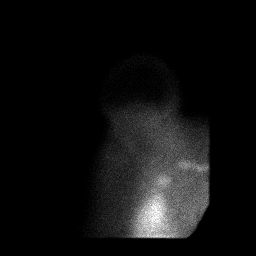

[12 of 12 positions shown; findings below may reference images not displayed]

FINDINGS: Initial vascular phase imaging demonstrates adequate radiotracer
bolus with flow demonstrated within the extracranial carotid
arteries.

Delayed static imaging demonstrates NO radiotracer activity within
LEFT or RIGHT cerebral hemisphere. Findings consistent with absence
of cerebral blood flow.
IMPRESSION: Absence of intracranial blood flow by nuclear medicine brain
perfusion imaging.

Findings conveyed PUCKETT on [DATE]  at[DATE].

## 2021-05-05 IMAGING — CT CT CHEST W/O CM
2 of 4 series · 15 of 36 positions shown, 18 images · non-contrast
Comparison: Chest x-ray [DATE]

CLINICAL DATA: Respiratory failure.  Pre donor evaluation.

EXAM:
CT CHEST WITHOUT CONTRAST
TECHNIQUE: Multidetector CT imaging of the chest was performed following the
standard protocol without IV contrast.

[Series 3: chest wo · axial · 0.66mm/px · z∈[+1280,+1534]mm · 12 of 149 slices shown, 15 images]
[im 11/149  mediastinal]
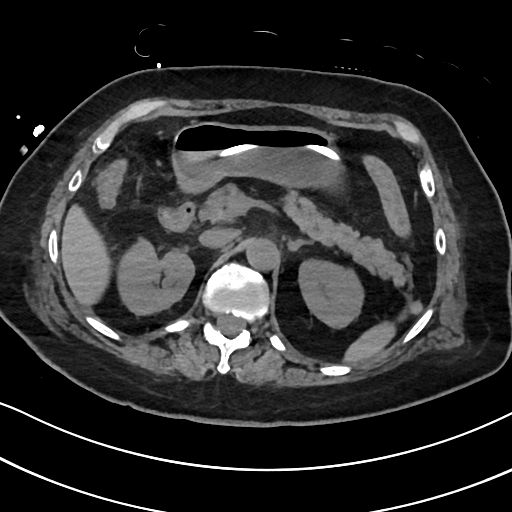
[im 11/149  lung]
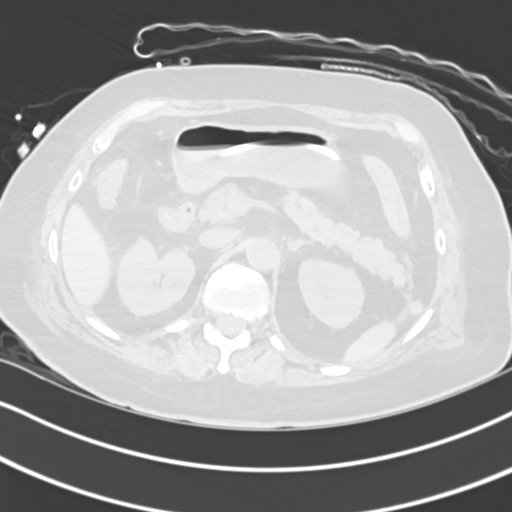
[im 22/149  lung]
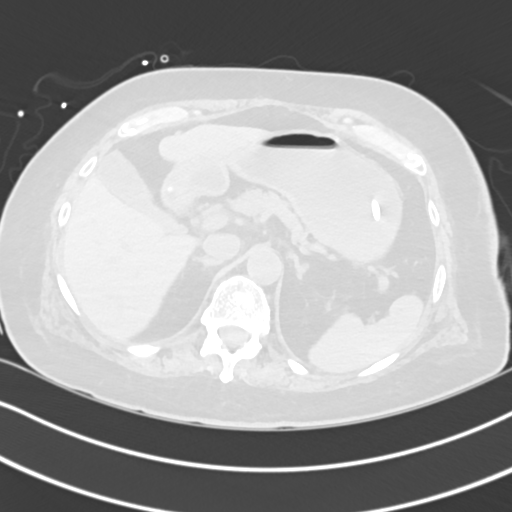
[im 32/149  lung]
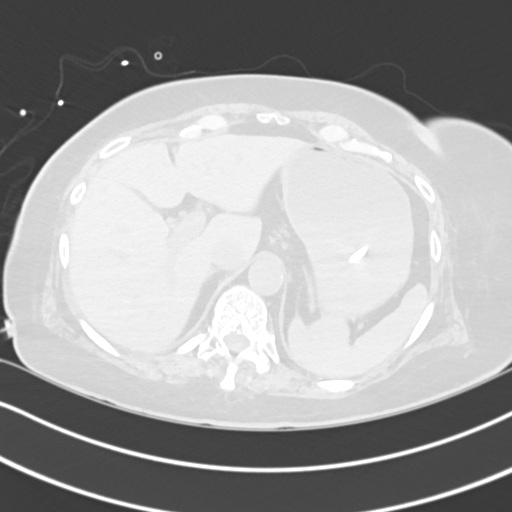
[im 43/149  lung]
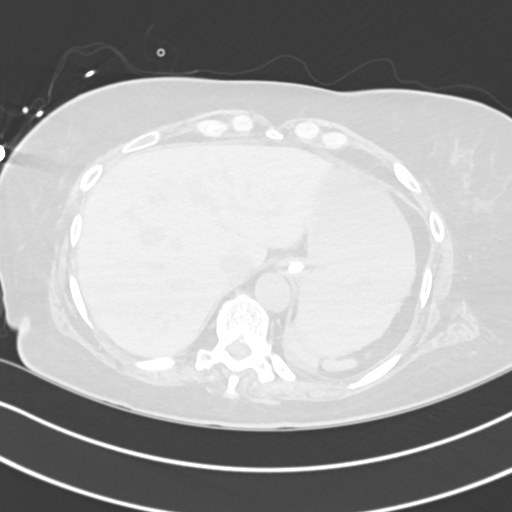
[im 53/149  mediastinal]
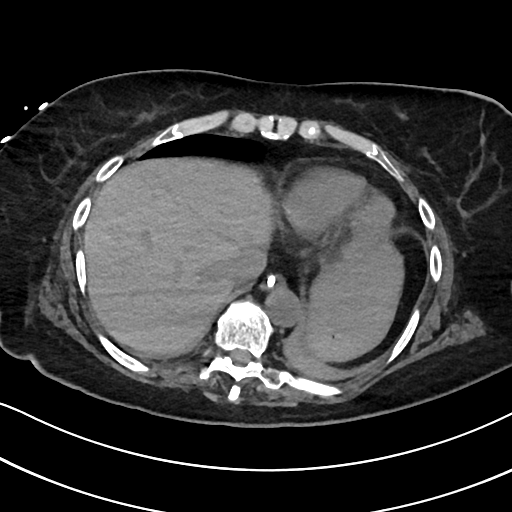
[im 53/149  lung]
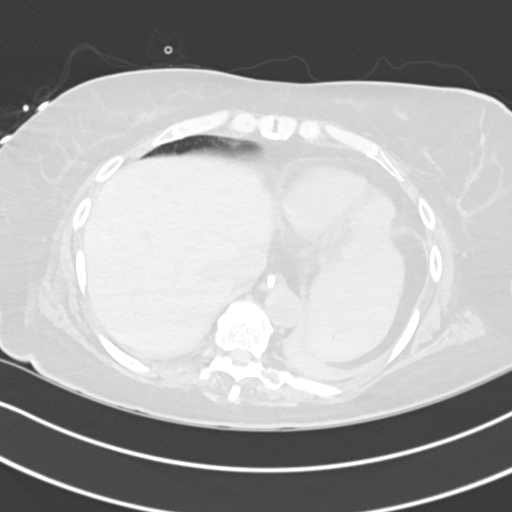
[im 64/149  lung]
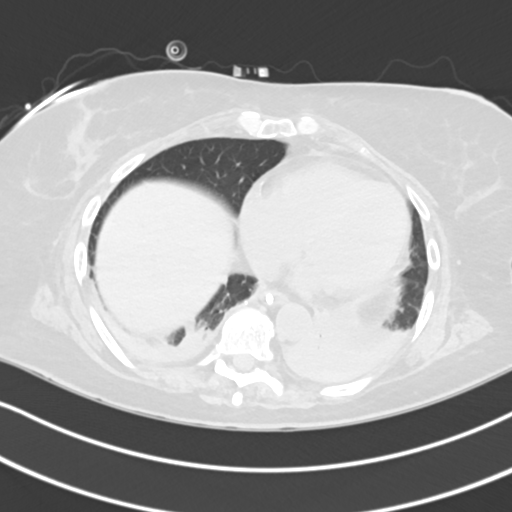
[im 85/149  lung]
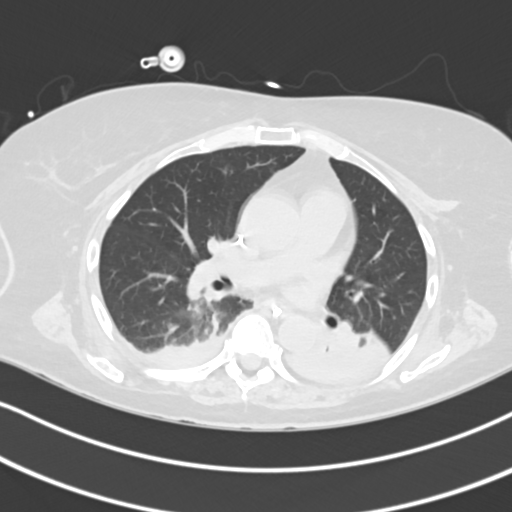
[im 96/149  lung]
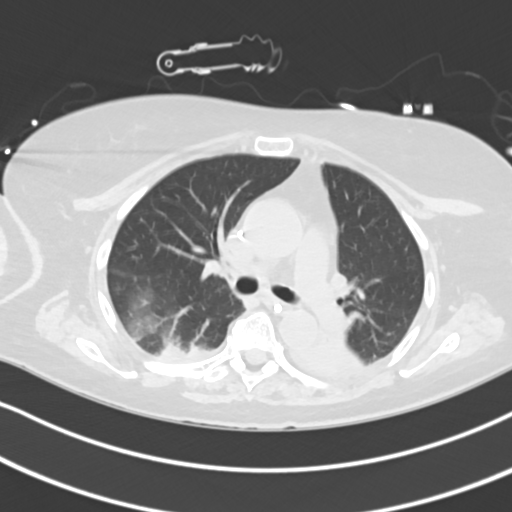
[im 106/149  mediastinal]
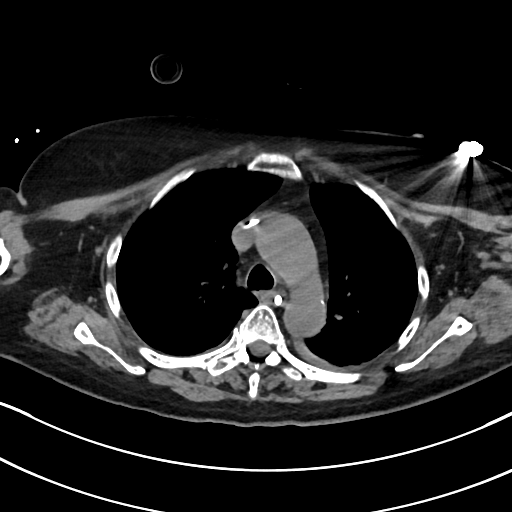
[im 106/149  lung]
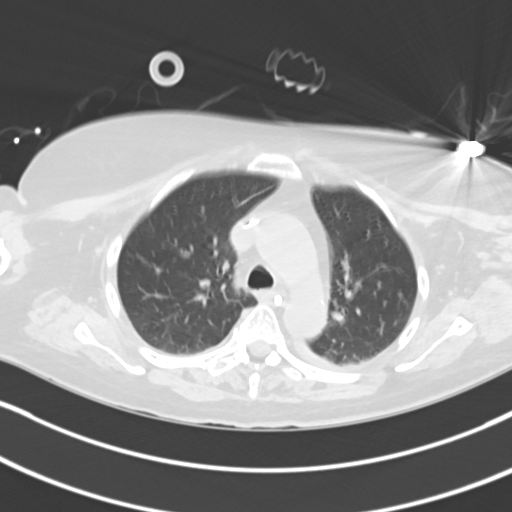
[im 117/149  lung]
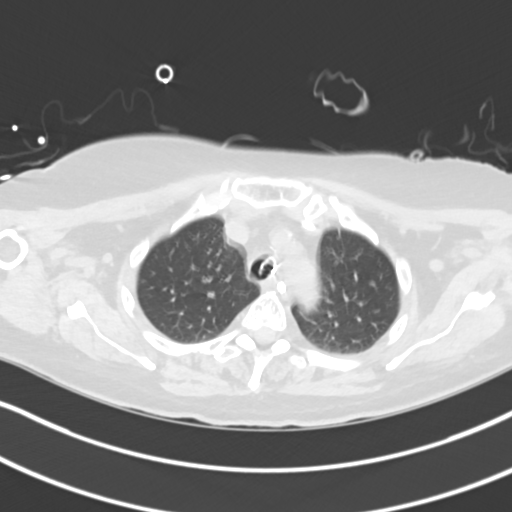
[im 127/149  lung]
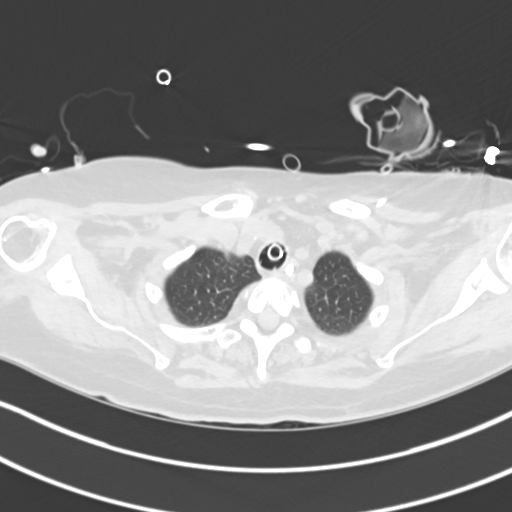
[im 138/149  lung]
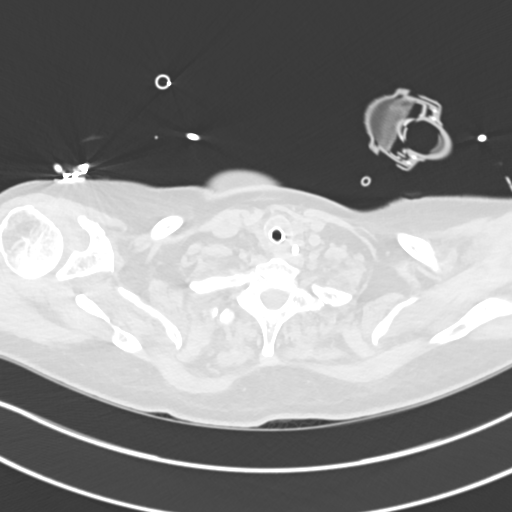

[Series 6: cor · coronal · 0.61mm/px · 3 of 118 slices shown]
[im 24/118  lung]
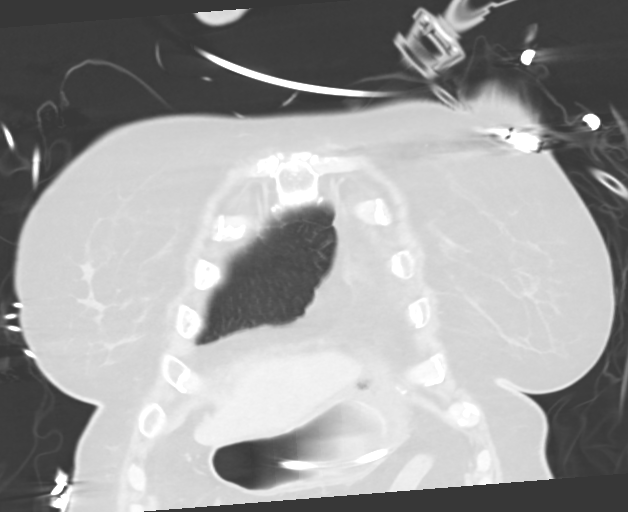
[im 47/118  lung]
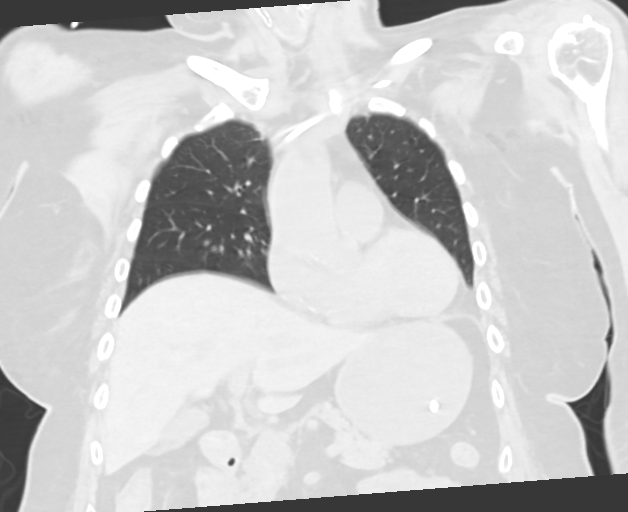
[im 71/118  lung]
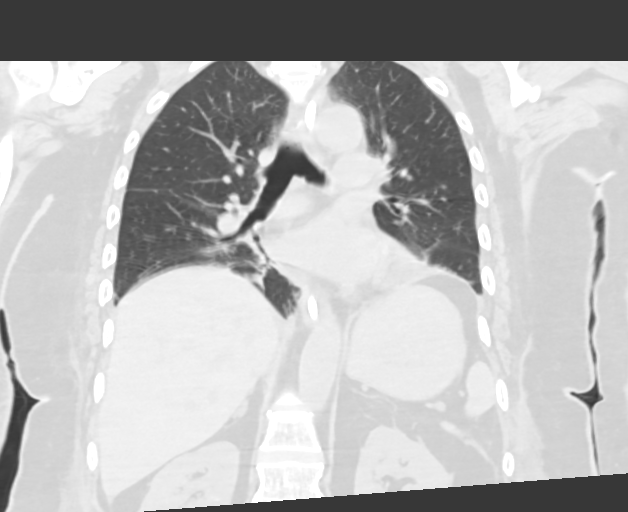

[15 of 36 positions shown; findings below may reference images not displayed]

FINDINGS: Cardiovascular: Heart size normal. Coronary artery calcifications
present. And left subclavian catheter is in place. Aortic
atherosclerotic calcifications are present.

Mediastinum/Nodes: No significant mediastinal axillary, or hilar
adenopathy is present. Esophagus is within. Thoracic inlet is
normal.

Lungs/Pleura: No tracheal tube is in place. Left greater than right
lower lobe consolidation is present. Airways are patent. Mass lesion
is present. No significant effusions or pneumothorax are present.

Upper Abdomen: NG tube terminates in the distal stomach. Abdominal
structures are otherwise normal.

Musculoskeletal: Vertebral body heights and alignment normal. No
focal lytic or blastic lesions are present.
IMPRESSION: 1. Left greater than right lower lobe consolidation concerning for
pneumonia.
2. Coronary artery disease.
3. Lines and tubes as described.
4. Aortic Atherosclerosis ([2G]-[2G]).

## 2021-05-05 MED ORDER — INSULIN ASPART 100 UNIT/ML IJ SOLN: 2.0000 [IU] | INTRAMUSCULAR | Status: DC

## 2021-05-05 MED ORDER — METHYLPREDNISOLONE SODIUM SUCC 1000 MG IJ SOLR
2000.0000 mg | Freq: Once | INTRAMUSCULAR | Status: AC
  Administered 2021-05-05: 2000 mg via INTRAVENOUS
  Filled 2021-05-05: qty 16

## 2021-05-05 MED ORDER — LEVOTHYROXINE SODIUM 100 MCG/5ML IV SOLN
20.0000 ug | Freq: Once | INTRAVENOUS | Status: AC
  Administered 2021-05-05: 20 ug via INTRAVENOUS
  Filled 2021-05-05: qty 5

## 2021-05-05 MED ORDER — NOREPINEPHRINE 4 MG/250ML-% IV SOLN
0.0000 ug/min | INTRAVENOUS | Status: DC
  Administered 2021-05-05 (×2): 20 ug/min via INTRAVENOUS
  Administered 2021-05-06: 16 ug/min via INTRAVENOUS
  Administered 2021-05-06: 14 ug/min via INTRAVENOUS
  Administered 2021-05-07: 11 ug/min via INTRAVENOUS
  Filled 2021-05-05 (×7): qty 250

## 2021-05-05 MED ORDER — SODIUM CHLORIDE 0.9 % IV BOLUS
1000.0000 mL | Freq: Once | INTRAVENOUS | Status: AC
  Administered 2021-05-05: 1000 mL via INTRAVENOUS

## 2021-05-05 MED ORDER — DEXTROSE-NACL 5-0.45 % IV SOLN: INTRAVENOUS | Status: DC

## 2021-05-05 MED ORDER — DEXTROSE-NACL 5-0.2 % IV SOLN: INTRAVENOUS | Status: DC

## 2021-05-05 MED ORDER — CHLORHEXIDINE GLUCONATE CLOTH 2 % EX PADS
6.0000 | MEDICATED_PAD | Freq: Every day | CUTANEOUS | Status: DC
  Administered 2021-05-06: 6 via TOPICAL

## 2021-05-05 MED ORDER — INSULIN ASPART 100 UNIT/ML IJ SOLN
10.0000 [IU] | Freq: Once | INTRAMUSCULAR | Status: AC
  Administered 2021-05-05: 10 [IU] via INTRAVENOUS

## 2021-05-05 MED ORDER — SODIUM BICARBONATE 8.4 % IV SOLN
100.0000 meq | Freq: Once | INTRAVENOUS | Status: AC
  Administered 2021-05-05: 100 meq via INTRAVENOUS
  Filled 2021-05-05: qty 100

## 2021-05-05 MED ORDER — INSULIN ASPART 100 UNIT/ML IJ SOLN: 5.0000 [IU] | Freq: Once | INTRAMUSCULAR | Status: DC

## 2021-05-05 MED ORDER — INSULIN ASPART 100 UNIT/ML IJ SOLN
20.0000 [IU] | Freq: Once | INTRAMUSCULAR | Status: AC
Start: 2021-05-05 — End: 2021-05-05
  Administered 2021-05-05: 20 [IU] via SUBCUTANEOUS

## 2021-05-05 MED ORDER — INSULIN ASPART 100 UNIT/ML IJ SOLN
3.0000 [IU] | INTRAMUSCULAR | Status: DC
  Administered 2021-05-06: 9 [IU] via SUBCUTANEOUS

## 2021-05-05 MED ORDER — VANCOMYCIN HCL 1500 MG/300ML IV SOLN
1500.0000 mg | INTRAVENOUS | Status: AC
  Administered 2021-05-05: 1500 mg via INTRAVENOUS
  Filled 2021-05-05: qty 300

## 2021-05-05 MED ORDER — VASOPRESSIN 20 UNITS/100 ML INFUSION FOR ORGAN DONOR
0.4000 [IU]/h | INTRAVENOUS | Status: DC
  Administered 2021-05-05: 0.4 [IU]/h via INTRAVENOUS
  Filled 2021-05-05: qty 100

## 2021-05-05 MED ORDER — DEXTROSE 50 % IV SOLN
50.0000 mL | Freq: Once | INTRAVENOUS | Status: AC
  Administered 2021-05-05: 50 mL via INTRAVENOUS
  Filled 2021-05-05: qty 50

## 2021-05-05 MED ORDER — TECHNETIUM TC 99M EXAMETAZIME IV KIT
20.1000 | PACK | Freq: Once | INTRAVENOUS | Status: AC | PRN
  Administered 2021-05-05: 20.1 via INTRAVENOUS

## 2021-05-05 MED ORDER — VASOPRESSIN 20 UNITS/100 ML INFUSION FOR SHOCK
0.0000 [IU]/min | INTRAVENOUS | Status: DC
  Administered 2021-05-05: 0.03 [IU]/min via INTRAVENOUS
  Filled 2021-05-05 (×3): qty 100

## 2021-05-05 MED ORDER — SODIUM CHLORIDE 0.9 % IV SOLN
2.0000 g | Freq: Three times a day (TID) | INTRAVENOUS | Status: DC
  Administered 2021-05-05 – 2021-05-06 (×4): 2 g via INTRAVENOUS
  Filled 2021-05-05 (×4): qty 2

## 2021-05-05 MED ORDER — SODIUM CHLORIDE 0.9 % IV SOLN
10.0000 ug/h | INTRAVENOUS | Status: DC
  Administered 2021-05-05 – 2021-05-07 (×3): 20 ug/h via INTRAVENOUS
  Filled 2021-05-05 (×7): qty 10

## 2021-05-05 NOTE — Progress Notes (Signed)
Recruitment maneuver done per donor services PS 0 PEEP 20 for 30 seconds per order. No follow up ABG per donor services. Recruitment maneuvers and follow up ABGS will continue Q4.

## 2021-05-05 NOTE — Progress Notes (Signed)
OT Cancellation Note/DC  Patient Details Name: Tammy Hernandez MRN: [id number] DOB: August 05, 1956   Cancelled Treatment:    Reason Eval/Treat Not Completed: Other (comment) (Pt transitioning to comfort care. Will sign off)  Ramond Dial, OT/L   Acute OT Clinical Specialist [place] Pager [phone] Office [phone]  05/24/2021, 6:56 AM

## 2021-05-05 NOTE — Progress Notes (Signed)
[facility] Progress Note Patient Name: Tammy Hernandez DOB: 04-20-1956 MRN: [id number]   Date of Service  05/10/2021  HPI/Events of Note  Patient with hyperglycemia.  eICU Interventions  Hyperglycemia protocol with SQ Humulin ordered.        Kerry Kass Carisma Troupe 05/10/2021, 9:34 PM

## 2021-05-05 NOTE — Progress Notes (Signed)
RT transported patient from 4N17 to nuclear medicine and from nuclear medicine to CT and back to 4N17 with RN. No complications and vitals stable. RT will continue to monitor.

## 2021-05-05 NOTE — Progress Notes (Signed)
This RN notified by lab that patient had a critical Na lab value of 161 at 2333. Previous Na value was 160 at 1718, and 158 at 1136. Patient has already been taken off hypertonic 3% fluids. Patient will transition to comfort care this morning. No interventions needed at this time. Will continue to monitor.  Wyn Quaker, RN  05/14/2021 [phone]

## 2021-05-05 NOTE — Progress Notes (Signed)
This RN spoke with representative from [facility] to give update on this patient. Was informed that representative from [facility] to speak with the patient's husband between 0700-0800 05/19/2021 about potential for organ donation before withdrawal of care.   Wyn Quaker, RN

## 2021-05-05 NOTE — Progress Notes (Signed)
Recruitment maneuver performed PS-0 PEEP-25 for 30 seconds per doner services. ABG will be assessed 30 min after on 100% fio2

## 2021-05-05 NOTE — Progress Notes (Signed)
Family at bedside this morning.  Offered my condolences and support.  They did not have any questions at this time.  Patient is undergoing organ donation later today.   Elwin Sleight, DO Neurosurgeon

## 2021-05-05 NOTE — Procedures (Signed)
Bronchoscopy Procedure Note  Tammy Hernandez  [id number]  1956-02-09  Date:06/02/2021  Time:2:02 PM   Provider Performing:Jeyden Coffelt   Procedure(s):  Flexible bronchoscopy with bronchial alveolar lavage (65784)  Indication(s) Pre-lung donor evaluation.   Consent Risks of the procedure as well as the alternatives and risks of each were explained to the patient and/or caregiver.  Consent for the procedure was obtained and is signed in the bedside chart  Anesthesia None   Time Out Verified patient identification, verified procedure, site/side was marked, verified correct patient position, special equipment/implants available, medications/allergies/relevant history reviewed, required imaging and test results available.   Sterile Technique Usual hand hygiene, masks, gowns, and gloves were used   Procedure Description Bronchoscope advanced through endotracheal tube and into airway.  Airways were examined down to subsegmental level with findings noted below.   Following diagnostic evaluation, BAL(s) performed in 44m with normal saline and return of 249mfluid  Findings: all segments well visualized. No endobronchial lesions, no extrinsic compression. Normal mucosa. Minimal white secretions.   Complications/Tolerance None; patient tolerated the procedure well. Chest X-ray is not needed post procedure.   EBL none   Specimen(s) RML, LLL sent for Gram stain and culture.

## 2021-05-05 NOTE — Significant Event (Signed)
Adult Brain Death Determination  Time of Examination: 05/27/2021 4:34 PM  A. No Evidence of /Cause of Reversible CNS Depression  1. Core temperature must be greater >36 degrees. Last temp: Temp: 97.6 F (36.4 C) (Note: If unable to achieve normothermia after 12 hours of temperature management, may consider proceeding with Brain Death Evaluation.):    yes  2. Evidence of severe metabolic perturbations that could potentate CNS depression. Consider glucose, Na, creatinine, PaCO2, SaO2.:    Present  - Marked hypernatremia.  3. Evidence of drugs, by history or measurement, that could potentiate central nervous system depression: narcotics, ethanol, benzodiazepines, barbiturates, neuromuscular blockade.:     Absent  B. Absence of Cortical Function  1. GCS = 3:    yes  C. Absence of Brain Stem Reflexes and Responses  1. Pupils light-fixed    yes  2. Corneal reflexes:    Absent  3. Response to upper and lower airway stimulation, such as pharyngeal and endotracheal suctioning.:    Absent  4. Ocular response to head turning (eye movement).    Absent  D. Absence of Spontaneous Respirations  (Apnea test performed per Brain Death Policy. If not met due to hemodynamic/ventilatory instability, then perform EEG, TCD, or cerebral blow flow studies.)  1.   Spontaneous Respirations   Absent - given hypernatremia will forgo formal apnea test and proceed to nuclear flow study.   E. Document Confirmatory Test Utilized: (Optional) Nuclear cerebral flow, cerebral angiography (CT/MR angio), transcranial Doppler ultrasound, EEG, SSEP (record results).  1. Test results (if available):  Nuclear medicine perfusion study shows absence of blood flow consistent with brain death  Patient pronounced dead by neurological criteria at 1527  on 05/25/2021.  Kipp Brood, MD 05/16/2021 4:34 PM

## 2021-05-05 NOTE — Progress Notes (Signed)
NAME:  Tammy Hernandez MRN:  [id number] DOB:  1956-10-28 LOS: 76 ADMISSION DATE:  04/06/2021 CONSULTATION DATE:  04/28/2021 REFERRING MD:  Zada Finders CHIEF COMPLAINT:  Acute respiratory failure s/p craniotomy  History of Present Illness:  65 year old female with PMHx significant for T2DM, HLD, hypothyroidism and recent diagnosis of petroclival meningioma as evidenced by ataxia, R-sided hearing loss, myelopathy and dysphagia who presented to [facility] 5/17 for R retrosigmoid craniotomy for resection of meningioma.   Patient tolerated procedure well and was extubated in the OR; unfortunately, remained too somnolent to protect her airway and was subsequently reintubated. PCCM consulted for vent management.  Significant Hospital Events: Including procedures, antibiotic start and stop dates in addition to other pertinent events   . 5/17 - POD#0 R craniotomy for petroclival meningioma resection. Extubated in OR, too somnolent to protect her airway, reintubated. Admit to Neuro ICU post-op. . 04/21/2021 interventricular drain to be placed by neurosurgery . 04/21/2021 noted MRI . 04/21/2021 arterial line discontinued . 5/21 - Intermittently following commands . 5/22 - C/f EVD clogging . 5/23 - More awake this AM, following commands in LE. Decreased EVD output. Repeat CT Head to assess ventricles. . 5/24 - CT Head with improved appearance of ventricles per NSGY. Decreased FiO2 need, stable at 40%. Still with waxing/waning mental status. . 5/25 Able to tolerate PSV, still somnolent, EVD removed . 5/26 More somnolent today, CTH without significant changes, concern for developing infection, Cefepime initiated . 5/27 continued poor neuro status, increased WBC, respiratory culture with gm neg rods and few gm positives, but MRSA neg, narrow to unasyn . 5/28 hypotension and fevers, concern for worsening septic shock. . 5/30 less responsive. Episode of hypotension and bradycardia.  Worsening cerebral edema on CT . 5/31:  Neurosurgery discussed with family to make comfort care tomorrow and changed code status to DNR  Interim History / Subjective:  Plans for DCD later today however now patient appears to be in DI, UOP 5.1L/ 24hr, Na up 164 and no longer triggering vent, suspected has transitioned to brain death  Objective:  Blood pressure 139/62, pulse 75, temperature (!) 97.4 F (36.3 C), temperature source Axillary, resp. rate 16, height 5\' 3"  (1.6 m), weight 77.2 kg, SpO2 100 %.    Vent Mode: PRVC FiO2 (%):  [30 %-40 %] 30 % Set Rate:  [16 bmp] 16 bmp Vt Set:  [420 mL] 420 mL PEEP:  [5 cmH20] 5 cmH20 Plateau Pressure:  [16 cmH20-21 cmH20] 17 cmH20   Intake/Output Summary (Last 24 hours) at 05/31/2021 1209 Last data filed at 05/28/2021 1000 Gross per 24 hour  Intake 4994.33 ml  Output 3875 ml  Net 1119.33 ml   Filed Weights   05/03/21 0500 05/04/21 0410 05/28/2021 0337  Weight: 72.9 kg 75.3 kg 77.2 kg    General:  Critically ill appearing older female unresponsive on MV HEENT/ Neuro: MM pink/moist, pupils 4/unresponsive, absent corneal, no gag/ cough, or response to noxious stimuli CV: rr, NSR  PULM:  Non labored, apenic on PSV, CTA GI: soft, bs+,  Foley  Extremities: warm/dry  Labs/imaging that I have personally reviewed: (right click and "Reselect all SmartList Selections" daily)  Na trend 160- 161- [facility] Problem List:   Hyponatremia   Assessment & Plan:   Critically ill due to increasing cerebral edema with herniation and hydrocephalus following resection of petroclival meningioma, now requiring mechanical ventilation and initiation of hypertonic saline. Acute Respiratory Failure with hypoxia s/p intubation Distributive shock secondary to above: requiring  titration of vasopressors; No clear sign of infection Type 2 diabetes AKI (improving) Elevated Liver enzymes (improving) Hx of Hypothyroidism Suspected DI  Plan: - Plans for DCD later today, however appears to have  progressed to possible brain death based on lack of brainstem reflexes however given metabolic derangements, will plan for NM cerebral blood flow study today - [facility] bridge also requesting CT chest w/o and bronch today for possible lung tx. - continue NE, added vasopressin for suspected DI, 1L NS now for losses, monitor UOP closely - BMET today 1400 -Continue Vanc and Cefepime, repeat BC 5/30 ngtd  -SSI and CBG monitoring  -continue home synthroid   Daily Goals Checklist  Pain/Anxiety/Delirium protocol (if indicated): None Neuro vitals: every 1 hours AED's: None VAP protocol (if indicated): On board. DVT prophylaxis: Heparin subcu Nutrition Status: tube feeds GI prophylaxis: Famotidine Urinary catheter: in place Central lines: Required Glucose control: SSI yes Mobility/therapy needs: Bedrest Antibiotic de-escalation: 7 days empiric antibiotics Code Status: DNR Family Communication: no family at bedside 6/1 am Disposition: ICU    Critical care time: 25 minutes        Kennieth Rad, ACNP [facility] 05/22/2021, 12:09 PM

## 2021-05-05 NOTE — Telephone Encounter (Signed)
Pt.'s daughter called in to cancel June appointment  due to pt is deceased.

## 2021-05-05 NOTE — Progress Notes (Signed)
This RN notified by lab that patient had a critical Na lab value of 164 at 0515. Na level was previously 161 at 2333. Patient planned to transition to comfort care this morning. No interventions needed at this time.   Wyn Quaker, RN 05/09/2021 [id number]

## 2021-05-05 NOTE — Progress Notes (Signed)
[facility] Progress Note Patient Name: Tammy Hernandez DOB: Mar 12, 1956 MRN: [id number]   Date of Service  06/02/2021  HPI/Events of Note  Blood sugar 302 mg / dl which is above the threshold for sliding scale SQ insulin coverage per protocol.  eICU Interventions  Patient given Humulin 5 units iv x 1 to bring blood sugar under the threshold for sliding scale SQ insulin coverage, CBG will be check hourly x 4 to monitor for hypoglycemia.        Kerry Kass [name] 05/21/2021, 10:37 PM

## 2021-05-05 DEATH — deceased

## 2021-05-06 ENCOUNTER — Other Ambulatory Visit (HOSPITAL_COMMUNITY): Payer: 59

## 2021-05-06 DIAGNOSIS — Z529 Donor of unspecified organ or tissue: Secondary | ICD-10-CM | POA: Diagnosis not present

## 2021-05-06 DIAGNOSIS — G9382 Brain death: Secondary | ICD-10-CM | POA: Diagnosis not present

## 2021-05-06 DIAGNOSIS — J9811 Atelectasis: Secondary | ICD-10-CM | POA: Diagnosis not present

## 2021-05-06 DIAGNOSIS — Z5289 Donor of other specified organs or tissues: Secondary | ICD-10-CM | POA: Diagnosis not present

## 2021-05-06 DIAGNOSIS — Z526 Liver donor: Secondary | ICD-10-CM | POA: Diagnosis not present

## 2021-05-06 LAB — POCT I-STAT 7, (LYTES, BLD GAS, ICA,H+H)
Acid-base deficit: 1 mmol/L (ref 0.0–2.0)
Acid-base deficit: 3 mmol/L — ABNORMAL HIGH (ref 0.0–2.0)
Acid-base deficit: 4 mmol/L — ABNORMAL HIGH (ref 0.0–2.0)
Acid-base deficit: 5 mmol/L — ABNORMAL HIGH (ref 0.0–2.0)
Bicarbonate: 21.3 mmol/L (ref 20.0–28.0)
Bicarbonate: 21.3 mmol/L (ref 20.0–28.0)
Bicarbonate: 22.2 mmol/L (ref 20.0–28.0)
Bicarbonate: 22.8 mmol/L (ref 20.0–28.0)
Calcium, Ion: 1.2 mmol/L (ref 1.15–1.40)
Calcium, Ion: 1.21 mmol/L (ref 1.15–1.40)
Calcium, Ion: 1.21 mmol/L (ref 1.15–1.40)
Calcium, Ion: 1.24 mmol/L (ref 1.15–1.40)
HCT: 23 % — ABNORMAL LOW (ref 36.0–46.0)
HCT: 23 % — ABNORMAL LOW (ref 36.0–46.0)
HCT: 23 % — ABNORMAL LOW (ref 36.0–46.0)
HCT: 24 % — ABNORMAL LOW (ref 36.0–46.0)
Hemoglobin: 7.8 g/dL — ABNORMAL LOW (ref 12.0–15.0)
Hemoglobin: 7.8 g/dL — ABNORMAL LOW (ref 12.0–15.0)
Hemoglobin: 7.8 g/dL — ABNORMAL LOW (ref 12.0–15.0)
Hemoglobin: 8.2 g/dL — ABNORMAL LOW (ref 12.0–15.0)
O2 Saturation: 100 %
O2 Saturation: 100 %
O2 Saturation: 100 %
O2 Saturation: 100 %
Patient temperature: 94.2
Patient temperature: 97.5
Patient temperature: 99.6
Patient temperature: 99.6
Potassium: 3.9 mmol/L (ref 3.5–5.1)
Potassium: 3.9 mmol/L (ref 3.5–5.1)
Potassium: 4.1 mmol/L (ref 3.5–5.1)
Potassium: 4.5 mmol/L (ref 3.5–5.1)
Sodium: 157 mmol/L — ABNORMAL HIGH (ref 135–145)
Sodium: 158 mmol/L — ABNORMAL HIGH (ref 135–145)
Sodium: 158 mmol/L — ABNORMAL HIGH (ref 135–145)
Sodium: 162 mmol/L (ref 135–145)
TCO2: 22 mmol/L (ref 22–32)
TCO2: 23 mmol/L (ref 22–32)
TCO2: 23 mmol/L (ref 22–32)
TCO2: 24 mmol/L (ref 22–32)
pCO2 arterial: 32.6 mmHg (ref 32.0–48.0)
pCO2 arterial: 33.9 mmHg (ref 32.0–48.0)
pCO2 arterial: 39.9 mmHg (ref 32.0–48.0)
pCO2 arterial: 45 mmHg (ref 32.0–48.0)
pH, Arterial: 7.285 — ABNORMAL LOW (ref 7.350–7.450)
pH, Arterial: 7.356 (ref 7.350–7.450)
pH, Arterial: 7.394 (ref 7.350–7.450)
pH, Arterial: 7.45 (ref 7.350–7.450)
pO2, Arterial: 230 mmHg — ABNORMAL HIGH (ref 83.0–108.0)
pO2, Arterial: 257 mmHg — ABNORMAL HIGH (ref 83.0–108.0)
pO2, Arterial: 268 mmHg — ABNORMAL HIGH (ref 83.0–108.0)
pO2, Arterial: 272 mmHg — ABNORMAL HIGH (ref 83.0–108.0)

## 2021-05-06 LAB — GLUCOSE, CAPILLARY
Glucose-Capillary: 109 mg/dL — ABNORMAL HIGH (ref 70–99)
Glucose-Capillary: 112 mg/dL — ABNORMAL HIGH (ref 70–99)
Glucose-Capillary: 114 mg/dL — ABNORMAL HIGH (ref 70–99)
Glucose-Capillary: 114 mg/dL — ABNORMAL HIGH (ref 70–99)
Glucose-Capillary: 118 mg/dL — ABNORMAL HIGH (ref 70–99)
Glucose-Capillary: 121 mg/dL — ABNORMAL HIGH (ref 70–99)
Glucose-Capillary: 145 mg/dL — ABNORMAL HIGH (ref 70–99)
Glucose-Capillary: 151 mg/dL — ABNORMAL HIGH (ref 70–99)
Glucose-Capillary: 154 mg/dL — ABNORMAL HIGH (ref 70–99)
Glucose-Capillary: 154 mg/dL — ABNORMAL HIGH (ref 70–99)
Glucose-Capillary: 156 mg/dL — ABNORMAL HIGH (ref 70–99)
Glucose-Capillary: 163 mg/dL — ABNORMAL HIGH (ref 70–99)
Glucose-Capillary: 192 mg/dL — ABNORMAL HIGH (ref 70–99)
Glucose-Capillary: 235 mg/dL — ABNORMAL HIGH (ref 70–99)
Glucose-Capillary: 256 mg/dL — ABNORMAL HIGH (ref 70–99)
Glucose-Capillary: 262 mg/dL — ABNORMAL HIGH (ref 70–99)
Glucose-Capillary: 265 mg/dL — ABNORMAL HIGH (ref 70–99)
Glucose-Capillary: 270 mg/dL — ABNORMAL HIGH (ref 70–99)

## 2021-05-06 LAB — URINALYSIS, ROUTINE W REFLEX MICROSCOPIC
Bacteria, UA: NONE SEEN
Bilirubin Urine: NEGATIVE
Glucose, UA: NEGATIVE mg/dL
Ketones, ur: NEGATIVE mg/dL
Leukocytes,Ua: NEGATIVE
Nitrite: NEGATIVE
Protein, ur: NEGATIVE mg/dL
Specific Gravity, Urine: 1.005 (ref 1.005–1.030)
pH: 5 (ref 5.0–8.0)

## 2021-05-06 LAB — COMPREHENSIVE METABOLIC PANEL
ALT: 205 U/L — ABNORMAL HIGH (ref 0–44)
ALT: 193 U/L — ABNORMAL HIGH (ref 0–44)
AST: 103 U/L — ABNORMAL HIGH (ref 15–41)
AST: 102 U/L — ABNORMAL HIGH (ref 15–41)
Albumin: 1.6 g/dL — ABNORMAL LOW (ref 3.5–5.0)
Albumin: 1.9 g/dL — ABNORMAL LOW (ref 3.5–5.0)
Alkaline Phosphatase: 162 U/L — ABNORMAL HIGH (ref 38–126)
Alkaline Phosphatase: 133 U/L — ABNORMAL HIGH (ref 38–126)
Anion gap: 4 — ABNORMAL LOW (ref 5–15)
Anion gap: 6 (ref 5–15)
BUN: 25 mg/dL — ABNORMAL HIGH (ref 8–23)
BUN: 22 mg/dL (ref 8–23)
CO2: 22 mmol/L (ref 22–32)
CO2: 21 mmol/L — ABNORMAL LOW (ref 22–32)
Calcium: 7.9 mg/dL — ABNORMAL LOW (ref 8.9–10.3)
Calcium: 7.7 mg/dL — ABNORMAL LOW (ref 8.9–10.3)
Chloride: 130 mmol/L — ABNORMAL HIGH (ref 98–111)
Chloride: 128 mmol/L — ABNORMAL HIGH (ref 98–111)
Creatinine, Ser: 0.72 mg/dL (ref 0.44–1.00)
Creatinine, Ser: 0.71 mg/dL (ref 0.44–1.00)
GFR, Estimated: >60 mL/min (ref >60)
GFR, Estimated: >60 mL/min (ref >60)
Glucose, Bld: 310 mg/dL — ABNORMAL HIGH (ref 70–99)
Glucose, Bld: 129 mg/dL — ABNORMAL HIGH (ref 70–99)
Potassium: 4.2 mmol/L (ref 3.5–5.1)
Potassium: 3.9 mmol/L (ref 3.5–5.1)
Sodium: 156 mmol/L — ABNORMAL HIGH (ref 135–145)
Sodium: 155 mmol/L — ABNORMAL HIGH (ref 135–145)
Total Bilirubin: 0.6 mg/dL (ref 0.3–1.2)
Total Bilirubin: 0.7 mg/dL (ref 0.3–1.2)
Total Protein: 4.4 g/dL — ABNORMAL LOW (ref 6.5–8.1)
Total Protein: 4.7 g/dL — ABNORMAL LOW (ref 6.5–8.1)

## 2021-05-06 LAB — CBC
HCT: 29.8 % — ABNORMAL LOW (ref 36.0–46.0)
Hemoglobin: 8.9 g/dL — ABNORMAL LOW (ref 12.0–15.0)
MCH: 30.8 pg (ref 26.0–34.0)
MCHC: 29.9 g/dL — ABNORMAL LOW (ref 30.0–36.0)
MCV: 103.1 fL — ABNORMAL HIGH (ref 80.0–100.0)
Platelets: 192 10*3/uL (ref 150–400)
RBC: 2.89 MIL/uL — ABNORMAL LOW (ref 3.87–5.11)
RDW: 14.8 % (ref 11.5–15.5)
WBC: 31.4 10*3/uL — ABNORMAL HIGH (ref 4.0–10.5)
nRBC: 0.4 % — ABNORMAL HIGH (ref 0.0–0.2)

## 2021-05-06 LAB — BODY FLUID CULTURE W GRAM STAIN
Culture: NO GROWTH
Gram Stain: NONE SEEN

## 2021-05-06 LAB — CULTURE, BLOOD (ROUTINE X 2)
Culture: NO GROWTH
Special Requests: ADEQUATE

## 2021-05-06 LAB — SARS CORONAVIRUS 2 (TAT 6-24 HRS): SARS Coronavirus 2: NEGATIVE

## 2021-05-06 IMAGING — US US BIOPSY CORE LIVER
1 series · 11 of 11 positions shown · non-contrast
Comparison: none

INDICATION: Random liver biopsy to assess candidacy for liver donation.

[Series 1: us biopsy (liver) · 11 of 11 slices shown]
[im 1/11]
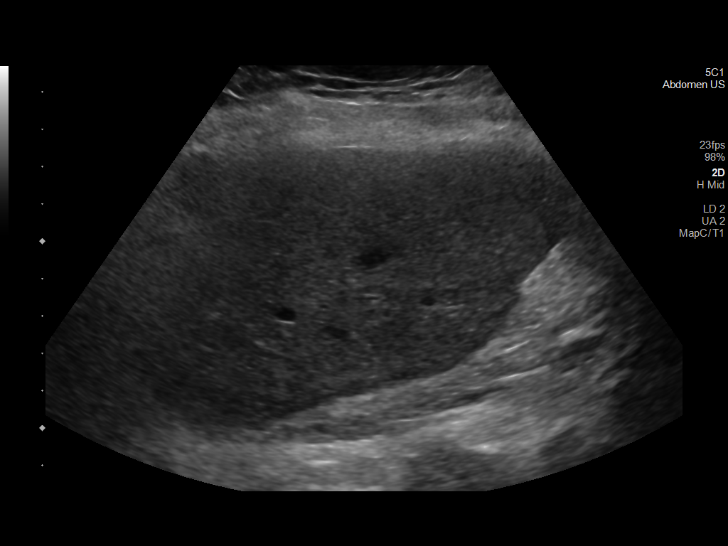
[im 2/11]
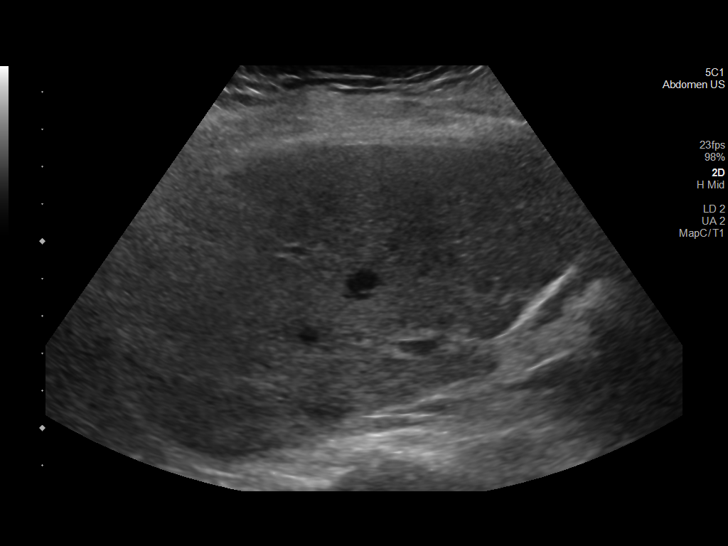
[im 3/11]
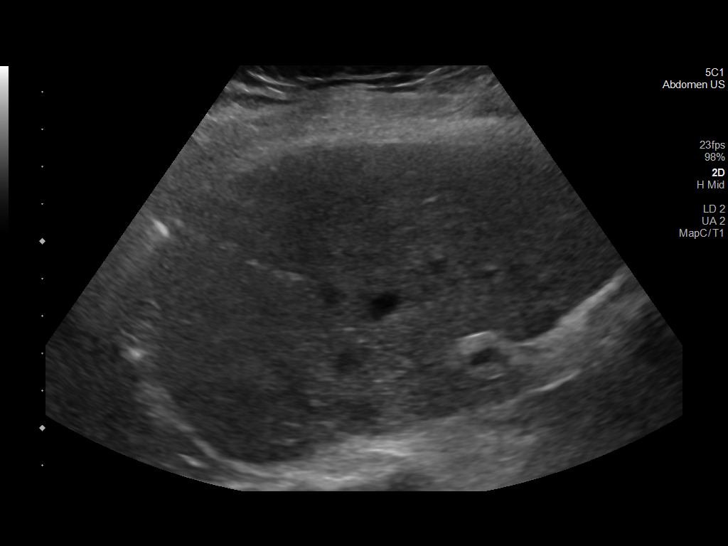
[im 4/11]
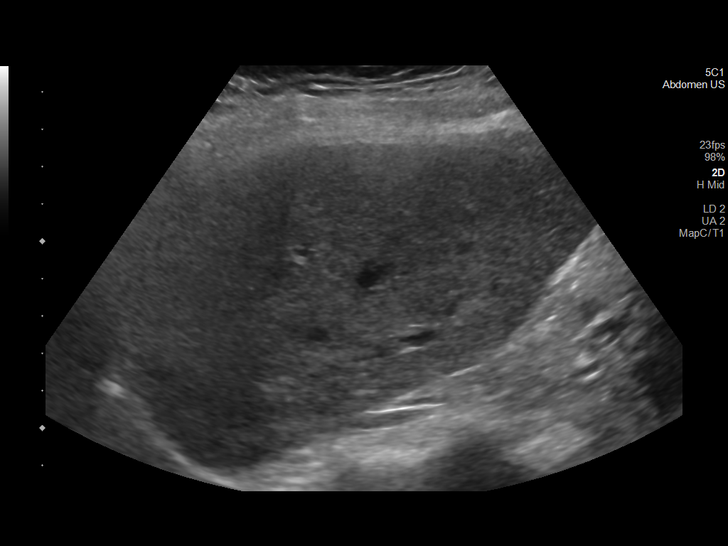
[im 5/11]
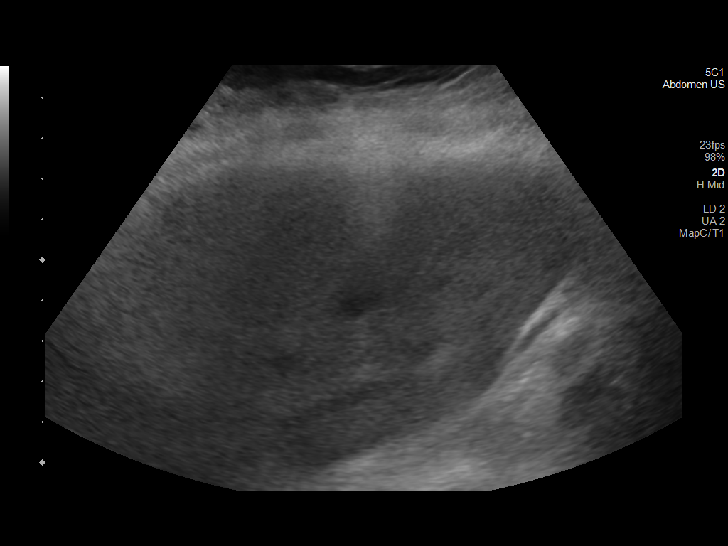
[im 6/11]
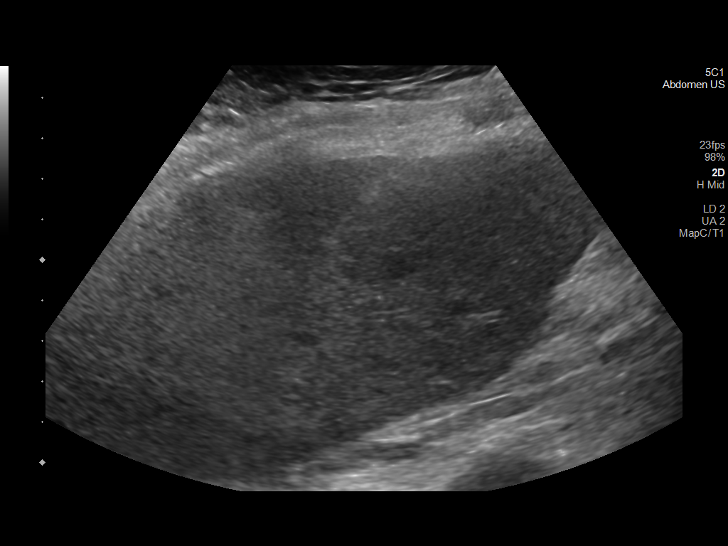
[im 7/11]
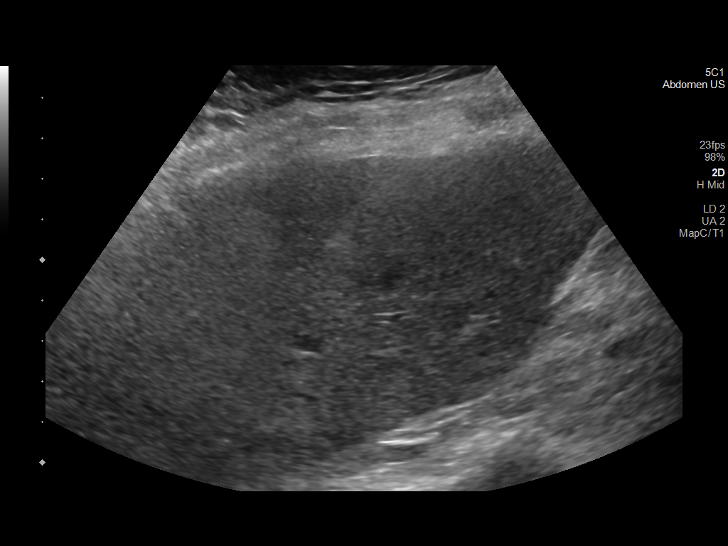
[im 8/11]
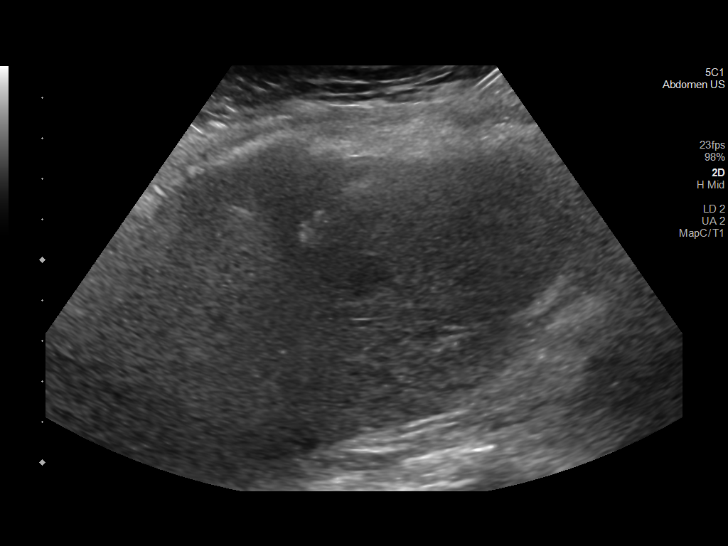
[im 9/11]
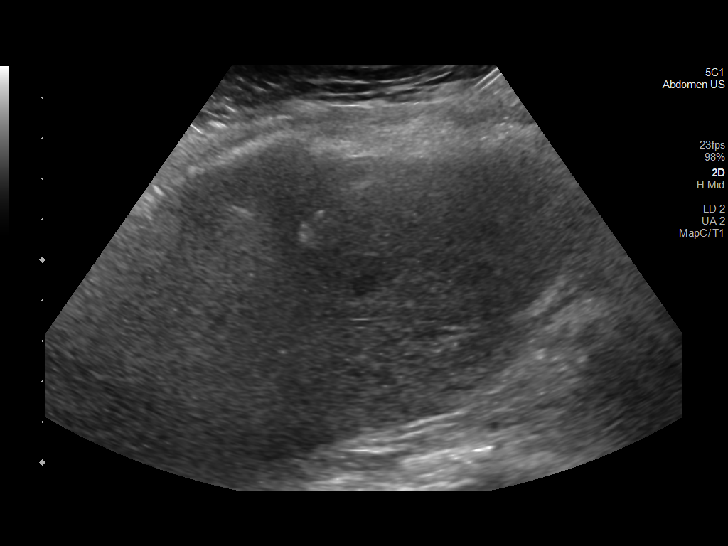
[im 10/11]
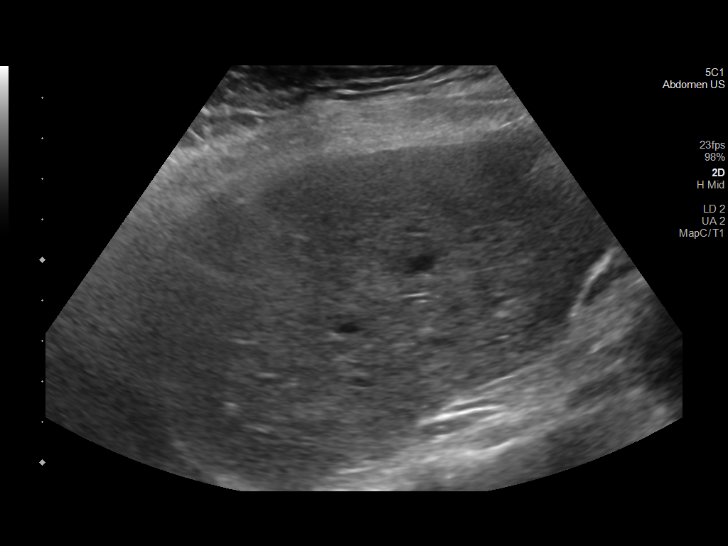
[im 11/11]
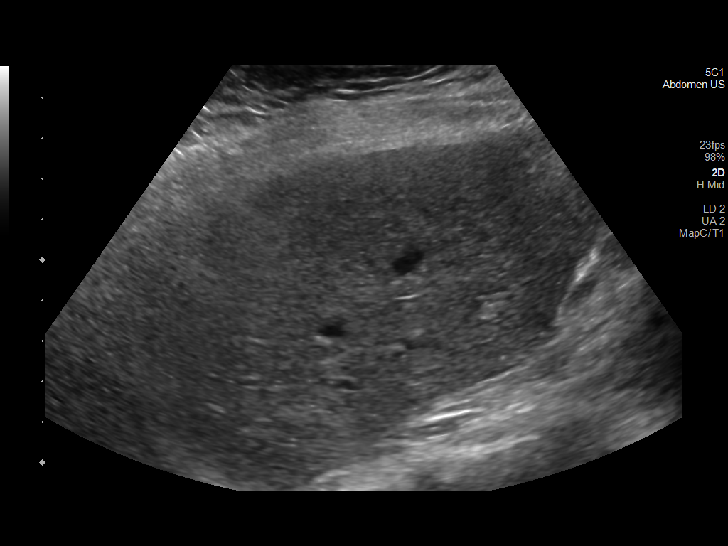

[11 of 11 positions shown; findings below may reference images not displayed]

EXAM:
Ultrasound-guided core biopsy of the liver

MEDICATIONS:
None.

ANESTHESIA/SEDATION:
None.

FLUOROSCOPY TIME:  None.

COMPLICATIONS:
None immediate.

PROCEDURE:
Informed consent was obtained from the patient following explanation
of the procedure, risks, benefits and alternatives. The patient
understands, agrees and consents for the procedure. All questions
were addressed. A time out was performed.

The right upper quadrant was interrogated with ultrasound. A
relatively avascular plane of the liver was identified. A suitable
skin entry site was selected and marked. The region was then
sterilely prepped and draped in standard fashion using chlorhexidine
skin prep. A small dermatotomy was made.

Under real-time sonographic guidance, a 17 gauge trocar needle was
advanced into the liver. Multiple 18 gauge core biopsies were then
coaxially obtained. Needle placement was confirmed on all biopsy
passes with real-time sonography. Biopsy specimens were placed in
formalin and delivered to pathology for further analysis.

Post biopsy ultrasound imaging demonstrates no active bleeding or
perihepatic hematoma. The patient tolerated the procedure well.
IMPRESSION: Technically successful ultrasound-guided random core biopsy of the
liver.

## 2021-05-06 IMAGING — DX DG CHEST 1V PORT
1 series · 1 of 1 positions shown · non-contrast
Comparison: Portable chest [DATE] and earlier.

CLINICAL DATA: 64-year-old female organ donor.

EXAM:
PORTABLE CHEST 1 VIEW

[chest]
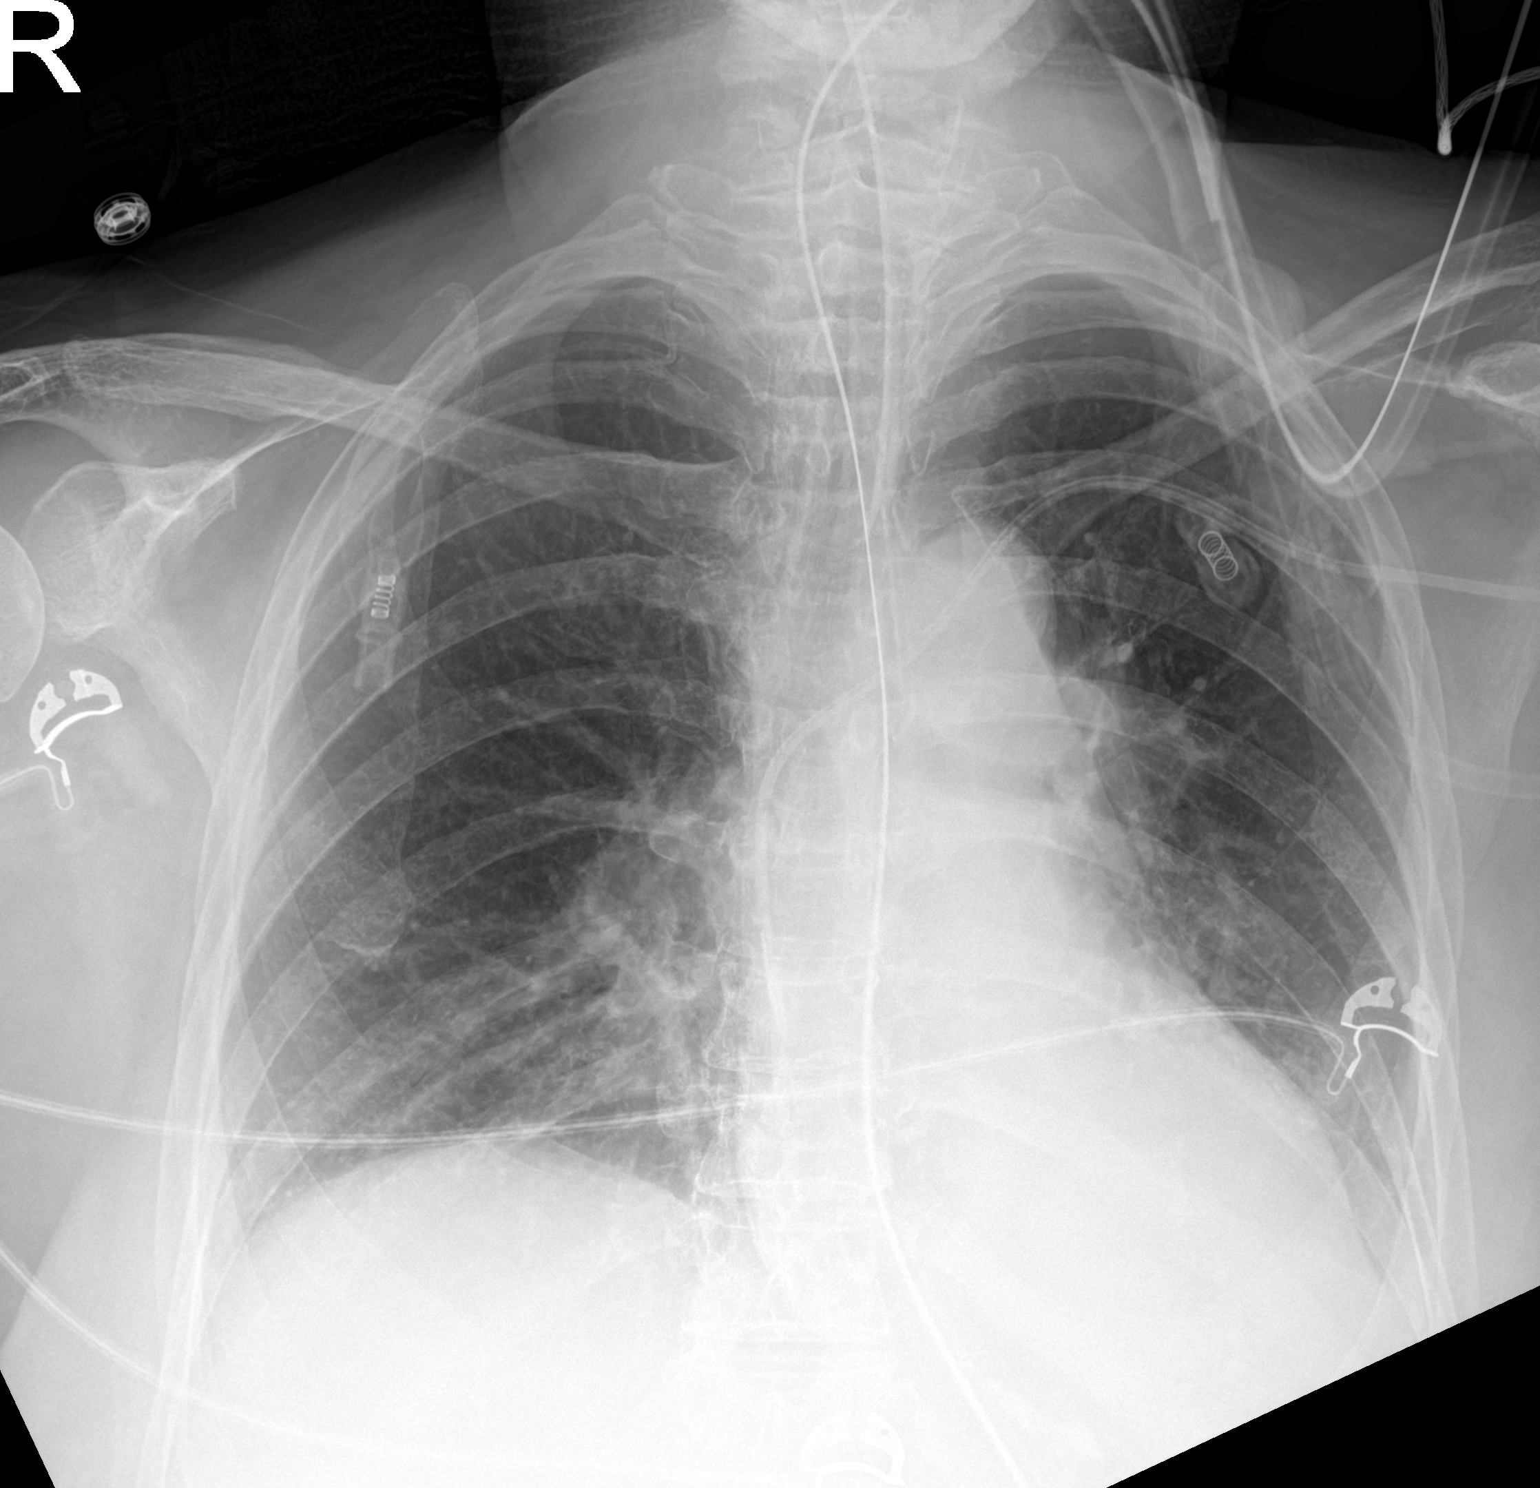

[1 of 1 positions shown; findings below may reference images not displayed]

FINDINGS: Portable AP view at [7A] hours. Stable lines and tubes. Mildly
larger lung volumes. Stable cardiac size and mediastinal contours.
Left lower lobe atelectasis or consolidation persists. No
pneumothorax or pulmonary edema. No definite effusion. No acute
osseous abnormality identified.
IMPRESSION: 1. Stable lines and tubes.
2. Stable left lower lobe atelectasis or consolidation.

## 2021-05-06 MED ORDER — SODIUM CHLORIDE 0.9 % IV SOLN
1000.0000 mg | Freq: Once | INTRAVENOUS | Status: AC
  Administered 2021-05-07: 1000 mg via INTRAVENOUS
  Filled 2021-05-06: qty 8

## 2021-05-06 MED ORDER — SODIUM CHLORIDE 0.9 % IV SOLN
1000.0000 mg | Freq: Once | INTRAVENOUS | Status: DC
  Filled 2021-05-06: qty 8

## 2021-05-06 MED ORDER — INSULIN REGULAR(HUMAN) IN NACL 100-0.9 UT/100ML-% IV SOLN
INTRAVENOUS | Status: DC
  Administered 2021-05-06: 17 [IU]/h via INTRAVENOUS
  Filled 2021-05-06 (×2): qty 100

## 2021-05-06 MED ORDER — FUROSEMIDE 10 MG/ML IJ SOLN
20.0000 mg | Freq: Once | INTRAMUSCULAR | Status: AC
  Administered 2021-05-06: 20 mg via INTRAVENOUS
  Filled 2021-05-06: qty 2

## 2021-05-06 MED ORDER — PIPERACILLIN-TAZOBACTAM 3.375 G IVPB 30 MIN
3.3750 g | Freq: Once | INTRAVENOUS | Status: AC
  Administered 2021-05-07: 3.375 g via INTRAVENOUS
  Filled 2021-05-06 (×2): qty 50

## 2021-05-06 MED ORDER — SODIUM CHLORIDE 0.45 % IV SOLN: INTRAVENOUS | Status: DC

## 2021-05-06 MED ORDER — LIDOCAINE HCL (PF) 1 % IJ SOLN
INTRAMUSCULAR | Status: AC
  Filled 2021-05-06: qty 30

## 2021-05-06 MED ORDER — ALBUMIN HUMAN 25 % IV SOLN
12.5000 g | Freq: Once | INTRAVENOUS | Status: AC
  Administered 2021-05-06: 12.5 g via INTRAVENOUS
  Filled 2021-05-06: qty 50

## 2021-05-06 MED ORDER — DEXTROSE 50 % IV SOLN: 0.0000 mL | INTRAVENOUS | Status: DC | PRN

## 2021-05-06 MED FILL — Medication: Qty: 1 | Status: AC

## 2021-05-06 NOTE — Progress Notes (Signed)
   05/06/21 0747  Airway 7 mm  Placement Date/Time: 04/29/2021 (c) 1635   Grade View: Grade 2  Airway Device: Endotracheal Tube  Laryngoscope Blade: Miller;2  ETT Types: Oral  Size (mm): 7 mm  Cuffed: Cuffed;Min.occ.pres.  Insertion attempts: 1  Airway Equipment: Stylet  Placement Co...  Secured at (cm) 21 cm  Measured From Lips  Secured Location Left  Secured By Actuary Repositioned Yes  Prone position No  Cuff Pressure (cm H2O) MOV (Manual Technique)  Site Condition Dry  Adult Ventilator Settings  Vent Type Servo i  Humidity HME  Vent Mode PRVC  Vt Set 420 mL  Set Rate 16 bmp  FiO2 (%) 30 %  I Time 0.8 Sec(s)  PEEP 5 cmH20  Adult Ventilator Measurements  Peak Airway Pressure 21 L/min  Mean Airway Pressure 8 cmH20  Plateau Pressure 17 cmH20  Resp Rate Spontaneous 0 br/min  Resp Rate Total 16 br/min  Exhaled Vt 426 mL  Measured Ve 6.7 mL  I:E Ratio Measured 1:3.6  Auto PEEP 0 cmH20  Total PEEP 5 cmH20  SpO2 97 %  Adult Ventilator Alarms  Alarms On Y  Ve High Alarm 18 L/min  Ve Low Alarm 4 L/min  Resp Rate High Alarm 38 br/min  Resp Rate Low Alarm 10  PEEP Low Alarm 3 cmH2O  Press High Alarm 45 cmH2O  VAP Prevention  HME changed No  Ventilator changed No  Transported while on vent No  HOB> 30 Degrees Y  Daily Weaning Assessment  Daily Assessment of Readiness to Wean Wean protocol criteria not met  Reason not met Apnea  Breath Sounds  Bilateral Breath Sounds Clear  Airway Suctioning/Secretions  Suction Type ETT  Suction Device  Catheter  Secretion Amount None  Suction Tolerance Tolerated well  Suctioning Adverse Effects None

## 2021-05-06 NOTE — CV Procedure (Signed)
Interventional Radiology Procedure Note  Procedure: US guided random liver biopsy  Complications: None  Estimated Blood Loss: None  Recommendations: - Path submitted   Signed,  Criselda Peaches, MD

## 2021-05-06 NOTE — Progress Notes (Signed)
Recruitment maneuver performed per doner services, ABG will be obtained on 100% in 30 min

## 2021-05-06 NOTE — Progress Notes (Signed)
Recruitment maneuver performed, abg will be drawn in 30 min.

## 2021-05-07 ENCOUNTER — Other Ambulatory Visit (HOSPITAL_COMMUNITY): Payer: 59

## 2021-05-07 ENCOUNTER — Inpatient Hospital Stay (HOSPITAL_COMMUNITY): Payer: 59 | Admitting: Anesthesiology

## 2021-05-07 ENCOUNTER — Encounter (HOSPITAL_COMMUNITY): Payer: 59 | Admitting: Anesthesiology

## 2021-05-07 ENCOUNTER — Encounter (HOSPITAL_COMMUNITY): Admission: RE | Disposition: E | Payer: Self-pay | Source: Home / Self Care | Attending: Neurological Surgery

## 2021-05-07 DIAGNOSIS — Z7984 Long term (current) use of oral hypoglycemic drugs: Secondary | ICD-10-CM | POA: Diagnosis not present

## 2021-05-07 DIAGNOSIS — E119 Type 2 diabetes mellitus without complications: Secondary | ICD-10-CM | POA: Diagnosis not present

## 2021-05-07 DIAGNOSIS — G9382 Brain death: Secondary | ICD-10-CM | POA: Diagnosis not present

## 2021-05-07 DIAGNOSIS — Z5289 Donor of other specified organs or tissues: Secondary | ICD-10-CM | POA: Diagnosis not present

## 2021-05-07 DIAGNOSIS — J9601 Acute respiratory failure with hypoxia: Secondary | ICD-10-CM | POA: Diagnosis not present

## 2021-05-07 DIAGNOSIS — Z0389 Encounter for observation for other suspected diseases and conditions ruled out: Secondary | ICD-10-CM | POA: Diagnosis not present

## 2021-05-07 HISTORY — PX: ORGAN PROCUREMENT: SHX5270

## 2021-05-07 LAB — URINE CULTURE: Culture: NO GROWTH

## 2021-05-07 LAB — SURGICAL PATHOLOGY

## 2021-05-07 IMAGING — DX DG OR LOCAL ABDOMEN
1 series · 1 of 1 positions shown · non-contrast
Comparison: Abdominal radiograph [DATE].

CLINICAL DATA: 64-year-old female status post organ procurement.
Evaluate for retained instruments.

EXAM:
OR LOCAL ABDOMEN

[abdomen]
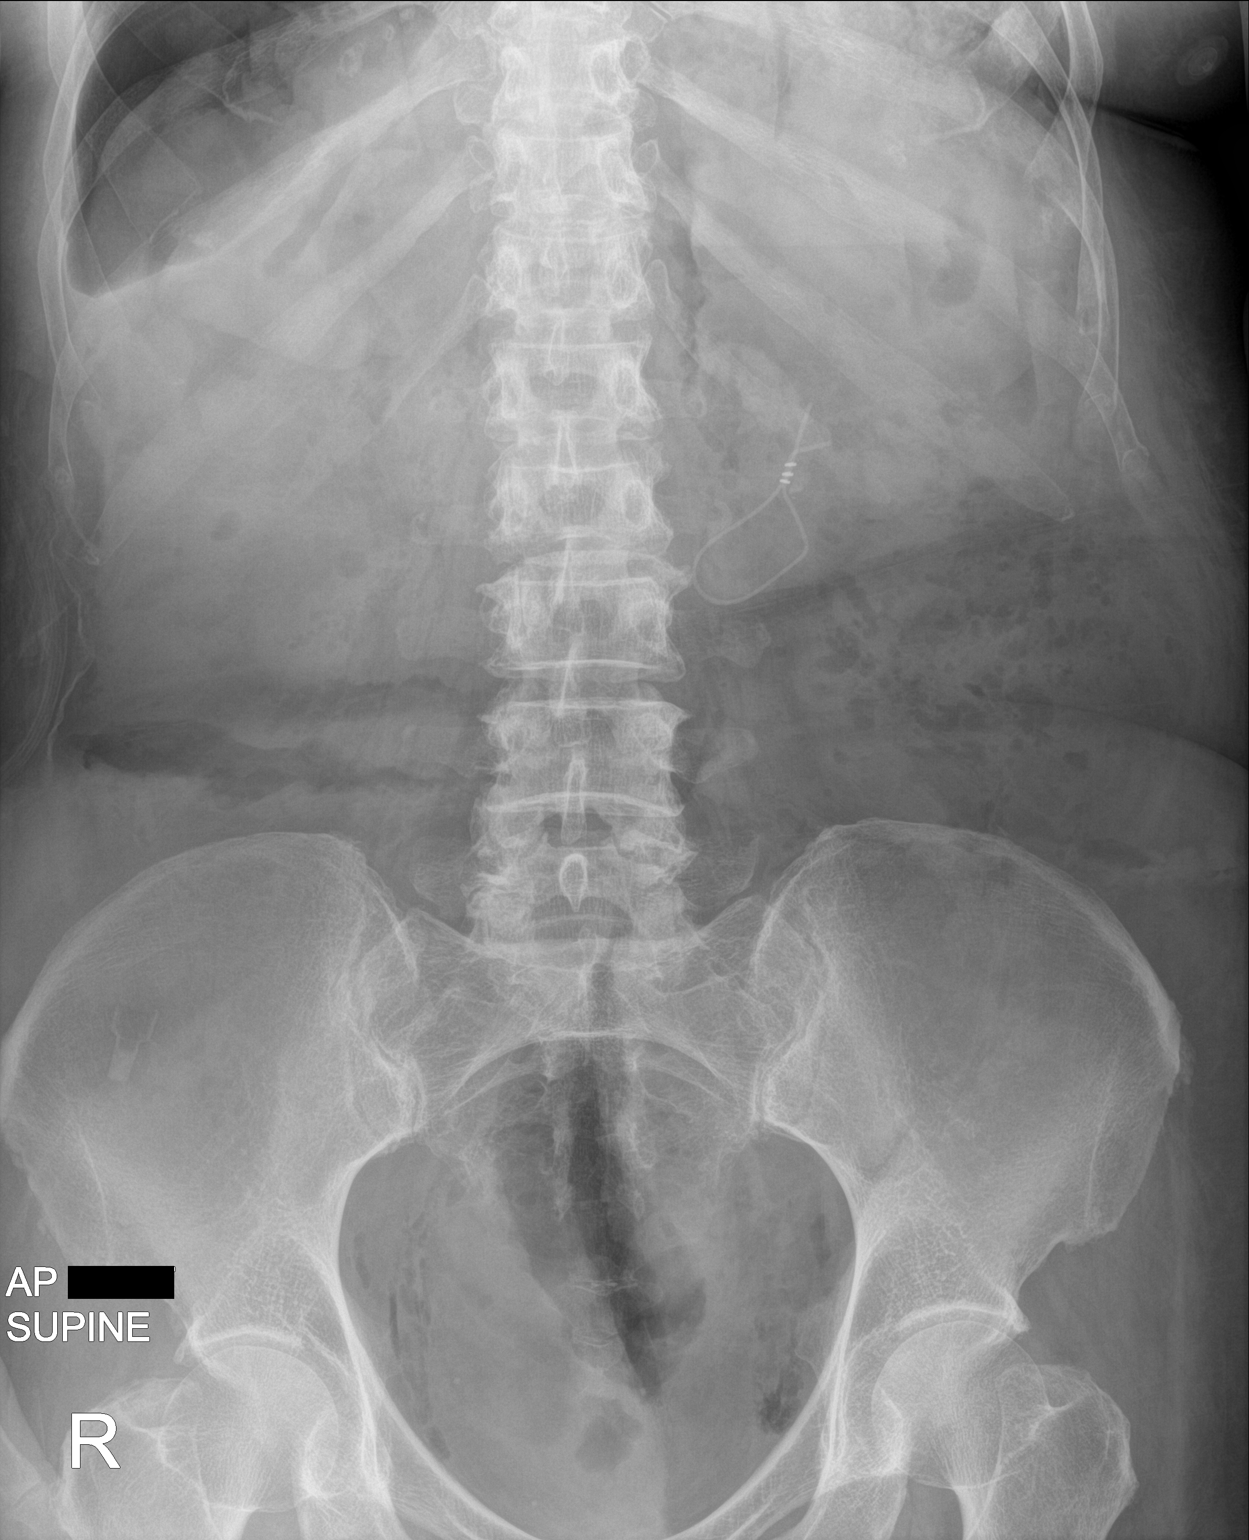

[1 of 1 positions shown; findings below may reference images not displayed]

FINDINGS: The bowel gas pattern is normal. No radio-opaque calculi are seen.
There is a radiopaque structure in the left side of the mid abdomen
which has an appearance suggestive of a cerclage wire. This is of
uncertain etiology and significance.
IMPRESSION: 1. Radiopaque structure in the left side of the abdomen of uncertain
etiology and significance.

These results were called by telephone at the time of interpretation
who verbally acknowledged these results.

## 2021-05-07 SURGERY — SURGICAL PROCUREMENT, ORGAN
Anesthesia: General | Site: Abdomen

## 2021-05-07 MED ORDER — PHENYLEPHRINE HCL-NACL 10-0.9 MG/250ML-% IV SOLN
INTRAVENOUS | Status: DC | PRN
  Administered 2021-05-07: 50 ug/min via INTRAVENOUS

## 2021-05-07 MED ORDER — 0.9 % SODIUM CHLORIDE (POUR BTL) OPTIME
TOPICAL | Status: DC | PRN
  Administered 2021-05-07 (×4): 1000 mL

## 2021-05-07 MED ORDER — STERILE WATER FOR IRRIGATION IR SOLN
Status: DC | PRN
  Administered 2021-05-07: 1000 mL

## 2021-05-07 MED ORDER — HEPARIN SODIUM (PORCINE) 1000 UNIT/ML IJ SOLN
INTRAMUSCULAR | Status: AC
  Filled 2021-05-07: qty 1

## 2021-05-07 MED ORDER — HEPARIN SODIUM (PORCINE) 1000 UNIT/ML IJ SOLN
INTRAMUSCULAR | Status: DC | PRN
  Administered 2021-05-07: 30000 [IU] via INTRAVENOUS

## 2021-05-07 MED ORDER — ROCURONIUM 10MG/ML (10ML) SYRINGE FOR MEDFUSION PUMP - OPTIME
INTRAVENOUS | Status: DC | PRN
  Administered 2021-05-07: 50 mg via INTRAVENOUS

## 2021-05-07 MED ORDER — SODIUM CHLORIDE 0.9 % IV SOLN: INTRAVENOUS | Status: DC | PRN

## 2021-05-07 MED ORDER — ROCURONIUM BROMIDE 10 MG/ML (PF) SYRINGE
PREFILLED_SYRINGE | INTRAVENOUS | Status: AC
  Filled 2021-05-07: qty 10

## 2021-05-07 SURGICAL SUPPLY — 89 items
APPLIER CLIP 11 MED OPEN (CLIP) ×2
APR CLP MED 11 20 MLT OPN (CLIP) ×1
BLADE CLIPPER SURG (BLADE) IMPLANT
BLADE SAW STERNAL (BLADE) ×2 IMPLANT
BLADE SURG 10 STRL SS (BLADE) IMPLANT
CLIP APPLIE 11 MED OPEN (CLIP) IMPLANT
CLIP VESOCCLUDE MED 24/CT (CLIP) IMPLANT
CLIP VESOCCLUDE SM WIDE 24/CT (CLIP) IMPLANT
CNTNR URN SCR LID CUP LEK RST (MISCELLANEOUS) ×1 IMPLANT
CONT SPEC 4OZ STRL OR WHT (MISCELLANEOUS) ×8
COVER BACK TABLE 60X90IN (DRAPES) IMPLANT
COVER MAYO STAND STRL (DRAPES) IMPLANT
COVER SURGICAL LIGHT HANDLE (MISCELLANEOUS) ×1 IMPLANT
COVER WAND RF STERILE (DRAPES) ×1 IMPLANT
DRAPE HALF SHEET 40X57 (DRAPES) IMPLANT
DRAPE SLUSH MACHINE 52X66 (DRAPES) ×2 IMPLANT
DRSG COVADERM 4X10 (GAUZE/BANDAGES/DRESSINGS) ×4 IMPLANT
DRSG TELFA 3X8 NADH (GAUZE/BANDAGES/DRESSINGS) ×4 IMPLANT
DURAPREP 26ML APPLICATOR (WOUND CARE) ×1 IMPLANT
ELECT BLADE 6.5 EXT (BLADE) IMPLANT
ELECT CAUTERY BLADE 6.4 (BLADE) ×1 IMPLANT
ELECT REM PT RETURN 9FT ADLT (ELECTROSURGICAL) ×4
ELECTRODE REM PT RTRN 9FT ADLT (ELECTROSURGICAL) ×2 IMPLANT
GAUZE 4X4 16PLY RFD (DISPOSABLE) IMPLANT
GLOVE BIO SURGEON STRL SZ7 (GLOVE) IMPLANT
GLOVE BIO SURGEON STRL SZ7.5 (GLOVE) IMPLANT
GLOVE BIO SURGEON STRL SZ8 (GLOVE) IMPLANT
GLOVE BIO SURGEON STRL SZ8.5 (GLOVE) IMPLANT
GLOVE BIOGEL PI IND STRL 7.5 (GLOVE) IMPLANT
GLOVE BIOGEL PI IND STRL 8.5 (GLOVE) IMPLANT
GLOVE BIOGEL PI INDICATOR 7.5 (GLOVE)
GLOVE BIOGEL PI INDICATOR 8.5 (GLOVE)
GLOVE SRG 8 PF TXTR STRL LF DI (GLOVE) IMPLANT
GLOVE SURG POLYISO LF SZ7 (GLOVE) ×2 IMPLANT
GLOVE SURG POLYISO LF SZ7.5 (GLOVE) ×2 IMPLANT
GLOVE SURG SS PI 8.0 STRL IVOR (GLOVE) IMPLANT
GLOVE SURG UNDER POLY LF SZ7 (GLOVE) IMPLANT
GLOVE SURG UNDER POLY LF SZ8 (GLOVE) ×2
GOWN STRL REUS W/ TWL LRG LVL3 (GOWN DISPOSABLE) ×4 IMPLANT
GOWN STRL REUS W/ TWL XL LVL3 (GOWN DISPOSABLE) ×2 IMPLANT
GOWN STRL REUS W/TWL LRG LVL3 (GOWN DISPOSABLE) ×8
GOWN STRL REUS W/TWL XL LVL3 (GOWN DISPOSABLE) ×2
HANDLE SUCTION POOLE (INSTRUMENTS) IMPLANT
KIT POST MORTEM ADULT 36X90 (BAG) ×2 IMPLANT
KIT TURNOVER KIT B (KITS) ×2 IMPLANT
LOOP VESSEL MAXI BLUE (MISCELLANEOUS) IMPLANT
LOOP VESSEL MINI RED (MISCELLANEOUS) IMPLANT
MANIFOLD NEPTUNE II (INSTRUMENTS) ×2 IMPLANT
NDL BIOPSY 14X6 SOFT TISS (NEEDLE) IMPLANT
NEEDLE BIOPSY 14X6 SOFT TISS (NEEDLE) ×2 IMPLANT
NS IRRIG 1000ML POUR BTL (IV SOLUTION) ×4 IMPLANT
PACK AORTA (CUSTOM PROCEDURE TRAY) ×2 IMPLANT
PAD ARMBOARD 7.5X6 YLW CONV (MISCELLANEOUS) ×3 IMPLANT
PAD DRESSING TELFA 3X8 NADH (GAUZE/BANDAGES/DRESSINGS) ×1 IMPLANT
PENCIL BUTTON HOLSTER BLD 10FT (ELECTRODE) ×2 IMPLANT
SOL PREP POV-IOD 4OZ 10% (MISCELLANEOUS) ×2 IMPLANT
SPONGE INTESTINAL PEANUT (DISPOSABLE) IMPLANT
SPONGE LAP 18X18 RF (DISPOSABLE) IMPLANT
STAPLER VISISTAT 35W (STAPLE) ×2 IMPLANT
SUCTION POOLE HANDLE (INSTRUMENTS) ×4
SUT BONE WAX W31G (SUTURE) IMPLANT
SUT ETHIBOND 5 LR DA (SUTURE) IMPLANT
SUT ETHILON 1 LR 30 (SUTURE) ×8 IMPLANT
SUT ETHILON 2 LR (SUTURE) IMPLANT
SUT PROLENE 3 0 RB 1 (SUTURE) IMPLANT
SUT PROLENE 3 0 SH 1 (SUTURE) IMPLANT
SUT PROLENE 4 0 RB 1 (SUTURE)
SUT PROLENE 4 0 SH DA (SUTURE) ×1 IMPLANT
SUT PROLENE 4-0 RB1 .5 CRCL 36 (SUTURE) IMPLANT
SUT PROLENE 5 0 C 1 24 (SUTURE) IMPLANT
SUT PROLENE 6 0 BV (SUTURE) IMPLANT
SUT SILK 0 TIES 10X30 (SUTURE) IMPLANT
SUT SILK 1 SH (SUTURE) IMPLANT
SUT SILK 1 TIES 10X30 (SUTURE) IMPLANT
SUT SILK 2 0 (SUTURE)
SUT SILK 2 0 SH (SUTURE) IMPLANT
SUT SILK 2 0 SH CR/8 (SUTURE) ×1 IMPLANT
SUT SILK 2 0 TIES 10X30 (SUTURE) IMPLANT
SUT SILK 2-0 18XBRD TIE 12 (SUTURE) IMPLANT
SUT SILK 3 0 SH CR/8 (SUTURE) IMPLANT
SUT SILK 3 0 TIES 10X30 (SUTURE) IMPLANT
SWAB COLLECTION DEVICE MRSA (MISCELLANEOUS) IMPLANT
SWAB CULTURE ESWAB REG 1ML (MISCELLANEOUS) IMPLANT
SYR 50ML LL SCALE MARK (SYRINGE) IMPLANT
SYRINGE TOOMEY DISP (SYRINGE) IMPLANT
TAPE UMBILICAL 1/8 X36 TWILL (MISCELLANEOUS) IMPLANT
TUBE CONNECTING 12X1/4 (SUCTIONS) ×4 IMPLANT
WATER STERILE IRR 1000ML POUR (IV SOLUTION) ×1 IMPLANT
YANKAUER SUCT BULB TIP NO VENT (SUCTIONS) ×1 IMPLANT

## 2021-05-08 LAB — CULTURE, BAL-QUANTITATIVE W GRAM STAIN
Culture: 1000 — AB
Special Requests: NORMAL

## 2021-05-09 LAB — BPAM RBC
Blood Product Expiration Date: 202207012359
Blood Product Expiration Date: 202207012359
Blood Product Expiration Date: 202207072359
Blood Product Expiration Date: 202207072359
Unit Type and Rh: 5100
Unit Type and Rh: 5100
Unit Type and Rh: 5100
Unit Type and Rh: 5100

## 2021-05-09 LAB — TYPE AND SCREEN
ABO/RH(D): O POS
Antibody Screen: NEGATIVE
Unit division: 0
Unit division: 0
Unit division: 0
Unit division: 0

## 2021-05-10 ENCOUNTER — Encounter (HOSPITAL_COMMUNITY): Payer: Self-pay

## 2021-05-10 LAB — SURGICAL PATHOLOGY

## 2021-05-11 LAB — CULTURE, BLOOD (ROUTINE X 2)
Culture: NO GROWTH
Culture: NO GROWTH
Special Requests: ADEQUATE

## 2021-05-12 LAB — CULTURE, BLOOD (ROUTINE X 2): Special Requests: ADEQUATE

## 2021-05-20 ENCOUNTER — Institutional Professional Consult (permissible substitution): Payer: Medicare HMO | Admitting: Neurology

## 2021-05-25 ENCOUNTER — Ambulatory Visit (INDEPENDENT_AMBULATORY_CARE_PROVIDER_SITE_OTHER): Payer: Medicare HMO | Admitting: Otolaryngology

## 2021-06-04 NOTE — Transfer of Care (Signed)
Immediate Anesthesia Transfer of Care Note  Patient: Tammy Hernandez  Procedure(s) Performed: ORGAN PROCUREMENT (KIDNEY & LIVER) (N/A Abdomen)

## 2021-06-04 NOTE — Anesthesia Preprocedure Evaluation (Signed)
Anesthesia Evaluation  Patient identified by MRN, date of birth, ID band Patient unresponsive    Reviewed: Allergy & Precautions, NPO status , Patient's Chart, lab work & pertinent test results, Unable to perform ROS - Chart review only  History of Anesthesia Complications (+) PONV and history of anesthetic complications  Airway Mallampati: Intubated       Dental   Pulmonary former smoker,     + decreased breath sounds  + intubated    Cardiovascular Normal cardiovascular exam     Neuro/Psych  Brain death     GI/Hepatic   Endo/Other  diabetes, Type 2, Oral Hypoglycemic AgentsHypothyroidism  Hypernatremia Hypocalcemia Hyperchloremia   Renal/GU      Musculoskeletal  (+) Arthritis ,   Abdominal   Peds  Hematology  (+) anemia ,   Anesthesia Other Findings   Reproductive/Obstetrics                             Anesthesia Physical Anesthesia Plan  ASA: VI  Anesthesia Plan: General   Post-op Pain Management:    Induction: Inhalational  PONV Risk Score and Plan: 4 or greater and Treatment may vary due to age or medical condition  Airway Management Planned: Oral ETT  Additional Equipment:   Intra-op Plan:   Post-operative Plan: Post-operative intubation/ventilation  Informed Consent:     History available from chart only  Plan Discussed with: CRNA and Anesthesiologist  Anesthesia Plan Comments:         Anesthesia Quick Evaluation

## 2021-06-04 NOTE — Anesthesia Postprocedure Evaluation (Signed)
Anesthesia Post Note  Patient: Tammy Hernandez  Procedure(s) Performed: ORGAN PROCUREMENT (KIDNEY & LIVER) (N/A Abdomen)     Patient location during evaluation: Other Anesthesia Type: General Level of consciousness: comatose Vital Signs Assessment: post-procedure vital signs reviewed and stable Respiratory status: patient remains intubated per anesthesia plan Cardiovascular status: stable Anesthetic complications: no   No complications documented.  Last Vitals:  Vitals:   05/09/2021 0000 05-09-2021 0100  BP: (!) 101/53 (!) 106/54  Pulse: 86 83  Resp: (!) 26 16  Temp: 36.6 C   SpO2: 91% 95%    Last Pain:  Vitals:   05/09/21 0000  TempSrc: Axillary  PainSc:                  Tammy Hernandez

## 2021-06-04 DEATH — deceased

## 2021-06-21 ENCOUNTER — Other Ambulatory Visit: Payer: Self-pay | Admitting: Neurology

## 2021-06-29 ENCOUNTER — Ambulatory Visit: Payer: Medicare HMO | Admitting: Neurology
